# Patient Record
Sex: Female | Born: 1955
Health system: Southern US, Community
[De-identification: ages and names within clinical notes are randomized; demographics above are authoritative.]

## PROBLEM LIST (undated history)

## (undated) DIAGNOSIS — C4490 Unspecified malignant neoplasm of skin, unspecified: Secondary | ICD-10-CM

## (undated) DIAGNOSIS — I10 Essential (primary) hypertension: Secondary | ICD-10-CM

## (undated) DIAGNOSIS — E785 Hyperlipidemia, unspecified: Secondary | ICD-10-CM

## (undated) DIAGNOSIS — D649 Anemia, unspecified: Secondary | ICD-10-CM

## (undated) HISTORY — DX: Essential (primary) hypertension: I10

## (undated) HISTORY — DX: Anemia, unspecified: D64.9

## (undated) HISTORY — PX: ABDOMINAL HYSTERECTOMY: SHX81

## (undated) HISTORY — PX: KNEE SURGERY: SHX244

## (undated) HISTORY — PX: EYE SURGERY: SHX253

## (undated) HISTORY — DX: Hyperlipidemia, unspecified: E78.5

## (undated) HISTORY — DX: Unspecified malignant neoplasm of skin, unspecified: C44.90

---

## 1998-11-25 ENCOUNTER — Ambulatory Visit (HOSPITAL_COMMUNITY): Admission: RE | Admit: 1998-11-25 | Discharge: 1998-11-25 | Payer: Self-pay | Admitting: Obstetrics and Gynecology

## 1998-11-25 ENCOUNTER — Encounter: Payer: Self-pay | Admitting: Obstetrics and Gynecology

## 1999-11-28 ENCOUNTER — Encounter: Payer: Self-pay | Admitting: Obstetrics & Gynecology

## 1999-11-28 ENCOUNTER — Ambulatory Visit (HOSPITAL_COMMUNITY): Admission: RE | Admit: 1999-11-28 | Discharge: 1999-11-28 | Payer: Self-pay | Admitting: Obstetrics & Gynecology

## 2000-02-10 ENCOUNTER — Other Ambulatory Visit: Admission: RE | Admit: 2000-02-10 | Discharge: 2000-02-10 | Payer: Self-pay | Admitting: Obstetrics & Gynecology

## 2000-12-14 ENCOUNTER — Ambulatory Visit (HOSPITAL_COMMUNITY): Admission: RE | Admit: 2000-12-14 | Discharge: 2000-12-14 | Payer: Self-pay | Admitting: Obstetrics & Gynecology

## 2000-12-14 ENCOUNTER — Encounter: Payer: Self-pay | Admitting: Obstetrics & Gynecology

## 2001-02-03 ENCOUNTER — Encounter: Admission: RE | Admit: 2001-02-03 | Discharge: 2001-02-03 | Payer: Self-pay | Admitting: Urology

## 2001-02-03 ENCOUNTER — Encounter: Payer: Self-pay | Admitting: Urology

## 2001-05-24 ENCOUNTER — Other Ambulatory Visit: Admission: RE | Admit: 2001-05-24 | Discharge: 2001-05-24 | Payer: Self-pay | Admitting: Family Medicine

## 2001-09-30 ENCOUNTER — Encounter (INDEPENDENT_AMBULATORY_CARE_PROVIDER_SITE_OTHER): Payer: Self-pay | Admitting: Specialist

## 2001-09-30 ENCOUNTER — Ambulatory Visit (HOSPITAL_COMMUNITY): Admission: RE | Admit: 2001-09-30 | Discharge: 2001-09-30 | Payer: Self-pay | Admitting: Urology

## 2001-12-19 ENCOUNTER — Ambulatory Visit (HOSPITAL_COMMUNITY): Admission: RE | Admit: 2001-12-19 | Discharge: 2001-12-19 | Payer: Self-pay | Admitting: Obstetrics & Gynecology

## 2001-12-19 ENCOUNTER — Encounter: Payer: Self-pay | Admitting: Obstetrics & Gynecology

## 2002-03-09 ENCOUNTER — Encounter (INDEPENDENT_AMBULATORY_CARE_PROVIDER_SITE_OTHER): Payer: Self-pay

## 2002-03-09 ENCOUNTER — Observation Stay (HOSPITAL_COMMUNITY): Admission: RE | Admit: 2002-03-09 | Discharge: 2002-03-10 | Payer: Self-pay | Admitting: Obstetrics & Gynecology

## 2002-12-14 ENCOUNTER — Other Ambulatory Visit: Admission: RE | Admit: 2002-12-14 | Discharge: 2002-12-14 | Payer: Self-pay | Admitting: Obstetrics & Gynecology

## 2003-04-30 ENCOUNTER — Encounter: Payer: Self-pay | Admitting: Emergency Medicine

## 2003-04-30 ENCOUNTER — Emergency Department (HOSPITAL_COMMUNITY): Admission: EM | Admit: 2003-04-30 | Discharge: 2003-05-01 | Payer: Self-pay | Admitting: Emergency Medicine

## 2003-12-17 ENCOUNTER — Other Ambulatory Visit: Admission: RE | Admit: 2003-12-17 | Discharge: 2003-12-17 | Payer: Self-pay | Admitting: Obstetrics & Gynecology

## 2005-01-14 ENCOUNTER — Other Ambulatory Visit: Admission: RE | Admit: 2005-01-14 | Discharge: 2005-01-14 | Payer: Self-pay | Admitting: Obstetrics & Gynecology

## 2006-04-07 ENCOUNTER — Ambulatory Visit: Payer: Self-pay | Admitting: Family Medicine

## 2006-07-05 ENCOUNTER — Ambulatory Visit: Payer: Self-pay | Admitting: Family Medicine

## 2006-08-11 ENCOUNTER — Ambulatory Visit: Payer: Self-pay | Admitting: Family Medicine

## 2006-10-27 DIAGNOSIS — I10 Essential (primary) hypertension: Secondary | ICD-10-CM | POA: Insufficient documentation

## 2006-10-27 DIAGNOSIS — G4700 Insomnia, unspecified: Secondary | ICD-10-CM | POA: Insufficient documentation

## 2006-10-27 DIAGNOSIS — D649 Anemia, unspecified: Secondary | ICD-10-CM | POA: Insufficient documentation

## 2006-10-27 DIAGNOSIS — E785 Hyperlipidemia, unspecified: Secondary | ICD-10-CM | POA: Insufficient documentation

## 2006-11-08 ENCOUNTER — Ambulatory Visit: Payer: Self-pay | Admitting: Family Medicine

## 2006-11-08 LAB — CONVERTED CEMR LAB
ALT: 20 units/L (ref 0–40)
AST: 27 units/L (ref 0–37)
Cholesterol: 248 mg/dL (ref 0–200)
Direct LDL: 163.8 mg/dL
HDL: 66.9 mg/dL (ref >39.0)
Total CHOL/HDL Ratio: 3.7
Triglycerides: 164 mg/dL — ABNORMAL HIGH (ref 0–149)
VLDL: 33 mg/dL (ref 0–40)

## 2007-01-18 ENCOUNTER — Ambulatory Visit: Payer: Self-pay | Admitting: Family Medicine

## 2007-01-18 LAB — CONVERTED CEMR LAB
ALT: 25 units/L (ref 0–40)
AST: 29 units/L (ref 0–37)
Cholesterol: 188 mg/dL (ref 0–200)
HDL: 63.5 mg/dL (ref >39.0)
LDL Cholesterol: 90 mg/dL (ref 0–99)
Total CHOL/HDL Ratio: 3
Triglycerides: 175 mg/dL — ABNORMAL HIGH (ref 0–149)
VLDL: 35 mg/dL (ref 0–40)

## 2007-03-16 ENCOUNTER — Ambulatory Visit: Payer: Self-pay | Admitting: Family Medicine

## 2007-03-17 ENCOUNTER — Telehealth (INDEPENDENT_AMBULATORY_CARE_PROVIDER_SITE_OTHER): Payer: Self-pay | Admitting: *Deleted

## 2007-03-17 LAB — CONVERTED CEMR LAB
ALT: 18 units/L (ref 0–40)
AST: 25 units/L (ref 0–37)
BUN: 18 mg/dL (ref 6–23)
CO2: 29 meq/L (ref 19–32)
Calcium: 9.2 mg/dL (ref 8.4–10.5)
Chloride: 107 meq/L (ref 96–112)
Cholesterol: 203 mg/dL (ref 0–200)
Creatinine, Ser: 0.6 mg/dL (ref 0.4–1.2)
Direct LDL: 104.1 mg/dL
GFR calc Af Amer: 136 mL/min
GFR calc non Af Amer: 112 mL/min
Glucose, Bld: 84 mg/dL (ref 70–99)
HDL: 63.6 mg/dL (ref >39.0)
Potassium: 4 meq/L (ref 3.5–5.1)
Sodium: 142 meq/L (ref 135–145)
Total CHOL/HDL Ratio: 3.2
Triglycerides: 151 mg/dL — ABNORMAL HIGH (ref 0–149)
VLDL: 30 mg/dL (ref 0–40)

## 2007-07-13 ENCOUNTER — Ambulatory Visit: Payer: Self-pay | Admitting: Family Medicine

## 2007-07-26 ENCOUNTER — Ambulatory Visit: Payer: Self-pay | Admitting: Family Medicine

## 2007-08-08 ENCOUNTER — Telehealth (INDEPENDENT_AMBULATORY_CARE_PROVIDER_SITE_OTHER): Payer: Self-pay | Admitting: *Deleted

## 2007-11-11 ENCOUNTER — Telehealth (INDEPENDENT_AMBULATORY_CARE_PROVIDER_SITE_OTHER): Payer: Self-pay | Admitting: *Deleted

## 2007-11-16 ENCOUNTER — Ambulatory Visit: Payer: Self-pay | Admitting: Family Medicine

## 2007-11-16 ENCOUNTER — Encounter (INDEPENDENT_AMBULATORY_CARE_PROVIDER_SITE_OTHER): Payer: Self-pay | Admitting: *Deleted

## 2007-11-16 LAB — CONVERTED CEMR LAB
ALT: 17 units/L (ref 0–35)
AST: 22 units/L (ref 0–37)
BUN: 15 mg/dL (ref 6–23)
CO2: 27 meq/L (ref 19–32)
Calcium: 8.9 mg/dL (ref 8.4–10.5)
Chloride: 105 meq/L (ref 96–112)
Cholesterol: 171 mg/dL (ref 0–200)
Creatinine, Ser: 0.7 mg/dL (ref 0.4–1.2)
GFR calc Af Amer: 113 mL/min
GFR calc non Af Amer: 93 mL/min
Glucose, Bld: 88 mg/dL (ref 70–99)
HDL: 60.2 mg/dL (ref >39.0)
LDL Cholesterol: 82 mg/dL (ref 0–99)
Potassium: 3.9 meq/L (ref 3.5–5.1)
Sodium: 139 meq/L (ref 135–145)
Total CHOL/HDL Ratio: 2.8
Triglycerides: 143 mg/dL (ref 0–149)
VLDL: 29 mg/dL (ref 0–40)

## 2007-11-22 ENCOUNTER — Telehealth (INDEPENDENT_AMBULATORY_CARE_PROVIDER_SITE_OTHER): Payer: Self-pay | Admitting: *Deleted

## 2007-12-14 ENCOUNTER — Encounter (INDEPENDENT_AMBULATORY_CARE_PROVIDER_SITE_OTHER): Payer: Self-pay | Admitting: *Deleted

## 2008-03-21 ENCOUNTER — Ambulatory Visit: Payer: Self-pay | Admitting: Family Medicine

## 2008-03-27 LAB — CONVERTED CEMR LAB
ALT: 18 units/L (ref 0–35)
AST: 24 units/L (ref 0–37)
Albumin: 3.7 g/dL (ref 3.5–5.2)
Alkaline Phosphatase: 55 units/L (ref 39–117)
BUN: 18 mg/dL (ref 6–23)
Basophils Absolute: 0 10*3/uL (ref 0.0–0.1)
Basophils Relative: 0.9 % (ref 0.0–1.0)
Bilirubin, Direct: 0.1 mg/dL (ref 0.0–0.3)
CO2: 27 meq/L (ref 19–32)
Calcium: 8.8 mg/dL (ref 8.4–10.5)
Chloride: 103 meq/L (ref 96–112)
Creatinine, Ser: 0.6 mg/dL (ref 0.4–1.2)
Eosinophils Absolute: 0.1 10*3/uL (ref 0.0–0.7)
Eosinophils Relative: 1.7 % (ref 0.0–5.0)
Ferritin: 75.7 ng/mL (ref 10.0–291.0)
GFR calc Af Amer: 135 mL/min
GFR calc non Af Amer: 112 mL/min
Glucose, Bld: 86 mg/dL (ref 70–99)
HCT: 34.8 % — ABNORMAL LOW (ref 36.0–46.0)
Hemoglobin: 12.1 g/dL (ref 12.0–15.0)
Iron: 85 ug/dL (ref 42–145)
Lymphocytes Relative: 32.8 % (ref 12.0–46.0)
MCHC: 34.7 g/dL (ref 30.0–36.0)
MCV: 95 fL (ref 78.0–100.0)
Monocytes Absolute: 0.4 10*3/uL (ref 0.1–1.0)
Monocytes Relative: 9.3 % (ref 3.0–12.0)
Neutro Abs: 2.5 10*3/uL (ref 1.4–7.7)
Neutrophils Relative %: 55.3 % (ref 43.0–77.0)
Platelets: 255 10*3/uL (ref 150–400)
Potassium: 3.9 meq/L (ref 3.5–5.1)
RBC: 3.66 M/uL — ABNORMAL LOW (ref 3.87–5.11)
RDW: 12.6 % (ref 11.5–14.6)
Saturation Ratios: 19.9 % — ABNORMAL LOW (ref 20.0–50.0)
Sodium: 138 meq/L (ref 135–145)
Total Bilirubin: 0.7 mg/dL (ref 0.3–1.2)
Total Protein: 7 g/dL (ref 6.0–8.3)
Transferrin: 304.4 mg/dL (ref >=212.0)
WBC: 4.4 10*3/uL — ABNORMAL LOW (ref 4.5–10.5)

## 2008-03-28 ENCOUNTER — Encounter (INDEPENDENT_AMBULATORY_CARE_PROVIDER_SITE_OTHER): Payer: Self-pay | Admitting: *Deleted

## 2008-07-20 ENCOUNTER — Ambulatory Visit: Payer: Self-pay | Admitting: Family Medicine

## 2008-11-01 ENCOUNTER — Telehealth (INDEPENDENT_AMBULATORY_CARE_PROVIDER_SITE_OTHER): Payer: Self-pay | Admitting: *Deleted

## 2008-12-10 ENCOUNTER — Telehealth (INDEPENDENT_AMBULATORY_CARE_PROVIDER_SITE_OTHER): Payer: Self-pay | Admitting: *Deleted

## 2008-12-17 ENCOUNTER — Ambulatory Visit: Payer: Self-pay | Admitting: Family Medicine

## 2008-12-17 DIAGNOSIS — J019 Acute sinusitis, unspecified: Secondary | ICD-10-CM | POA: Insufficient documentation

## 2008-12-20 ENCOUNTER — Telehealth (INDEPENDENT_AMBULATORY_CARE_PROVIDER_SITE_OTHER): Payer: Self-pay | Admitting: *Deleted

## 2009-01-29 ENCOUNTER — Ambulatory Visit: Payer: Self-pay | Admitting: Family Medicine

## 2009-02-04 ENCOUNTER — Encounter (INDEPENDENT_AMBULATORY_CARE_PROVIDER_SITE_OTHER): Payer: Self-pay | Admitting: *Deleted

## 2009-03-22 ENCOUNTER — Ambulatory Visit: Payer: Self-pay | Admitting: Family Medicine

## 2009-03-22 DIAGNOSIS — IMO0002 Reserved for concepts with insufficient information to code with codable children: Secondary | ICD-10-CM | POA: Insufficient documentation

## 2010-01-27 ENCOUNTER — Ambulatory Visit: Payer: Self-pay | Admitting: Family Medicine

## 2010-01-27 DIAGNOSIS — Z85828 Personal history of other malignant neoplasm of skin: Secondary | ICD-10-CM | POA: Insufficient documentation

## 2010-01-28 ENCOUNTER — Telehealth (INDEPENDENT_AMBULATORY_CARE_PROVIDER_SITE_OTHER): Payer: Self-pay | Admitting: *Deleted

## 2010-02-26 ENCOUNTER — Encounter: Payer: Self-pay | Admitting: Family Medicine

## 2010-05-13 ENCOUNTER — Ambulatory Visit: Payer: Self-pay | Admitting: Family Medicine

## 2010-05-14 LAB — CONVERTED CEMR LAB
AST: 28 units/L (ref 0–37)
Albumin: 3.9 g/dL (ref 3.5–5.2)
HDL: 63.4 mg/dL (ref >39.00)
LDL Cholesterol: 66 mg/dL (ref 0–99)
Total CHOL/HDL Ratio: 3
Total Protein: 6.8 g/dL (ref 6.0–8.3)
Triglycerides: 166 mg/dL — ABNORMAL HIGH (ref 0.0–149.0)

## 2010-10-26 LAB — CONVERTED CEMR LAB
AST: 25 units/L (ref 0–37)
Alkaline Phosphatase: 52 units/L (ref 39–117)
Alkaline Phosphatase: 67 units/L (ref 39–117)
BUN: 22 mg/dL (ref 6–23)
Basophils Absolute: 0 10*3/uL (ref 0.0–0.1)
Basophils Relative: 0.4 % (ref 0.0–3.0)
Bilirubin Urine: NEGATIVE
Bilirubin, Direct: 0 mg/dL (ref 0.0–0.3)
CO2: 28 meq/L (ref 19–32)
Calcium: 8.9 mg/dL (ref 8.4–10.5)
Calcium: 9.1 mg/dL (ref 8.4–10.5)
Cholesterol: 207 mg/dL — ABNORMAL HIGH (ref 0–200)
Creatinine, Ser: 0.7 mg/dL (ref 0.4–1.2)
Eosinophils Absolute: 0.1 10*3/uL (ref 0.0–0.7)
GFR calc non Af Amer: 110.51 mL/min (ref >60)
GFR calc non Af Amer: 92.85 mL/min (ref >60)
Glucose, Bld: 79 mg/dL (ref 70–99)
Glucose, Urine, Semiquant: NEGATIVE
HDL: 75.1 mg/dL (ref >39.00)
Ketones, urine, test strip: NEGATIVE
LDL Cholesterol: 80 mg/dL (ref 0–99)
Lymphocytes Relative: 26.5 % (ref 12.0–46.0)
Lymphocytes Relative: 32.4 % (ref 12.0–46.0)
MCHC: 34.6 g/dL (ref 30.0–36.0)
Monocytes Relative: 8.1 % (ref 3.0–12.0)
Monocytes Relative: 9.4 % (ref 3.0–12.0)
Neutrophils Relative %: 63.4 % (ref 43.0–77.0)
Pap Smear: NORMAL
Platelets: 276 10*3/uL (ref 150.0–400.0)
RBC: 3.78 M/uL — ABNORMAL LOW (ref 3.87–5.11)
RDW: 13.2 % (ref 11.5–14.6)
Sodium: 142 meq/L (ref 135–145)
Specific Gravity, Urine: <1.005
TSH: 1.48 microintl units/mL (ref 0.35–5.50)
Total Bilirubin: 0.4 mg/dL (ref 0.3–1.2)
Total CHOL/HDL Ratio: 2
Total CHOL/HDL Ratio: 3
Total Protein: 7.2 g/dL (ref 6.0–8.3)
Triglycerides: 120 mg/dL (ref 0.0–149.0)
Triglycerides: 156 mg/dL — ABNORMAL HIGH (ref 0.0–149.0)
VLDL: 24 mg/dL (ref 0.0–40.0)
VLDL: 31.2 mg/dL (ref 0.0–40.0)
Vitamin B-12: 524 pg/mL (ref 211–911)
WBC: 5.3 10*3/uL (ref 4.5–10.5)
pH: 6.5

## 2010-10-28 NOTE — Consult Note (Signed)
Summary: [facility]  [facility]   Imported By: Lanelle Bal 03/07/2010 12:11:01  _____________________________________________________________________  External Attachment:    Type:   Image     Comment:   External Document

## 2010-10-28 NOTE — Progress Notes (Signed)
Summary: Lab Results   Phone Note Outgoing Call   Call placed by: Army Fossa CMA,  Jan 28, 2010 9:41 AM Summary of Call: Regarding lab results, LMTCB:  ldl goal< 100---  increase lipitor 40 mg #30  1 by mouth at bedtime  , 2 refills recheck 3 months----hep, lipid 272.4 Signed by Loreen Freud DO on 01/27/2010 at 9:10 PM  Follow-up for Phone Call        Patient is aware of lab results and wants the new rx to be sent to [facility] on [address].  Follow-up by: Harold Barban,  Jan 29, 2010 8:45 AM    New/Updated Medications: LIPITOR 40 MG TABS (ATORVASTATIN CALCIUM) 1 by mouth at bedtime. Prescriptions: LIPITOR 40 MG TABS (ATORVASTATIN CALCIUM) 1 by mouth at bedtime.  #30 x 2   Entered by:   Army Fossa CMA   Authorized by:   Loreen Freud DO   Signed by:   Army Fossa CMA on 01/29/2010   Method used:   Electronically to        [facility] [phone]* (retail)       [address]       [place], [place]  [address]       Ph: [phone]       Fax: [phone]   RxID:   [id number]

## 2010-10-28 NOTE — Assessment & Plan Note (Signed)
Summary: cpx/lab /cbs   Vital Signs:  Patient profile:   55 year old female Height:      65 inches Weight:      148.13 pounds BMI:     24.74 Pulse rate:   66 / minute Pulse rhythm:   regular BP sitting:   120 / 64  (left arm) Cuff size:   regular  Vitals Entered By: Army Fossa CMA (Jan 27, 2010 9:51 AM) CC: Pt here for CPX, labs, no pap   History of Present Illness: Pt here cpe and labs.  no pap--pt sees Dr Ishmael Holter.   No complaints.    Preventive Screening-Counseling & Management  Alcohol-Tobacco     Alcohol drinks/day: 1     Alcohol type: wine     >5/day in last 3 mos: no     Alcohol Counseling: not indicated; use of alcohol is not excessive or problematic     Smoking Status: never  Caffeine-Diet-Exercise     Caffeine use/day: 1     Caffeine Counseling: not indicated; caffeine use is not excessive or problematic     Does Patient Exercise: yes     Type of exercise: walking 4 miles or bike and weights     Exercise (avg: min/session): 30-60     Times/week: 7     Exercise Counseling: not indicated; exercise is adequate  Hep-HIV-STD-Contraception     Dental Visit-last 6 months yes     Dental Care Counseling: not indicated; dental care within six months     SBE monthly: yes     SBE Education/Counseling: not applicable     Sun Exposure-Excessive: no  Safety-Violence-Falls     Seat Belt Use: yes     Firearms in the Home: firearms in the home     Firearm Counseling: not indicated; uses recommended firearm safety measures     Smoke Detectors: yes     Violence in the Home: no risk noted     Sexual Abuse: no  Current Medications (verified): 1)  Vitamin C & E Combination 500-400 Mg-Unit Caps (Vitamins C E) .... Take 2)  Metoprolol Tartrate 50 Mg  Tabs (Metoprolol Tartrate) .... Take One Tablet Twice Daily and Monitor Blood Pressure, If Any Problems Seek Medical Attn. 3)  Lipitor 20 Mg  Tabs (Atorvastatin Calcium) .Marland Kitchen.. 1 By Mouth Qd 4)  Estradiol   Powd (Estradiol) 5)   Co Q-10 Vitamin E Fish Oil 60-90-25-200  Caps (Dha-Epa-Coenzyme Q10-Vitamin E) 6)  Excedrin Pm 500-38 Mg  Tabs (Diphenhydramine-Apap (Sleep)) 7)  Vitamin D 400 Unit  Caps (Cholecalciferol) 8)  Niacin 500 Mg Tabs (Niacin) .... By Mouth Once Daily 9)  Fish Oil 10)  Aspir-Low 81 Mg Tbec (Aspirin) .... Daily.  Allergies (verified): No Known Drug Allergies  Past History:  Past Surgical History: Last updated: 03/16/2007 Hysterectomy  knee surgery   Family History: Last updated: 01/27/2010 M--Alz dementia-- passed away 2008/03/02 Family History of CAD Female - PGF Family History of Stroke --PGF  Social History: Last updated: 01/29/2009 Occupation:  [employer]   Married Never Smoked Alcohol use-yes Drug use-no Regular exercise-yes  Risk Factors: Alcohol Use: 1 (01/27/2010) >5 drinks/d w/in last 3 months: no (01/27/2010) Caffeine Use: 1 (01/27/2010) Exercise: yes (01/27/2010)  Risk Factors: Smoking Status: never (01/27/2010)  Past Medical History: Anemia-NOS Hyperlipidemia Hypertension Skin cancer, hx of  Family History: Reviewed history from 01/29/2009 and no changes required. M--Alz dementia-- passed away 03/02/2008 Family History of CAD Female - PGF Family History of Stroke --PGF  Social History: Reviewed history from 01/29/2009 and no changes required. Occupation:  [employer]   Married Never Smoked Alcohol use-yes Drug use-no Regular exercise-yes  Review of Systems      See HPI General:  Denies chills, fatigue, fever, loss of appetite, malaise, sleep disorder, sweats, weakness, and weight loss. Eyes:  Denies blurring, discharge, double vision, eye irritation, eye pain, halos, itching, light sensitivity, red eye, vision loss-1 eye, and vision loss-both eyes; optho q1y. ENT:  Denies decreased hearing, difficulty swallowing, ear discharge, earache, hoarseness, nasal congestion, nosebleeds, postnasal drainage, ringing in ears, sinus pressure, and sore  throat. CV:  Denies bluish discoloration of lips or nails, chest pain or discomfort, difficulty breathing at night, difficulty breathing while lying down, fainting, fatigue, leg cramps with exertion, lightheadness, near fainting, palpitations, shortness of breath with exertion, swelling of feet, swelling of hands, and weight gain. Resp:  Denies chest discomfort, chest pain with inspiration, cough, coughing up blood, excessive snoring, hypersomnolence, morning headaches, pleuritic, shortness of breath, sputum productive, and wheezing. GI:  Denies abdominal pain, bloody stools, change in bowel habits, constipation, dark tarry stools, diarrhea, excessive appetite, gas, hemorrhoids, indigestion, and loss of appetite. GU:  Denies abnormal vaginal bleeding, decreased libido, discharge, dysuria, genital sores, hematuria, incontinence, nocturia, urinary frequency, and urinary hesitancy. MS:  Denies joint pain, joint redness, joint swelling, loss of strength, low back pain, mid back pain, muscle aches, muscle , cramps, muscle weakness, stiffness, and thoracic pain. Derm:  Denies changes in color of skin, changes in nail beds, dryness, excessive perspiration, flushing, hair loss, insect bite(s), itching, lesion(s), poor wound healing, and rash; Derm --Draelos -- q1y. Neuro:  Denies brief paralysis, difficulty with concentration, disturbances in coordination, falling down, headaches, inability to speak, memory loss, numbness, poor balance, seizures, sensation of room spinning, tingling, tremors, visual disturbances, and weakness. Psych:  Denies alternate hallucination ( auditory/visual), anxiety, depression, easily angered, easily tearful, irritability, mental problems, panic attacks, sense of great danger, suicidal thoughts/plans, thoughts of violence, unusual visions or sounds, and thoughts /plans of harming others. Endo:  Denies cold intolerance, excessive hunger, excessive thirst, excessive urination, heat  intolerance, polyuria, and weight change. Heme:  Denies abnormal bruising, bleeding, enlarge lymph nodes, fevers, pallor, and skin discoloration. Allergy:  Denies hives or rash, itching eyes, persistent infections, seasonal allergies, and sneezing.  Physical Exam  General:  Well-developed,well-nourished,in no acute distress; alert,appropriate and cooperative throughout examination Head:  Normocephalic and atraumatic without obvious abnormalities. No apparent alopecia or balding. Eyes:  pupils equal, pupils round, pupils reactive to light, and no injection.   Ears:  External ear exam shows no significant lesions or deformities.  Otoscopic examination reveals clear canals, tympanic membranes are intact bilaterally without bulging, retraction, inflammation or discharge. Hearing is grossly normal bilaterally. Nose:  External nasal examination shows no deformity or inflammation. Nasal mucosa are pink and moist without lesions or exudates. Mouth:  Oral mucosa and oropharynx without lesions or exudates.  Teeth in good repair. Neck:  No deformities, masses, or tenderness noted. Chest Wall:  No deformities, masses, or tenderness noted. Breasts:  gyn Lungs:  Normal respiratory effort, chest expands symmetrically. Lungs are clear to auscultation, no crackles or wheezes. Heart:  normal rate and no murmur.   Abdomen:  Bowel sounds positive,abdomen soft and non-tender without masses, organomegaly or hernias noted. Rectal:  gyn Genitalia:  gyn Msk:  normal ROM, no joint tenderness, no joint swelling, no joint warmth, no redness over joints, no joint deformities, no joint instability, and no crepitation.  Pulses:  R and L carotid,radial,femoral,dorsalis pedis and posterior tibial pulses are full and equal bilaterally Extremities:  No clubbing, cyanosis, edema, or deformity noted with normal full range of motion of all joints.   Neurologic:  No cranial nerve deficits noted. Station and gait are normal.  Plantar reflexes are down-going bilaterally. DTRs are symmetrical throughout. Sensory, motor and coordinative functions appear intact. Skin:  Intact without suspicious lesions or rashes Cervical Nodes:  No lymphadenopathy noted Axillary Nodes:  No palpable lymphadenopathy Psych:  Cognition and judgment appear intact. Alert and cooperative with normal attention span and concentration. No apparent delusions, illusions, hallucinations   Impression & Recommendations:  Problem # 1:  PREVENTIVE HEALTH CARE (ICD-V70.0)  Orders: Venipuncture (81191) TLB-Lipid Panel (80061-LIPID) TLB-B12 + Folate Pnl (47829_56213-Y86/VHQ) TLB-BMP (Basic Metabolic Panel-BMET) (80048-METABOL) TLB-CBC Platelet - w/Differential (85025-CBCD) TLB-Hepatic/Liver Function Pnl (80076-HEPATIC) TLB-TSH (Thyroid Stimulating Hormone) (84443-TSH) EKG w/ Interpretation (93000)  Problem # 2:  SKIN CANCER, HX OF (ICD-V10.83)  Orders: EKG w/ Interpretation (93000)  Problem # 3:  HYPERTENSION (ICD-401.9)  Her updated medication list for this problem includes:    Metoprolol Tartrate 50 Mg Tabs (Metoprolol tartrate) .Marland Kitchen... Take one tablet twice daily and monitor blood pressure, if any problems seek medical attn.  Orders: Venipuncture (46962) TLB-Lipid Panel (80061-LIPID) TLB-B12 + Folate Pnl (95284_13244-W10/UVO) TLB-BMP (Basic Metabolic Panel-BMET) (80048-METABOL) TLB-CBC Platelet - w/Differential (85025-CBCD) TLB-Hepatic/Liver Function Pnl (80076-HEPATIC) TLB-TSH (Thyroid Stimulating Hormone) (84443-TSH) EKG w/ Interpretation (93000)  BP today: 120/64 Prior BP: 112/58 (03/22/2009)  Labs Reviewed: K+: 4.5 (01/29/2009) Creat: : 0.7 (01/29/2009)   Chol: 179 (01/29/2009)   HDL: 75.10 (01/29/2009)   LDL: 80 (01/29/2009)   TG: 120.0 (01/29/2009)  Problem # 4:  HYPERLIPIDEMIA (ICD-272.4)  Her updated medication list for this problem includes:    Lipitor 20 Mg Tabs (Atorvastatin calcium) .Marland Kitchen... 1 by mouth qd    Niacin  500 Mg Tabs (Niacin) ..... By mouth once daily  Orders: Venipuncture (53664) TLB-Lipid Panel (80061-LIPID) TLB-B12 + Folate Pnl (40347_42595-G38/VFI) TLB-BMP (Basic Metabolic Panel-BMET) (80048-METABOL) TLB-CBC Platelet - w/Differential (85025-CBCD) TLB-Hepatic/Liver Function Pnl (80076-HEPATIC) TLB-TSH (Thyroid Stimulating Hormone) (84443-TSH) EKG w/ Interpretation (93000)  Labs Reviewed: SGOT: 28 (01/29/2009)   SGPT: 16 (01/29/2009)   HDL:75.10 (01/29/2009), 60.2 (11/16/2007)  LDL:80 (01/29/2009), 82 (11/16/2007)  Chol:179 (01/29/2009), 171 (11/16/2007)  Trig:120.0 (01/29/2009), 143 (11/16/2007)  Problem # 5:  ANEMIA-NOS (ICD-285.9)  Orders: Venipuncture (43329) TLB-Lipid Panel (80061-LIPID) TLB-B12 + Folate Pnl (51884_16606-T01/SWF) TLB-BMP (Basic Metabolic Panel-BMET) (80048-METABOL) TLB-CBC Platelet - w/Differential (85025-CBCD) TLB-Hepatic/Liver Function Pnl (80076-HEPATIC) TLB-TSH (Thyroid Stimulating Hormone) (84443-TSH) EKG w/ Interpretation (93000)  Hgb: 12.5 (01/29/2009)   Hct: 36.0 (01/29/2009)   Platelets: 269.0 (01/29/2009) RBC: 3.78 (01/29/2009)   RDW: 12.8 (01/29/2009)   WBC: 5.3 (01/29/2009) MCV: 95.2 (01/29/2009)   MCHC: 34.6 (01/29/2009) Ferritin: 75.7 (03/21/2008) Iron: 73 (01/29/2009)   % Sat: 15.7 (01/29/2009) B12: 524 (01/29/2009)   Folate: >20.0 ng/mL (01/29/2009)   TSH: 1.50 (01/29/2009)  Complete Medication List: 1)  Vitamin C & E Combination 500-400 Mg-unit Caps (Vitamins c e) .... Take 2)  Metoprolol Tartrate 50 Mg Tabs (Metoprolol tartrate) .... Take one tablet twice daily and monitor blood pressure, if any problems seek medical attn. 3)  Lipitor 20 Mg Tabs (Atorvastatin calcium) .Marland Kitchen.. 1 by mouth qd 4)  Estradiol Powd (Estradiol) 5)  Co Q-10 Vitamin E Fish Oil 60-90-25-200 Caps (Dha-epa-coenzyme q10-vitamin e) 6)  Excedrin Pm 500-38 Mg Tabs (Diphenhydramine-apap (sleep)) 7)  Vitamin D 400 Unit Caps (Cholecalciferol) 8)  Niacin 500 Mg Tabs  (Niacin) .Marland KitchenMarland KitchenMarland Kitchen  By mouth once daily 9)  Fish Oil  10)  Aspir-low 81 Mg Tbec (Aspirin) .... Daily. Prescriptions: LIPITOR 20 MG  TABS (ATORVASTATIN CALCIUM) 1 by mouth qd  #30 Tablet x 5   Entered and Authorized by:   Loreen Freud DO   Signed by:   Loreen Freud DO on 01/27/2010   Method used:   Electronically to        [facility] [phone]* (retail)       [address]       [place], [place]  [address]       Ph: [phone]       Fax: [phone]   RxID:   [id number] METOPROLOL TARTRATE 50 MG  TABS (METOPROLOL TARTRATE) Take one tablet twice daily and monitor Blood pressure, if any problems seek medical attn.  #60 Tablet x 5   Entered and Authorized by:   Loreen Freud DO   Signed by:   Loreen Freud DO on 01/27/2010   Method used:   Electronically to        [facility] [phone]* (retail)       [address]       [place], [place]  [address]       Ph: [phone]       Fax: [phone]   RxID:   [id number]                                                                                                                                             EKG  Procedure date:  01/27/2010  Findings:      Sinus bradycardia with rate of:  58  Last Flu Vaccine:  Fluvax 3+ (07/20/2008 3:23:23 PM) Flu Vaccine Next Due:  Not Indicated Flex Sig Result Date:  01/01/2010 Flex Sig Result:  int hem Hemoccult Result Date:  09/04/2009 Hemoccult Result:  normal Hemoccult Next Due:  1 yr Last PAP:  normal (09/12/2008 10:20:13 AM) PAP Result Date:  09/04/2009 PAP Result:  normal PAP Next Due:  1 yr Last Mammogram:  normal (09/12/2008 10:21:43 AM) Mammogram Result Date:  09/04/2009 Mammogram Result:  normal Mammogram Next Due:  1 yr

## 2010-12-24 ENCOUNTER — Other Ambulatory Visit: Payer: Self-pay | Admitting: Family Medicine

## 2010-12-31 ENCOUNTER — Telehealth: Payer: Self-pay | Admitting: Family Medicine

## 2010-12-31 NOTE — Telephone Encounter (Signed)
hep, lipid 272.4

## 2010-12-31 NOTE — Telephone Encounter (Signed)
Pharmacy notified pt that she was due for labs. Please provide orders. Appt has been set for 01/07/11.

## 2011-01-01 NOTE — Telephone Encounter (Signed)
Orders have been added to appt

## 2011-01-07 ENCOUNTER — Other Ambulatory Visit (INDEPENDENT_AMBULATORY_CARE_PROVIDER_SITE_OTHER): Payer: BC Managed Care – PPO

## 2011-01-07 DIAGNOSIS — E785 Hyperlipidemia, unspecified: Secondary | ICD-10-CM

## 2011-01-07 LAB — HEPATIC FUNCTION PANEL
Albumin: 3.7 g/dL (ref 3.5–5.2)
Bilirubin, Direct: 0.1 mg/dL (ref 0.0–0.3)
Total Protein: 6.7 g/dL (ref 6.0–8.3)

## 2011-01-07 LAB — LIPID PANEL
HDL: 76.4 mg/dL (ref >39.00)
Total CHOL/HDL Ratio: 3
Triglycerides: 132 mg/dL (ref 0.0–149.0)

## 2011-01-17 ENCOUNTER — Other Ambulatory Visit: Payer: Self-pay | Admitting: Family Medicine

## 2011-02-13 NOTE — Op Note (Signed)
[facility]  Patient:    Janet Knox, Janet Knox Visit Number: [id number] MRN: [id number]          Service Type: DSU Location: [id number] Attending Physician:  Minette Headland Dictated by:   Freddy Finner, M.D. Proc. Date: 03/09/02 Admit Date:  03/09/2002                             Operative Report  PREOPERATIVE DIAGNOSES:       1. Fibroids.                               2. Menorrhagia.  POSTOPERATIVE DIAGNOSES:      1. Fibroids.                               2. Menorrhagia.  OPERATIVE PROCEDURES:         1. Laparoscopically-assisted vaginal                                  hysterectomy.                               2. Bilateral salpingo-oophorectomy.  SURGEON:                      Freddy Finner, M.D.  ASSISTANT:                    Trevor Iha, M.D.  ANESTHESIA:                   General endotracheal.  ESTIMATED INTRAOPERATIVE BLOOD LOSS:                   250 cc.  INTRAOPERATIVE COMPLICATIONS:                None.  INDICATIONS:                  The patient is a 55 year old with extreme menorrhagia and dysmenorrhea.  She is known to have uterine fibroids which have increased in size.  She has requested definitive surgery and is admitted at this time for that purpose.  DESCRIPTION OF PROCEDURE:     She was admitted on the morning of surgery.  She was given a bolus of Ceftin IV preoperatively.  She was placed in PAS hose. She was taken to the operating room and there placed under adequate general endotracheal anesthesia and placed in the dorsolithotomy position using the Geneva stirrup system.  Betadine scrub and prep were then used to prep in the usual fashion.  The bladder was evacuated with the [facility] catheter.  The Hulka tenaculum was attached to the cervix.  Sterile drapes were applied.  Two small incisions were made, one at the umbilicus and one just above the symphysis.  An 11 mm disposable trocar was introduced  through the upper incision while elevating the anterior abdominal wall manually.  Direct inspection revealed adequate placement and no evidence of injury.  The pneumoperitoneum was allowed to accumulate with carbon dioxide gas.  A second trocar was placed through a lower incision under direct visualization.  A blunt probe and later  the [place] irrigation system and grasping forceps were used through this port.  Careful systematic examination of the abdominal and pelvic contents was carried out.  The findings were normal, except for fibroids.  Photographs were made and retained in the office record.  Using the grasping forceps through the lower trocar and the Hulka tenaculum to manipulate the uterus, the infundibular pelvic and broad ligaments were placed on stretch.  Using the gyro 5 mm bipolar device with cutting instrument, the infundibular pelvic, round, and broad ligaments were progressively were progressively dissected and coagulated to free the ovaries.  This was carried down to a level near the uterine artery.  Attention was then turned vaginally. The posterior weighted vaginal retractor was placed.  A colpotomy incision was made while tenting the cul-de-sac.  The cervix was circumscribed with a scalpel.  Using the gyro plasma coagulator system, progressive pedicles developed, including uterosacrals, bladder pillars, and cardinal ligaments. Each were sealed and divided sharply.  The anterior peritoneum was entered. The vesicle pedicle was protected with the gyro system, sealed, and divided. The uterus was then removed intact with the tubes and ovaries attached.  The angles of the vagina were then anchored to the uterosacrals with mattress sutures of 0 Monocryl.  The uterosacrals were plicated and posterior peritoneum closed with a 0 Vicryl interrupted suture.  The cuff was closed vertically.  A Foley catheter was placed.  Reinspection laparoscopically revealed complete hemostasis.   Irrigation solution was used and removed.  The procedure was terminated.  Gas was allowed to return from the abdomen.  The incisions were closed with interrupted subcuticular sutures of 3-0 Dexon. Plain Marcaine 0.5% was injected at the incision sites for postoperative analgesia.  Steri-Strips were applied to the lower incision.  The patient was awaken and taken to recovery in good condition. Dictated by:   Freddy Finner, M.D. Attending Physician:  Minette Headland DD:  03/09/02 TD:  03/09/02 Job: [id number]

## 2011-02-13 NOTE — Op Note (Signed)
[facility]  Patient:    Janet Knox, Janet Knox Visit Number: [id number] MRN: [id number]          Service Type: DSU Location: DAY Attending Physician:  Trisha Mangle Dictated by:   Veverly Fells Vernie Ammons, M.D. Proc. Date: 09/30/01 Admit Date:  09/30/2001                             Operative Report  PREOPERATIVE DIAGNOSIS:  Abnormal cytology.  POSTOPERATIVE DIAGNOSIS:  Abnormal cytology.  PROCEDURE: 1. Cystoscopy. 2. Bladder biopsies. 3. Bladder and bilateral renal pelvis barbotage.  SURGEON:  Mark C. Vernie Ammons, M.D.  ANESTHESIA:  General.  DRAINS:  None.  ESTIMATED BLOOD LOSS:  Less than 5 cc.  SPECIMENS:  (1) Cold cup biopsy of bladder neck. (2) Cold cup biopsy of posterior bladder wall. (3) Barbotage urine specimen from bladder. (4) Barbotage specimen from right renal pelvis. (5) Barbotage specimen from left renal pelvis.  COMPLICATIONS:  None.  INDICATIONS:  The patient is a 55 year old white female who was initially seen earlier this year for evaluation of painless microscopic hematuria. She had an IVP on Jan 31, 2001 that was normal. This was followed up by renal ultrasound on the same day, which was also normal. A CT scan was performed to further evaluate the kidneys and they were also noted to be normal with fetal lobulation noted. Cystoscopy was performed and it was normal as well. A repeat renal ultrasound on August 08, 2001 was normal. The urine cytologies, however, revealed atypical cells on August 18, 2001. Because of that, I repeated her cytology two more times. 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 This was noted on the second and third cytologies and revealed no evidence of abnormalities. Because of these abnormalities, I have discussed with the patient, the need for further  investigation as well as the risks and complications and alternatives and she has elected to proceed.  DESCRIPTION OF OPERATION:  After informed consent, the patient was brought to the main OR and placed on the table, administered general anesthesia and then moved to dorsal lithotomy position. Genitalia was sterilely prepped and draped. A 21-French cystoscope was then introduced in the bladder and the bladder was then fully inspected with both 12 and 70 degree lenses. The bladder was noted to be entirely free of any suspicious tumors, stones, or inflammatory lesions, red patches, or other abnormal areas of the bladder mucosa. The ureteral orifices are of normal configuration and position with _______ .  Sterile saline had been used throughout the procedure and I then used sterile saline and performed barbotage of the bladder in the standard fashion and sent this fluid for cytology. I then inserted the cold cup biopsy forceps and obtained a biopsy from the just inside the bladder neck region at the midline and then posteriorly in the midline behind the trigone at the floor of the bladder. The Bugbee probe was then inserted through the cystoscope and the two biopsy sites were fulgurated.  I then directed attention to the right ureteral orifice and a 6-French open-ended ureteral catheter was passed into the orifice and up the right ureter under direct fluoroscopic control. Once the stent appeared to be in the area of the renal pelvis, 10 cc of air was injected through the stent to perform a pneumopyelogram indicating the tip of  the stent was in the renal pelvic region. After confirming its location, I then used 5 cc of sterile saline to barbotage the renal pelvis vigorously. This was performed several times and the barbotage fluid was then sent for cytology from the right renal pelvis. An identical procedure was then performed on the left side after I thoroughly flushed the stent and  syringe. At the end of the procedure, there appeared to be no bleeding. The bladder appeared intact and was therefore drained. The patient received a B&O suppository per rectum prior to beginning of the procedure and 30 mg of Toradol IV at the end of the operation. She was taken to the recovery room in stable, satisfactory condition. She tolerated the procedure well with no intraoperative complications.  She will be given a prescription for Vicodin ES, #28, and Pyridium Plus, #18, and will be contacted by Dr. Vernie Ammons when I get the results of her cytology. ictated by:   Veverly Fells Vernie Ammons, M.D. Attending Physician:  Trisha Mangle DD:  09/30/01 TD:  09/30/01 Job: [id number]

## 2011-02-13 NOTE — H&P (Signed)
[facility]  Patient:    Janet Knox, Janet Knox Visit Number: [id number] MRN: [id number]          Service Type: DSU Location: [id number] Attending Physician:  Minette Headland Dictated by:   Freddy Finner, M.D. Admit Date:  03/09/2002                           History and Physical  PREOPERATIVE HISTORY AND PHYSICAL  ADMITTING DIAGNOSES: 1. Uterine leiomyomata. 2. Menorrhagia. 3. Dysmenorrhea.  HISTORY OF PRESENT ILLNESS:   The patient is a 55 year old gravida 3, para 1, who has progressively increasing uterine leiomyomata and is having severe dysmenorrhea and has requested definitive surgical intervention.  She is admitted now for a laparoscopically-assisted vaginal hysterectomy and bilateral salpingo-oophorectomy.  REVIEW OF SYSTEMS:            Cardiopulmonary, GI, GU review of systems are negative.  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  She has no other known significant medical illnesses.  PAST SURGICAL HISTORY: 1. She has had previous Cesarean delivery. 2. Previous laparoscopic tubal ligation. 3. She had knee surgery at the age of 56 approximately 18 years ago.  ALLERGIES:                    She has no known allergies to medications.  CURRENT MEDICATIONS:          She is currently on no medications, however, she is taking vitamin supplements and is taking niacin for cholesterol management.  SOCIAL HISTORY:               She uses alcohol occasionally.  She is not using cigarettes.  FAMILY HISTORY:               Noncontributory.  PHYSICAL EXAMINATION:  HEENT:                        Grossly within normal limits.  NECK:                         Thyroid gland is not palpably enlarged.  CHEST:                        Clear to auscultation.  ABDOMEN:                      Soft and nontender without appreciable organomegaly or  palpable masses.  HEART:                        Normal sinus rhythm without murmurs, rubs or gallops.  ABDOMEN:                      Soft and nontender, no appreciable organomegaly or palpable masses.  BREAST EXAM:                  Normal, no palpable masses, no skin change, no nipple discharge.  PELVIC EXAM:                  External genitalia, vagina and cervix are normal.  Bimanual reveals the uterus to be anterior position, approximately nine weeks gestational  size and irregular nodular.  The rectum is palpated normal, rectovaginal exam confirms.  EXTREMITIES:                  Without clubbing, cyanosis or edema.  ASSESSMENT:                   Uterine leiomyomata with clinical symptoms of severe dysmenorrhea and menorrhagia.  PLAN:                         Laparoscopically-assisted vaginal hysterectomy and bilateral salpingo-oophorectomy. Dictated by:   Freddy Finner, M.D. Attending Physician:  Minette Headland DD:  03/09/02 TD:  03/09/02 Job: [id number]

## 2011-02-13 NOTE — Discharge Summary (Signed)
[facility]  Patient:    Janet Knox, Janet Knox Visit Number: [id number] MRN: [id number]          Service Type: DSU Location: 9300 [id number] Attending Physician:  Minette Headland Dictated by:   Freddy Finner, M.D. Admit Date:  03/09/2002 Discharge Date: 03/10/2002                             Discharge Summary  DISCHARGE DIAGNOSES:          1. Uterine leiomyoma.                               2. Clinical history of severe menorrhagia,                                  and dysmenorrhea.  PROCEDURE:                    Laparoscopically-assisted vaginal hysterectomy and bilateral salpingo-oophorectomy.  COMPLICATIONS:                None.  DISPOSITION:                  The patient is in satisfactory improved condition at the time of her discharge.  She is to have progressively increasing physical activity, but no heavy lifting and no vaginal entry.  She is to call for fever or bleeding.  She is to take Premarin 0.625 mg a day. She is given Darvocet to be taken as needed for postoperative pain which requires Motrin which she is to take over-the-counter strength.  She is to have a regular diet.  HISTORY OF PRESENT ILLNESS: PAST MEDICAL HISTORY: FAMILY HISTORY: REVIEW OF SYSTEMS: PHYSICAL EXAMINATION:         Details are recorded in the admission note. Briefly, the physical findings were remarkable only for enlargement of the uterus which was irregularly nodular.  LABORATORY DATA:              CBC on admission with hemoglobin 12.3, hematocrit 36.7, and white count 6.2, platelet count was normal. Postoperative hemoglobin was 10.1, hematocrit 30.5, and platelet count was normal, white count was 9.0.  Admission prothrombin time, PTT, and INR were all normal.  Admission urinalysis was not done.  HOSPITAL COURSE:              The patient was admitted on the morning of surgery and treated perioperatively with IV Cefotan and PAS hose.  By the afternoon of  the first postoperative day she had remained completely afebrile. She was ambulating without difficulty, tolerating a regular diet, having adequate bowel and bladder function.  Her condition was good and she was discharged home with disposition as noted above. Dictated by:   Freddy Finner, M.D. Attending Physician:  Minette Headland DD:  03/10/02 TD:  03/13/02 Job: [id number]

## 2011-07-20 ENCOUNTER — Other Ambulatory Visit: Payer: Self-pay | Admitting: Family Medicine

## 2011-08-19 ENCOUNTER — Other Ambulatory Visit: Payer: Self-pay | Admitting: Family Medicine

## 2011-08-31 ENCOUNTER — Other Ambulatory Visit: Payer: Self-pay | Admitting: Family Medicine

## 2011-08-31 DIAGNOSIS — E785 Hyperlipidemia, unspecified: Secondary | ICD-10-CM

## 2011-09-01 ENCOUNTER — Other Ambulatory Visit (INDEPENDENT_AMBULATORY_CARE_PROVIDER_SITE_OTHER): Payer: BC Managed Care – PPO

## 2011-09-01 DIAGNOSIS — E785 Hyperlipidemia, unspecified: Secondary | ICD-10-CM

## 2011-09-01 LAB — HEPATIC FUNCTION PANEL
AST: 26 U/L (ref 0–37)
Albumin: 3.7 g/dL (ref 3.5–5.2)
Alkaline Phosphatase: 58 U/L (ref 39–117)
Bilirubin, Direct: 0 mg/dL (ref 0.0–0.3)
Total Bilirubin: 0.3 mg/dL (ref 0.3–1.2)

## 2011-09-01 LAB — LIPID PANEL
LDL Cholesterol: 74 mg/dL (ref 0–99)
Total CHOL/HDL Ratio: 3

## 2011-09-01 NOTE — Progress Notes (Signed)
12  

## 2011-09-14 MED ORDER — FENOFIBRATE 145 MG PO TABS: 145.0000 mg | ORAL_TABLET | Freq: Every day | ORAL | Status: DC

## 2011-09-17 ENCOUNTER — Other Ambulatory Visit: Payer: Self-pay | Admitting: Family Medicine

## 2011-09-18 NOTE — Telephone Encounter (Signed)
Last labs 12/4, repeat in 3 months.  RX sent until that time.

## 2011-11-12 ENCOUNTER — Telehealth: Payer: Self-pay | Admitting: *Deleted

## 2011-11-12 NOTE — Telephone Encounter (Signed)
msg left to call the office     KP 

## 2011-11-12 NOTE — Telephone Encounter (Signed)
Pt states that since adding the tricor she has been having muscle pain all over for the past 2 month. Pt indicated that she is due to have level check again in 1 month and would like to know if med can be changed or is there something else she can do to help pain.

## 2011-11-12 NOTE — Telephone Encounter (Signed)
Try coq10   200 mg daily---- also can try lower dose of tricor

## 2011-11-13 ENCOUNTER — Telehealth: Payer: Self-pay | Admitting: *Deleted

## 2011-11-13 MED ORDER — FENOFIBRATE 48 MG PO TABS: 48.0000 mg | ORAL_TABLET | Freq: Every day | ORAL | Status: DC

## 2011-11-13 NOTE — Telephone Encounter (Signed)
The only other dose is 48 mg -----labs should be scheduled 3 months from last one

## 2011-11-13 NOTE — Telephone Encounter (Signed)
Discussed with patient----- Faxed to the pharmacy     KP

## 2011-11-13 NOTE — Telephone Encounter (Signed)
Spoke to pt to advise that she has already been taking the 200mg  of coQ10 for a month, this has helped somewhat but not enough, she did say that she is willing to just lower the dosage and see how that works for her, please advise which dosage that should be sent in for pt, per currently using tricor 145mg , pt also wanted to know when her next blood draw needs to be, she had forgotten when it is to be before her call into the office yesterday, was unable to locate a note concerning the next lab dates in chart, please advise

## 2011-11-19 NOTE — Telephone Encounter (Signed)
This has been addressed.    KP

## 2011-12-10 ENCOUNTER — Other Ambulatory Visit: Payer: Self-pay | Admitting: Family Medicine

## 2011-12-18 ENCOUNTER — Other Ambulatory Visit: Payer: Self-pay | Admitting: Family Medicine

## 2012-01-16 ENCOUNTER — Other Ambulatory Visit: Payer: Self-pay | Admitting: Family Medicine

## 2012-01-18 NOTE — Telephone Encounter (Signed)
Apt pending 02/02/2012

## 2012-02-06 ENCOUNTER — Other Ambulatory Visit: Payer: Self-pay | Admitting: Family Medicine

## 2012-02-12 ENCOUNTER — Encounter: Payer: Self-pay | Admitting: Family Medicine

## 2012-02-12 ENCOUNTER — Ambulatory Visit (INDEPENDENT_AMBULATORY_CARE_PROVIDER_SITE_OTHER): Payer: BC Managed Care – PPO | Admitting: Family Medicine

## 2012-02-12 VITALS — BP 114/64 | HR 64 | Temp 98.2°F | Ht 64.5 in | Wt 142.6 lb

## 2012-02-12 DIAGNOSIS — I10 Essential (primary) hypertension: Secondary | ICD-10-CM

## 2012-02-12 DIAGNOSIS — E785 Hyperlipidemia, unspecified: Secondary | ICD-10-CM

## 2012-02-12 DIAGNOSIS — Z0000 Encounter for general adult medical examination without abnormal findings: Secondary | ICD-10-CM

## 2012-02-12 LAB — LIPID PANEL
HDL: 59.8 mg/dL (ref >39.00)
Triglycerides: 80 mg/dL (ref 0.0–149.0)
VLDL: 16 mg/dL (ref 0.0–40.0)

## 2012-02-12 LAB — HEPATIC FUNCTION PANEL
Albumin: 3.8 g/dL (ref 3.5–5.2)
Alkaline Phosphatase: 54 U/L (ref 39–117)
Bilirubin, Direct: 0 mg/dL (ref 0.0–0.3)

## 2012-02-12 LAB — CBC WITH DIFFERENTIAL/PLATELET
Basophils Absolute: 0 10*3/uL (ref 0.0–0.1)
Eosinophils Absolute: 0.1 10*3/uL (ref 0.0–0.7)
Lymphocytes Relative: 32.6 % (ref 12.0–46.0)
MCHC: 33.5 g/dL (ref 30.0–36.0)
Neutro Abs: 2.2 10*3/uL (ref 1.4–7.7)
Neutrophils Relative %: 54.9 % (ref 43.0–77.0)
RDW: 13.5 % (ref 11.5–14.6)

## 2012-02-12 LAB — BASIC METABOLIC PANEL
CO2: 25 mEq/L (ref 19–32)
Calcium: 9 mg/dL (ref 8.4–10.5)
Creatinine, Ser: 0.7 mg/dL (ref 0.4–1.2)
Glucose, Bld: 86 mg/dL (ref 70–99)

## 2012-02-12 MED ORDER — METOPROLOL TARTRATE 50 MG PO TABS: 50.0000 mg | ORAL_TABLET | Freq: Two times a day (BID) | ORAL | Status: DC

## 2012-02-12 MED ORDER — FENOFIBRATE 48 MG PO TABS: 48.0000 mg | ORAL_TABLET | Freq: Every day | ORAL | Status: DC

## 2012-02-12 MED ORDER — ATORVASTATIN CALCIUM 40 MG PO TABS: ORAL_TABLET | ORAL | Status: DC

## 2012-02-12 NOTE — Patient Instructions (Signed)
Preventive Care for Adults, Female A healthy lifestyle and preventive care can promote health and wellness. Preventive health guidelines for women include the following key practices.  A routine yearly physical is a good way to check with your caregiver about your health and preventive screening. It is a chance to share any concerns and updates on your health, and to receive a thorough exam.   Visit your dentist for a routine exam and preventive care every 6 months. Brush your teeth twice a day and floss once a day. Good oral hygiene prevents tooth decay and gum disease.   The frequency of eye exams is based on your age, health, family medical history, use of contact lenses, and other factors. Follow your caregiver's recommendations for frequency of eye exams.   Eat a healthy diet. Foods like vegetables, fruits, whole grains, low-fat dairy products, and lean protein foods contain the nutrients you need without too many calories. Decrease your intake of foods high in solid fats, added sugars, and salt. Eat the right amount of calories for you.Get information about a proper diet from your caregiver, if necessary.   Regular physical exercise is one of the most important things you can do for your health. Most adults should get at least 150 minutes of moderate-intensity exercise (any activity that increases your heart rate and causes you to sweat) each week. In addition, most adults need muscle-strengthening exercises on 2 or more days a week.   Maintain a healthy weight. The body mass index (BMI) is a screening tool to identify possible weight problems. It provides an estimate of body fat based on height and weight. Your caregiver can help determine your BMI, and can help you achieve or maintain a healthy weight.For adults 20 years and older:   A BMI below 18.5 is considered underweight.   A BMI of 18.5 to 24.9 is normal.   A BMI of 25 to 29.9 is considered overweight.   A BMI of 30 and above is  considered obese.   Maintain normal blood lipids and cholesterol levels by exercising and minimizing your intake of saturated fat. Eat a balanced diet with plenty of fruit and vegetables. Blood tests for lipids and cholesterol should begin at age 20 and be repeated every 5 years. If your lipid or cholesterol levels are high, you are over 50, or you are at high risk for heart disease, you may need your cholesterol levels checked more frequently.Ongoing high lipid and cholesterol levels should be treated with medicines if diet and exercise are not effective.   If you smoke, find out from your caregiver how to quit. If you do not use tobacco, do not start.   If you are pregnant, do not drink alcohol. If you are breastfeeding, be very cautious about drinking alcohol. If you are not pregnant and choose to drink alcohol, do not exceed 1 drink per day. One drink is considered to be 12 ounces (355 mL) of beer, 5 ounces (148 mL) of wine, or 1.5 ounces (44 mL) of liquor.   Avoid use of street drugs. Do not share needles with anyone. Ask for help if you need support or instructions about stopping the use of drugs.   High blood pressure causes heart disease and increases the risk of stroke. Your blood pressure should be checked at least every 1 to 2 years. Ongoing high blood pressure should be treated with medicines if weight loss and exercise are not effective.   If you are 55 to 57   years old, ask your caregiver if you should take aspirin to prevent strokes.   Diabetes screening involves taking a blood sample to check your fasting blood sugar level. This should be done once every 3 years, after age 45, if you are within normal weight and without risk factors for diabetes. Testing should be considered at a younger age or be carried out more frequently if you are overweight and have at least 1 risk factor for diabetes.   Breast cancer screening is essential preventive care for women. You should practice "breast  self-awareness." This means understanding the normal appearance and feel of your breasts and may include breast self-examination. Any changes detected, no matter how small, should be reported to a caregiver. Women in their 20s and 30s should have a clinical breast exam (CBE) by a caregiver as part of a regular health exam every 1 to 3 years. After age 40, women should have a CBE every year. Starting at age 40, women should consider having a mammography (breast X-ray test) every year. Women who have a family history of breast cancer should talk to their caregiver about genetic screening. Women at a high risk of breast cancer should talk to their caregivers about having magnetic resonance imaging (MRI) and a mammography every year.   The Pap test is a screening test for cervical cancer. A Pap test can show cell changes on the cervix that might become cervical cancer if left untreated. A Pap test is a procedure in which cells are obtained and examined from the lower end of the uterus (cervix).   Women should have a Pap test starting at age 21.   Between ages 21 and 29, Pap tests should be repeated every 2 years.   Beginning at age 30, you should have a Pap test every 3 years as long as the past 3 Pap tests have been normal.   Some women have medical problems that increase the chance of getting cervical cancer. Talk to your caregiver about these problems. It is especially important to talk to your caregiver if a new problem develops soon after your last Pap test. In these cases, your caregiver may recommend more frequent screening and Pap tests.   The above recommendations are the same for women who have or have not gotten the vaccine for human papillomavirus (HPV).   If you had a hysterectomy for a problem that was not cancer or a condition that could lead to cancer, then you no longer need Pap tests. Even if you no longer need a Pap test, a regular exam is a good idea to make sure no other problems are  starting.   If you are between ages 65 and 70, and you have had normal Pap tests going back 10 years, you no longer need Pap tests. Even if you no longer need a Pap test, a regular exam is a good idea to make sure no other problems are starting.   If you have had past treatment for cervical cancer or a condition that could lead to cancer, you need Pap tests and screening for cancer for at least 20 years after your treatment.   If Pap tests have been discontinued, risk factors (such as a new sexual partner) need to be reassessed to determine if screening should be resumed.   The HPV test is an additional test that may be used for cervical cancer screening. The HPV test looks for the virus that can cause the cell changes on the cervix.   The cells collected during the Pap test can be tested for HPV. The HPV test could be used to screen women aged 30 years and older, and should be used in women of any age who have unclear Pap test results. After the age of 30, women should have HPV testing at the same frequency as a Pap test.   Colorectal cancer can be detected and often prevented. Most routine colorectal cancer screening begins at the age of 50 and continues through age 75. However, your caregiver may recommend screening at an earlier age if you have risk factors for colon cancer. On a yearly basis, your caregiver may provide home test kits to check for hidden blood in the stool. Use of a small camera at the end of a tube, to directly examine the colon (sigmoidoscopy or colonoscopy), can detect the earliest forms of colorectal cancer. Talk to your caregiver about this at age 50, when routine screening begins. Direct examination of the colon should be repeated every 5 to 10 years through age 75, unless early forms of pre-cancerous polyps or small growths are found.   Hepatitis C blood testing is recommended for all people born from 1945 through 1965 and any individual with known risks for hepatitis C.    Practice safe sex. Use condoms and avoid high-risk sexual practices to reduce the spread of sexually transmitted infections (STIs). STIs include gonorrhea, chlamydia, syphilis, trichomonas, herpes, HPV, and human immunodeficiency virus (HIV). Herpes, HIV, and HPV are viral illnesses that have no cure. They can result in disability, cancer, and death. Sexually active women aged 25 and younger should be checked for chlamydia. Older women with new or multiple partners should also be tested for chlamydia. Testing for other STIs is recommended if you are sexually active and at increased risk.   Osteoporosis is a disease in which the bones lose minerals and strength with aging. This can result in serious bone fractures. The risk of osteoporosis can be identified using a bone density scan. Women ages 65 and over and women at risk for fractures or osteoporosis should discuss screening with their caregivers. Ask your caregiver whether you should take a calcium supplement or vitamin D to reduce the rate of osteoporosis.   Menopause can be associated with physical symptoms and risks. Hormone replacement therapy is available to decrease symptoms and risks. You should talk to your caregiver about whether hormone replacement therapy is right for you.   Use sunscreen with sun protection factor (SPF) of 30 or more. Apply sunscreen liberally and repeatedly throughout the day. You should seek shade when your shadow is shorter than you. Protect yourself by wearing long sleeves, pants, a wide-brimmed hat, and sunglasses year round, whenever you are outdoors.   Once a month, do a whole body skin exam, using a mirror to look at the skin on your back. Notify your caregiver of new moles, moles that have irregular borders, moles that are larger than a pencil eraser, or moles that have changed in shape or color.   Stay current with required immunizations.   Influenza. You need a dose every fall (or winter). The composition of  the flu vaccine changes each year, so being vaccinated once is not enough.   Pneumococcal polysaccharide. You need 1 to 2 doses if you smoke cigarettes or if you have certain chronic medical conditions. You need 1 dose at age 65 (or older) if you have never been vaccinated.   Tetanus, diphtheria, pertussis (Tdap, Td). Get 1 dose of   Tdap vaccine if you are younger than age 65, are over 65 and have contact with an infant, are a healthcare worker, are pregnant, or simply want to be protected from whooping cough. After that, you need a Td booster dose every 10 years. Consult your caregiver if you have not had at least 3 tetanus and diphtheria-containing shots sometime in your life or have a deep or dirty wound.   HPV. You need this vaccine if you are a woman age 26 or younger. The vaccine is given in 3 doses over 6 months.   Measles, mumps, rubella (MMR). You need at least 1 dose of MMR if you were born in 1957 or later. You may also need a second dose.   Meningococcal. If you are age 19 to 21 and a first-year college student living in a residence hall, or have one of several medical conditions, you need to get vaccinated against meningococcal disease. You may also need additional booster doses.   Zoster (shingles). If you are age 60 or older, you should get this vaccine.   Varicella (chickenpox). If you have never had chickenpox or you were vaccinated but received only 1 dose, talk to your caregiver to find out if you need this vaccine.   Hepatitis A. You need this vaccine if you have a specific risk factor for hepatitis A virus infection or you simply wish to be protected from this disease. The vaccine is usually given as 2 doses, 6 to 18 months apart.   Hepatitis B. You need this vaccine if you have a specific risk factor for hepatitis B virus infection or you simply wish to be protected from this disease. The vaccine is given in 3 doses, usually over 6 months.  Preventive Services /  Frequency Ages 19 to 39  Blood pressure check.** / Every 1 to 2 years.   Lipid and cholesterol check.** / Every 5 years beginning at age 20.   Clinical breast exam.** / Every 3 years for women in their 20s and 30s.   Pap test.** / Every 2 years from ages 21 through 29. Every 3 years starting at age 30 through age 65 or 70 with a history of 3 consecutive normal Pap tests.   HPV screening.** / Every 3 years from ages 30 through ages 65 to 70 with a history of 3 consecutive normal Pap tests.   Hepatitis C blood test.** / For any individual with known risks for hepatitis C.   Skin self-exam. / Monthly.   Influenza immunization.** / Every year.   Pneumococcal polysaccharide immunization.** / 1 to 2 doses if you smoke cigarettes or if you have certain chronic medical conditions.   Tetanus, diphtheria, pertussis (Tdap, Td) immunization. / A one-time dose of Tdap vaccine. After that, you need a Td booster dose every 10 years.   HPV immunization. / 3 doses over 6 months, if you are 26 and younger.   Measles, mumps, rubella (MMR) immunization. / You need at least 1 dose of MMR if you were born in 1957 or later. You may also need a second dose.   Meningococcal immunization. / 1 dose if you are age 19 to 21 and a first-year college student living in a residence hall, or have one of several medical conditions, you need to get vaccinated against meningococcal disease. You may also need additional booster doses.   Varicella immunization.** / Consult your caregiver.   Hepatitis A immunization.** / Consult your caregiver. 2 doses, 6 to 18 months   apart.   Hepatitis B immunization.** / Consult your caregiver. 3 doses usually over 6 months.  Ages 40 to 64  Blood pressure check.** / Every 1 to 2 years.   Lipid and cholesterol check.** / Every 5 years beginning at age 20.   Clinical breast exam.** / Every year after age 40.   Mammogram.** / Every year beginning at age 40 and continuing for as  long as you are in good health. Consult with your caregiver.   Pap test.** / Every 3 years starting at age 30 through age 65 or 70 with a history of 3 consecutive normal Pap tests.   HPV screening.** / Every 3 years from ages 30 through ages 65 to 70 with a history of 3 consecutive normal Pap tests.   Fecal occult blood test (FOBT) of stool. / Every year beginning at age 50 and continuing until age 75. You may not need to do this test if you get a colonoscopy every 10 years.   Flexible sigmoidoscopy or colonoscopy.** / Every 5 years for a flexible sigmoidoscopy or every 10 years for a colonoscopy beginning at age 50 and continuing until age 75.   Hepatitis C blood test.** / For all people born from 1945 through 1965 and any individual with known risks for hepatitis C.   Skin self-exam. / Monthly.   Influenza immunization.** / Every year.   Pneumococcal polysaccharide immunization.** / 1 to 2 doses if you smoke cigarettes or if you have certain chronic medical conditions.   Tetanus, diphtheria, pertussis (Tdap, Td) immunization.** / A one-time dose of Tdap vaccine. After that, you need a Td booster dose every 10 years.   Measles, mumps, rubella (MMR) immunization. / You need at least 1 dose of MMR if you were born in 1957 or later. You may also need a second dose.   Varicella immunization.** / Consult your caregiver.   Meningococcal immunization.** / Consult your caregiver.   Hepatitis A immunization.** / Consult your caregiver. 2 doses, 6 to 18 months apart.   Hepatitis B immunization.** / Consult your caregiver. 3 doses, usually over 6 months.  Ages 65 and over  Blood pressure check.** / Every 1 to 2 years.   Lipid and cholesterol check.** / Every 5 years beginning at age 20.   Clinical breast exam.** / Every year after age 40.   Mammogram.** / Every year beginning at age 40 and continuing for as long as you are in good health. Consult with your caregiver.   Pap test.** /  Every 3 years starting at age 30 through age 65 or 70 with a 3 consecutive normal Pap tests. Testing can be stopped between 65 and 70 with 3 consecutive normal Pap tests and no abnormal Pap or HPV tests in the past 10 years.   HPV screening.** / Every 3 years from ages 30 through ages 65 or 70 with a history of 3 consecutive normal Pap tests. Testing can be stopped between 65 and 70 with 3 consecutive normal Pap tests and no abnormal Pap or HPV tests in the past 10 years.   Fecal occult blood test (FOBT) of stool. / Every year beginning at age 50 and continuing until age 75. You may not need to do this test if you get a colonoscopy every 10 years.   Flexible sigmoidoscopy or colonoscopy.** / Every 5 years for a flexible sigmoidoscopy or every 10 years for a colonoscopy beginning at age 50 and continuing until age 75.   Hepatitis   C blood test.** / For all people born from 1945 through 1965 and any individual with known risks for hepatitis C.   Osteoporosis screening.** / A one-time screening for women ages 65 and over and women at risk for fractures or osteoporosis.   Skin self-exam. / Monthly.   Influenza immunization.** / Every year.   Pneumococcal polysaccharide immunization.** / 1 dose at age 65 (or older) if you have never been vaccinated.   Tetanus, diphtheria, pertussis (Tdap, Td) immunization. / A one-time dose of Tdap vaccine if you are over 65 and have contact with an infant, are a healthcare worker, or simply want to be protected from whooping cough. After that, you need a Td booster dose every 10 years.   Varicella immunization.** / Consult your caregiver.   Meningococcal immunization.** / Consult your caregiver.   Hepatitis A immunization.** / Consult your caregiver. 2 doses, 6 to 18 months apart.   Hepatitis B immunization.** / Check with your caregiver. 3 doses, usually over 6 months.  ** Family history and personal history of risk and conditions may change your caregiver's  recommendations. Document Released: 11/10/2001 Document Revised: 09/03/2011 Document Reviewed: 02/09/2011 [employer] Patient Information 2012 [employer]. 

## 2012-02-12 NOTE — Progress Notes (Signed)
  Subjective:     Janet Knox is a 56 y.o. female and is here for a comprehensive physical exam. The patient reports no problems.  History   Social History  . Marital Status: Married    Spouse Name: N/A    Number of Children: N/A  . Years of Education: N/A   Occupational History  . [employer]    Social History Main Topics  . Smoking status: Never Smoker   . Smokeless tobacco: Never Used  . Alcohol Use: 0.6 oz/week    1 Glasses of wine per week  . Drug Use: No  . Sexually Active: Yes -- Female partner(s)   Other Topics Concern  . Not on file   Social History Narrative   Exercise-- walks 4 miles 4-5 x a week   Health Maintenance  Topic Date Due  . Influenza Vaccine  06/28/2012  . Tetanus/tdap  11/03/2012  . Mammogram  09/28/2013  . Pap Smear  11/25/2014  . Colonoscopy  11/29/2015    The following portions of the patient's history were reviewed and updated as appropriate: allergies, current medications, past family history, past medical history, past social history, past surgical history and problem list.  Review of Systems Review of Systems  Constitutional: Negative for activity change, appetite change and fatigue.  HENT: Negative for hearing loss, congestion, tinnitus and ear discharge.  dentist q5m Eyes: Negative for visual disturbance (see optho q1y -- vision corrected to 20/20 with contacts).  Respiratory: Negative for cough, chest tightness and shortness of breath.   Cardiovascular: Negative for chest pain, palpitations and leg swelling.  Gastrointestinal: Negative for abdominal pain, diarrhea, constipation and abdominal distention.  Genitourinary: Negative for urgency, frequency, decreased urine volume and difficulty urinating.  Musculoskeletal: Negative for back pain, arthralgias and gait problem.  Skin: Negative for color change, pallor and rash. derm-- q1y Neurological: Negative for dizziness, light-headedness, numbness and headaches.  Hematological: Negative  for adenopathy. Does not bruise/bleed easily.  Psychiatric/Behavioral: Negative for suicidal ideas, confusion, sleep disturbance, self-injury, dysphoric mood, decreased concentration and agitation.       Objective:    BP 114/64  Pulse 64  Temp(Src) 98.2 F (36.8 C) (Oral)  Ht 5' 4.5" (1.638 m)  Wt 142 lb 9.6 oz (64.683 kg)  BMI 24.10 kg/m2  SpO2 96% General appearance: alert, cooperative, appears stated age and no distress Head: Normocephalic, without obvious abnormality, atraumatic Eyes: conjunctivae/corneas clear. PERRL, EOM's intact. Fundi benign. Ears: normal TM's and external ear canals both ears Nose: Nares normal. Septum midline. Mucosa normal. No drainage or sinus tenderness. Throat: lips, mucosa, and tongue normal; teeth and gums normal Neck: no adenopathy, no carotid bruit, no JVD, supple, symmetrical, trachea midline and thyroid not enlarged, symmetric, no tenderness/mass/nodules Back: symmetric, no curvature. ROM normal. No CVA tenderness. Lungs: clear to auscultation bilaterally Breasts: gyn Heart: regular rate and rhythm, S1, S2 normal, no murmur, click, rub or gallop Abdomen: soft, non-tender; bowel sounds normal; no masses,  no organomegaly Pelvic: gyn Extremities: extremities normal, atraumatic, no cyanosis or edema Pulses: 2+ and symmetric Skin: Skin color, texture, turgor normal. No rashes or lesions Lymph nodes: Cervical, supraclavicular, and axillary nodes normal. Neurologic: Alert and oriented X 3, normal strength and tone. Normal symmetric reflexes. Normal coordination and gait psych-- no anxiety, depression    Assessment:    Healthy female exam.       Plan:    ghm  utd Check labs See After Visit Summary for Counseling Recommendations

## 2012-02-14 ENCOUNTER — Other Ambulatory Visit: Payer: Self-pay | Admitting: Family Medicine

## 2012-02-14 NOTE — Assessment & Plan Note (Signed)
Check labs 

## 2012-02-14 NOTE — Assessment & Plan Note (Addendum)
con't meds stable 

## 2012-02-16 ENCOUNTER — Encounter: Payer: Self-pay | Admitting: Family Medicine

## 2012-03-08 ENCOUNTER — Other Ambulatory Visit: Payer: Self-pay | Admitting: Family Medicine

## 2012-11-12 ENCOUNTER — Other Ambulatory Visit: Payer: Self-pay

## 2012-11-28 ENCOUNTER — Encounter: Payer: Self-pay | Admitting: Family Medicine

## 2012-11-28 ENCOUNTER — Ambulatory Visit (INDEPENDENT_AMBULATORY_CARE_PROVIDER_SITE_OTHER): Payer: BC Managed Care – PPO | Admitting: Family Medicine

## 2012-11-28 VITALS — BP 136/74 | HR 54 | Temp 98.0°F | Wt 151.8 lb

## 2012-11-28 DIAGNOSIS — J0110 Acute frontal sinusitis, unspecified: Secondary | ICD-10-CM

## 2012-11-28 MED ORDER — CEFUROXIME AXETIL 500 MG PO TABS: 500.0000 mg | ORAL_TABLET | Freq: Two times a day (BID) | ORAL | Status: AC

## 2012-11-28 NOTE — Patient Instructions (Addendum)

## 2012-11-28 NOTE — Progress Notes (Signed)
  Subjective:     Janet Knox is a 57 y.o. female who presents for evaluation of sinus pain. Symptoms include: congestion, facial pain, headaches, nasal congestion and sinus pressure. Onset of symptoms was 3 weeks ago. Symptoms have been gradually worsening since that time. Past history is significant for no history of pneumonia or bronchitis. Patient is a non-smoker.  The following portions of the patient's history were reviewed and updated as appropriate: allergies, current medications, past family history, past medical history, past social history, past surgical history and problem list.  Review of Systems Pertinent items are noted in HPI.   Objective:    BP 136/74  Pulse 54  Temp(Src) 98 F (36.7 C) (Oral)  Wt 151 lb 12.8 oz (68.856 kg)  BMI 25.66 kg/m2  SpO2 98% General appearance: alert, cooperative, appears stated age and no distress Ears: TM-+ fluid Nose: green discharge, moderate congestion, turbinates red, swollen, sinus tenderness left Throat: abnormal findings: mild oropharyngeal erythema Neck: mild anterior cervical adenopathy, supple, symmetrical, trachea midline and thyroid not enlarged, symmetric, no tenderness/mass/nodules Lungs: clear to auscultation bilaterally    Assessment:    Acute bacterial sinusitis.    Plan:    Nasal steroids per medication orders. Antihistamines per medication orders. Ceftin per medication orders.

## 2012-12-27 ENCOUNTER — Telehealth: Payer: Self-pay | Admitting: Family Medicine

## 2012-12-27 DIAGNOSIS — E785 Hyperlipidemia, unspecified: Secondary | ICD-10-CM

## 2012-12-27 DIAGNOSIS — I10 Essential (primary) hypertension: Secondary | ICD-10-CM

## 2012-12-27 NOTE — Telephone Encounter (Signed)
Refill: Metoprolol tab tar 50 mg. Take 1 tablet twice a day. 90 day supply  tricor tab 48 mg. Take 1 tablet daily. 90 day supply

## 2012-12-27 NOTE — Telephone Encounter (Signed)
Refill: Atorvastatin tab 40 mg. Take 1 tablet at bedtime. 90 day supply

## 2012-12-28 MED ORDER — METOPROLOL TARTRATE 50 MG PO TABS: 50.0000 mg | ORAL_TABLET | Freq: Two times a day (BID) | ORAL | Status: DC

## 2012-12-28 MED ORDER — FENOFIBRATE 48 MG PO TABS: 48.0000 mg | ORAL_TABLET | Freq: Every day | ORAL | Status: DC

## 2012-12-28 MED ORDER — ATORVASTATIN CALCIUM 40 MG PO TABS: ORAL_TABLET | ORAL | Status: DC

## 2012-12-28 NOTE — Telephone Encounter (Signed)
2nd request received from [facility] for atorvastatin, tricor, and metoprolol.

## 2013-03-22 ENCOUNTER — Ambulatory Visit (INDEPENDENT_AMBULATORY_CARE_PROVIDER_SITE_OTHER): Payer: BC Managed Care – PPO | Admitting: Family Medicine

## 2013-03-22 ENCOUNTER — Encounter: Payer: Self-pay | Admitting: Family Medicine

## 2013-03-22 VITALS — BP 126/70 | HR 59 | Temp 98.3°F | Ht 64.75 in | Wt 145.4 lb

## 2013-03-22 DIAGNOSIS — Z23 Encounter for immunization: Secondary | ICD-10-CM

## 2013-03-22 DIAGNOSIS — Z0000 Encounter for general adult medical examination without abnormal findings: Secondary | ICD-10-CM

## 2013-03-22 DIAGNOSIS — I10 Essential (primary) hypertension: Secondary | ICD-10-CM

## 2013-03-22 DIAGNOSIS — E785 Hyperlipidemia, unspecified: Secondary | ICD-10-CM

## 2013-03-22 LAB — CBC WITH DIFFERENTIAL/PLATELET
Basophils Relative: 0.5 % (ref 0.0–3.0)
Eosinophils Absolute: 0.1 10*3/uL (ref 0.0–0.7)
HCT: 35.8 % — ABNORMAL LOW (ref 36.0–46.0)
Hemoglobin: 12.1 g/dL (ref 12.0–15.0)
Lymphocytes Relative: 32.6 % (ref 12.0–46.0)
Lymphs Abs: 1.3 10*3/uL (ref 0.7–4.0)
MCHC: 33.9 g/dL (ref 30.0–36.0)
Monocytes Relative: 9 % (ref 3.0–12.0)
Neutro Abs: 2.2 10*3/uL (ref 1.4–7.7)
RBC: 3.78 Mil/uL — ABNORMAL LOW (ref 3.87–5.11)

## 2013-03-22 LAB — POCT URINALYSIS DIPSTICK
Blood, UA: NEGATIVE
Glucose, UA: NEGATIVE
Ketones, UA: NEGATIVE
Spec Grav, UA: >=1.03
Urobilinogen, UA: 0.2

## 2013-03-22 LAB — LIPID PANEL
Cholesterol: 155 mg/dL (ref 0–200)
LDL Cholesterol: 78 mg/dL (ref 0–99)
Triglycerides: 77 mg/dL (ref 0.0–149.0)
VLDL: 15.4 mg/dL (ref 0.0–40.0)

## 2013-03-22 LAB — TSH: TSH: 2.4 u[IU]/mL (ref 0.35–5.50)

## 2013-03-22 LAB — BASIC METABOLIC PANEL
CO2: 24 mEq/L (ref 19–32)
Calcium: 9.1 mg/dL (ref 8.4–10.5)
Potassium: 3.5 mEq/L (ref 3.5–5.1)
Sodium: 134 mEq/L — ABNORMAL LOW (ref 135–145)

## 2013-03-22 LAB — MICROALBUMIN / CREATININE URINE RATIO
Creatinine,U: 190 mg/dL
Microalb, Ur: 0.7 mg/dL (ref 0.0–1.9)

## 2013-03-22 LAB — HEPATIC FUNCTION PANEL
ALT: 19 U/L (ref 0–35)
Albumin: 3.9 g/dL (ref 3.5–5.2)
Total Bilirubin: 0.2 mg/dL — ABNORMAL LOW (ref 0.3–1.2)

## 2013-03-22 MED ORDER — FENOFIBRATE 48 MG PO TABS: 48.0000 mg | ORAL_TABLET | Freq: Every day | ORAL | Status: DC

## 2013-03-22 MED ORDER — ATORVASTATIN CALCIUM 40 MG PO TABS: ORAL_TABLET | ORAL | Status: DC

## 2013-03-22 MED ORDER — METOPROLOL TARTRATE 50 MG PO TABS: 50.0000 mg | ORAL_TABLET | Freq: Two times a day (BID) | ORAL | Status: DC

## 2013-03-22 NOTE — Assessment & Plan Note (Signed)
Stable con't meds 

## 2013-03-22 NOTE — Progress Notes (Signed)
Subjective:     Janet Knox is a 57 y.o. female and is here for a comprehensive physical exam. The patient reports no problems.  History   Social History  . Marital Status: Married    Spouse Name: N/A    Number of Children: N/A  . Years of Education: N/A   Occupational History  . [employer]    Social History Main Topics  . Smoking status: Never Smoker   . Smokeless tobacco: Never Used  . Alcohol Use: 0.6 oz/week    1 Glasses of wine per week  . Drug Use: No  . Sexually Active: Yes -- Female partner(s)   Other Topics Concern  . Not on file   Social History Narrative   Exercise-- walks 4 miles 4-5 x a week---treadmill and stationary bike   Health Maintenance  Topic Date Due  . Influenza Vaccine  05/29/2013  . Mammogram  12/28/2014  . Colonoscopy  11/29/2015  . Pap Smear  12/28/2015  . Tetanus/tdap  03/23/2023    The following portions of the patient's history were reviewed and updated as appropriate:  She  has a past medical history of Anemia; Hyperlipidemia; Hypertension; and Skin cancer. She  does not have any pertinent problems on file. She  has past surgical history that includes Abdominal hysterectomy and Knee surgery. Her family history includes Alzheimer's disease in her mother; Coronary artery disease in her paternal grandfather; Hyperlipidemia in her father and mother; Hypertension in her father and mother; and Stroke in her paternal grandfather. She  reports that she has never smoked. She has never used smokeless tobacco. She reports that she drinks about 0.6 ounces of alcohol per week. She reports that she does not use illicit drugs. She has a current medication list which includes the following prescription(s): aspirin, atorvastatin, cholecalciferol, coq-10, estradiol, fenofibrate, metoprolol, multivitamin, niacin, fish oil, and vitamin e. Current Outpatient Prescriptions on File Prior to Visit  Medication Sig Dispense Refill  . aspirin 81 MG tablet  Take 81 mg by mouth daily.      . cholecalciferol (VITAMIN D) 1000 UNITS tablet Take 2,000 Units by mouth daily.      . Coenzyme Q10 (COQ-10) 200 MG CAPS Take 1 capsule by mouth daily.      Marland Kitchen estradiol (ESTRACE) 2 MG tablet Take 2 mg by mouth daily.      . Multiple Vitamin (MULTIVITAMIN) tablet Take 1 tablet by mouth daily.      . niacin 500 MG tablet Take 1,000 mg by mouth daily with breakfast.      . Omega-3 Fatty Acids (FISH OIL) 1200 MG CAPS Take 2 capsules by mouth daily.      . vitamin E 400 UNIT capsule Take 400 Units by mouth daily.       No current facility-administered medications on file prior to visit.   She has No Known Allergies..  Review of Systems   Objective:    BP 126/70  Pulse 59  Temp(Src) 98.3 F (36.8 C) (Oral)  Ht 5' 4.75" (1.645 m)  Wt 145 lb 6.4 oz (65.953 kg)  BMI 24.37 kg/m2  SpO2 98% General appearance: alert, cooperative, appears stated age and no distress Head: Normocephalic, without obvious abnormality, atraumatic Eyes: conjunctivae/corneas clear. PERRL, EOM's intact. Fundi benign. Ears: normal TM's and external ear canals both ears Nose: Nares normal. Septum midline. Mucosa normal. No drainage or sinus tenderness. Throat: lips, mucosa, and tongue normal; teeth and gums normal Neck: no adenopathy, no carotid bruit, no  JVD, supple, symmetrical, trachea midline and thyroid not enlarged, symmetric, no tenderness/mass/nodules Back: symmetric, no curvature. ROM normal. No CVA tenderness. Lungs: clear to auscultation bilaterally Breasts: normal appearance, no masses or tenderness Heart: regular rate and rhythm, S1, S2 normal, no murmur, click, rub or gallop Abdomen: soft, non-tender; bowel sounds normal; no masses,  no organomegaly Pelvic: cervix normal in appearance, external genitalia normal, no adnexal masses or tenderness, no cervical motion tenderness, rectovaginal septum normal, uterus normal size, shape, and consistency and vagina normal without  discharge -- pap done Extremities: extremities normal, atraumatic, no cyanosis or edema Pulses: 2+ and symmetric Skin: Skin color, texture, turgor normal. No rashes or lesions Lymph nodes: Cervical, supraclavicular, and axillary nodes normal. Neurologic: Alert and oriented X 3, normal strength and tone. Normal symmetric reflexes. Normal coordination and gait Psych-- no depression, no anxiety      Assessment:    Healthy female exam.      Plan:    ghm utd Check labs See avs See After Visit Summary for Counseling Recommendations

## 2013-03-22 NOTE — Assessment & Plan Note (Signed)
Check labs 

## 2013-03-22 NOTE — Patient Instructions (Addendum)
Preventive Care for Adults, Female A healthy lifestyle and preventive care can promote health and wellness. Preventive health guidelines for women include the following key practices.  A routine yearly physical is a good way to check with your caregiver about your health and preventive screening. It is a chance to share any concerns and updates on your health, and to receive a thorough exam.  Visit your dentist for a routine exam and preventive care every 6 months. Brush your teeth twice a day and floss once a day. Good oral hygiene prevents tooth decay and gum disease.  The frequency of eye exams is based on your age, health, family medical history, use of contact lenses, and other factors. Follow your caregiver's recommendations for frequency of eye exams.  Eat a healthy diet. Foods like vegetables, fruits, whole grains, low-fat dairy products, and lean protein foods contain the nutrients you need without too many calories. Decrease your intake of foods high in solid fats, added sugars, and salt. Eat the right amount of calories for you.Get information about a proper diet from your caregiver, if necessary.  Regular physical exercise is one of the most important things you can do for your health. Most adults should get at least 150 minutes of moderate-intensity exercise (any activity that increases your heart rate and causes you to sweat) each week. In addition, most adults need muscle-strengthening exercises on 2 or more days a week.  Maintain a healthy weight. The body mass index (BMI) is a screening tool to identify possible weight problems. It provides an estimate of body fat based on height and weight. Your caregiver can help determine your BMI, and can help you achieve or maintain a healthy weight.For adults 20 years and older:  A BMI below 18.5 is considered underweight.  A BMI of 18.5 to 24.9 is normal.  A BMI of 25 to 29.9 is considered overweight.  A BMI of 30 and above is  considered obese.  Maintain normal blood lipids and cholesterol levels by exercising and minimizing your intake of saturated fat. Eat a balanced diet with plenty of fruit and vegetables. Blood tests for lipids and cholesterol should begin at age 20 and be repeated every 5 years. If your lipid or cholesterol levels are high, you are over 50, or you are at high risk for heart disease, you may need your cholesterol levels checked more frequently.Ongoing high lipid and cholesterol levels should be treated with medicines if diet and exercise are not effective.  If you smoke, find out from your caregiver how to quit. If you do not use tobacco, do not start.  If you are pregnant, do not drink alcohol. If you are breastfeeding, be very cautious about drinking alcohol. If you are not pregnant and choose to drink alcohol, do not exceed 1 drink per day. One drink is considered to be 12 ounces (355 mL) of beer, 5 ounces (148 mL) of wine, or 1.5 ounces (44 mL) of liquor.  Avoid use of street drugs. Do not share needles with anyone. Ask for help if you need support or instructions about stopping the use of drugs.  High blood pressure causes heart disease and increases the risk of stroke. Your blood pressure should be checked at least every 1 to 2 years. Ongoing high blood pressure should be treated with medicines if weight loss and exercise are not effective.  If you are 55 to 58 years old, ask your caregiver if you should take aspirin to prevent strokes.  Diabetes   screening involves taking a blood sample to check your fasting blood sugar level. This should be done once every 3 years, after age 45, if you are within normal weight and without risk factors for diabetes. Testing should be considered at a younger age or be carried out more frequently if you are overweight and have at least 1 risk factor for diabetes.  Breast cancer screening is essential preventive care for women. You should practice "breast  self-awareness." This means understanding the normal appearance and feel of your breasts and may include breast self-examination. Any changes detected, no matter how small, should be reported to a caregiver. Women in their 20s and 30s should have a clinical breast exam (CBE) by a caregiver as part of a regular health exam every 1 to 3 years. After age 40, women should have a CBE every year. Starting at age 40, women should consider having a mammography (breast X-ray test) every year. Women who have a family history of breast cancer should talk to their caregiver about genetic screening. Women at a high risk of breast cancer should talk to their caregivers about having magnetic resonance imaging (MRI) and a mammography every year.  The Pap test is a screening test for cervical cancer. A Pap test can show cell changes on the cervix that might become cervical cancer if left untreated. A Pap test is a procedure in which cells are obtained and examined from the lower end of the uterus (cervix).  Women should have a Pap test starting at age 21.  Between ages 21 and 29, Pap tests should be repeated every 2 years.  Beginning at age 30, you should have a Pap test every 3 years as long as the past 3 Pap tests have been normal.  Some women have medical problems that increase the chance of getting cervical cancer. Talk to your caregiver about these problems. It is especially important to talk to your caregiver if a new problem develops soon after your last Pap test. In these cases, your caregiver may recommend more frequent screening and Pap tests.  The above recommendations are the same for women who have or have not gotten the vaccine for human papillomavirus (HPV).  If you had a hysterectomy for a problem that was not cancer or a condition that could lead to cancer, then you no longer need Pap tests. Even if you no longer need a Pap test, a regular exam is a good idea to make sure no other problems are  starting.  If you are between ages 65 and 70, and you have had normal Pap tests going back 10 years, you no longer need Pap tests. Even if you no longer need a Pap test, a regular exam is a good idea to make sure no other problems are starting.  If you have had past treatment for cervical cancer or a condition that could lead to cancer, you need Pap tests and screening for cancer for at least 20 years after your treatment.  If Pap tests have been discontinued, risk factors (such as a new sexual partner) need to be reassessed to determine if screening should be resumed.  The HPV test is an additional test that may be used for cervical cancer screening. The HPV test looks for the virus that can cause the cell changes on the cervix. The cells collected during the Pap test can be tested for HPV. The HPV test could be used to screen women aged 30 years and older, and should   be used in women of any age who have unclear Pap test results. After the age of 30, women should have HPV testing at the same frequency as a Pap test.  Colorectal cancer can be detected and often prevented. Most routine colorectal cancer screening begins at the age of 50 and continues through age 75. However, your caregiver may recommend screening at an earlier age if you have risk factors for colon cancer. On a yearly basis, your caregiver may provide home test kits to check for hidden blood in the stool. Use of a small camera at the end of a tube, to directly examine the colon (sigmoidoscopy or colonoscopy), can detect the earliest forms of colorectal cancer. Talk to your caregiver about this at age 50, when routine screening begins. Direct examination of the colon should be repeated every 5 to 10 years through age 75, unless early forms of pre-cancerous polyps or small growths are found.  Hepatitis C blood testing is recommended for all people born from 1945 through 1965 and any individual with known risks for hepatitis C.  Practice  safe sex. Use condoms and avoid high-risk sexual practices to reduce the spread of sexually transmitted infections (STIs). STIs include gonorrhea, chlamydia, syphilis, trichomonas, herpes, HPV, and human immunodeficiency virus (HIV). Herpes, HIV, and HPV are viral illnesses that have no cure. They can result in disability, cancer, and death. Sexually active women aged 25 and younger should be checked for chlamydia. Older women with new or multiple partners should also be tested for chlamydia. Testing for other STIs is recommended if you are sexually active and at increased risk.  Osteoporosis is a disease in which the bones lose minerals and strength with aging. This can result in serious bone fractures. The risk of osteoporosis can be identified using a bone density scan. Women ages 65 and over and women at risk for fractures or osteoporosis should discuss screening with their caregivers. Ask your caregiver whether you should take a calcium supplement or vitamin D to reduce the rate of osteoporosis.  Menopause can be associated with physical symptoms and risks. Hormone replacement therapy is available to decrease symptoms and risks. You should talk to your caregiver about whether hormone replacement therapy is right for you.  Use sunscreen with sun protection factor (SPF) of 30 or more. Apply sunscreen liberally and repeatedly throughout the day. You should seek shade when your shadow is shorter than you. Protect yourself by wearing long sleeves, pants, a wide-brimmed hat, and sunglasses year round, whenever you are outdoors.  Once a month, do a whole body skin exam, using a mirror to look at the skin on your back. Notify your caregiver of new moles, moles that have irregular borders, moles that are larger than a pencil eraser, or moles that have changed in shape or color.  Stay current with required immunizations.  Influenza. You need a dose every fall (or winter). The composition of the flu vaccine  changes each year, so being vaccinated once is not enough.  Pneumococcal polysaccharide. You need 1 to 2 doses if you smoke cigarettes or if you have certain chronic medical conditions. You need 1 dose at age 65 (or older) if you have never been vaccinated.  Tetanus, diphtheria, pertussis (Tdap, Td). Get 1 dose of Tdap vaccine if you are younger than age 65, are over 65 and have contact with an infant, are a healthcare worker, are pregnant, or simply want to be protected from whooping cough. After that, you need a Td   booster dose every 10 years. Consult your caregiver if you have not had at least 3 tetanus and diphtheria-containing shots sometime in your life or have a deep or dirty wound.  HPV. You need this vaccine if you are a woman age 26 or younger. The vaccine is given in 3 doses over 6 months.  Measles, mumps, rubella (MMR). You need at least 1 dose of MMR if you were born in 1957 or later. You may also need a second dose.  Meningococcal. If you are age 19 to 21 and a first-year college student living in a residence hall, or have one of several medical conditions, you need to get vaccinated against meningococcal disease. You may also need additional booster doses.  Zoster (shingles). If you are age 60 or older, you should get this vaccine.  Varicella (chickenpox). If you have never had chickenpox or you were vaccinated but received only 1 dose, talk to your caregiver to find out if you need this vaccine.  Hepatitis A. You need this vaccine if you have a specific risk factor for hepatitis A virus infection or you simply wish to be protected from this disease. The vaccine is usually given as 2 doses, 6 to 18 months apart.  Hepatitis B. You need this vaccine if you have a specific risk factor for hepatitis B virus infection or you simply wish to be protected from this disease. The vaccine is given in 3 doses, usually over 6 months. Preventive Services / Frequency Ages 19 to 39  Blood  pressure check.** / Every 1 to 2 years.  Lipid and cholesterol check.** / Every 5 years beginning at age 20.  Clinical breast exam.** / Every 3 years for women in their 20s and 30s.  Pap test.** / Every 2 years from ages 21 through 29. Every 3 years starting at age 30 through age 65 or 70 with a history of 3 consecutive normal Pap tests.  HPV screening.** / Every 3 years from ages 30 through ages 65 to 70 with a history of 3 consecutive normal Pap tests.  Hepatitis C blood test.** / For any individual with known risks for hepatitis C.  Skin self-exam. / Monthly.  Influenza immunization.** / Every year.  Pneumococcal polysaccharide immunization.** / 1 to 2 doses if you smoke cigarettes or if you have certain chronic medical conditions.  Tetanus, diphtheria, pertussis (Tdap, Td) immunization. / A one-time dose of Tdap vaccine. After that, you need a Td booster dose every 10 years.  HPV immunization. / 3 doses over 6 months, if you are 26 and younger.  Measles, mumps, rubella (MMR) immunization. / You need at least 1 dose of MMR if you were born in 1957 or later. You may also need a second dose.  Meningococcal immunization. / 1 dose if you are age 19 to 21 and a first-year college student living in a residence hall, or have one of several medical conditions, you need to get vaccinated against meningococcal disease. You may also need additional booster doses.  Varicella immunization.** / Consult your caregiver.  Hepatitis A immunization.** / Consult your caregiver. 2 doses, 6 to 18 months apart.  Hepatitis B immunization.** / Consult your caregiver. 3 doses usually over 6 months. Ages 40 to 64  Blood pressure check.** / Every 1 to 2 years.  Lipid and cholesterol check.** / Every 5 years beginning at age 20.  Clinical breast exam.** / Every year after age 40.  Mammogram.** / Every year beginning at age 40   and continuing for as long as you are in good health. Consult with your  caregiver.  Pap test.** / Every 3 years starting at age 30 through age 65 or 70 with a history of 3 consecutive normal Pap tests.  HPV screening.** / Every 3 years from ages 30 through ages 65 to 70 with a history of 3 consecutive normal Pap tests.  Fecal occult blood test (FOBT) of stool. / Every year beginning at age 50 and continuing until age 75. You may not need to do this test if you get a colonoscopy every 10 years.  Flexible sigmoidoscopy or colonoscopy.** / Every 5 years for a flexible sigmoidoscopy or every 10 years for a colonoscopy beginning at age 50 and continuing until age 75.  Hepatitis C blood test.** / For all people born from 1945 through 1965 and any individual with known risks for hepatitis C.  Skin self-exam. / Monthly.  Influenza immunization.** / Every year.  Pneumococcal polysaccharide immunization.** / 1 to 2 doses if you smoke cigarettes or if you have certain chronic medical conditions.  Tetanus, diphtheria, pertussis (Tdap, Td) immunization.** / A one-time dose of Tdap vaccine. After that, you need a Td booster dose every 10 years.  Measles, mumps, rubella (MMR) immunization. / You need at least 1 dose of MMR if you were born in 1957 or later. You may also need a second dose.  Varicella immunization.** / Consult your caregiver.  Meningococcal immunization.** / Consult your caregiver.  Hepatitis A immunization.** / Consult your caregiver. 2 doses, 6 to 18 months apart.  Hepatitis B immunization.** / Consult your caregiver. 3 doses, usually over 6 months. Ages 65 and over  Blood pressure check.** / Every 1 to 2 years.  Lipid and cholesterol check.** / Every 5 years beginning at age 20.  Clinical breast exam.** / Every year after age 40.  Mammogram.** / Every year beginning at age 40 and continuing for as long as you are in good health. Consult with your caregiver.  Pap test.** / Every 3 years starting at age 30 through age 65 or 70 with a 3  consecutive normal Pap tests. Testing can be stopped between 65 and 70 with 3 consecutive normal Pap tests and no abnormal Pap or HPV tests in the past 10 years.  HPV screening.** / Every 3 years from ages 30 through ages 65 or 70 with a history of 3 consecutive normal Pap tests. Testing can be stopped between 65 and 70 with 3 consecutive normal Pap tests and no abnormal Pap or HPV tests in the past 10 years.  Fecal occult blood test (FOBT) of stool. / Every year beginning at age 50 and continuing until age 75. You may not need to do this test if you get a colonoscopy every 10 years.  Flexible sigmoidoscopy or colonoscopy.** / Every 5 years for a flexible sigmoidoscopy or every 10 years for a colonoscopy beginning at age 50 and continuing until age 75.  Hepatitis C blood test.** / For all people born from 1945 through 1965 and any individual with known risks for hepatitis C.  Osteoporosis screening.** / A one-time screening for women ages 65 and over and women at risk for fractures or osteoporosis.  Skin self-exam. / Monthly.  Influenza immunization.** / Every year.  Pneumococcal polysaccharide immunization.** / 1 dose at age 65 (or older) if you have never been vaccinated.  Tetanus, diphtheria, pertussis (Tdap, Td) immunization. / A one-time dose of Tdap vaccine if you are over   65 and have contact with an infant, are a healthcare worker, or simply want to be protected from whooping cough. After that, you need a Td booster dose every 10 years.  Varicella immunization.** / Consult your caregiver.  Meningococcal immunization.** / Consult your caregiver.  Hepatitis A immunization.** / Consult your caregiver. 2 doses, 6 to 18 months apart.  Hepatitis B immunization.** / Check with your caregiver. 3 doses, usually over 6 months. ** Family history and personal history of risk and conditions may change your caregiver's recommendations. Document Released: 11/10/2001 Document Revised: 12/07/2011  Document Reviewed: 02/09/2011 [employer] Patient Information 2014 [employer].  

## 2013-08-03 ENCOUNTER — Other Ambulatory Visit: Payer: Self-pay

## 2013-10-16 ENCOUNTER — Other Ambulatory Visit: Payer: Self-pay | Admitting: Family Medicine

## 2013-10-26 ENCOUNTER — Other Ambulatory Visit: Payer: Self-pay | Admitting: Family Medicine

## 2013-10-26 MED ORDER — ATORVASTATIN CALCIUM 40 MG PO TABS: ORAL_TABLET | ORAL | Status: DC

## 2013-10-26 MED ORDER — METOPROLOL TARTRATE 50 MG PO TABS: ORAL_TABLET | ORAL | Status: DC

## 2013-10-26 MED ORDER — FENOFIBRATE 48 MG PO TABS: ORAL_TABLET | ORAL | Status: DC

## 2013-11-01 ENCOUNTER — Ambulatory Visit (INDEPENDENT_AMBULATORY_CARE_PROVIDER_SITE_OTHER): Payer: BC Managed Care – PPO | Admitting: Family Medicine

## 2013-11-01 ENCOUNTER — Encounter: Payer: Self-pay | Admitting: Family Medicine

## 2013-11-01 VITALS — BP 116/74 | HR 73 | Temp 98.2°F | Wt 146.0 lb

## 2013-11-01 DIAGNOSIS — E785 Hyperlipidemia, unspecified: Secondary | ICD-10-CM

## 2013-11-01 DIAGNOSIS — I10 Essential (primary) hypertension: Secondary | ICD-10-CM

## 2013-11-01 LAB — BASIC METABOLIC PANEL WITH GFR
BUN: 25 mg/dL — ABNORMAL HIGH (ref 6–23)
CO2: 24 meq/L (ref 19–32)
Calcium: 9 mg/dL (ref 8.4–10.5)
Chloride: 105 meq/L (ref 96–112)
Creatinine, Ser: 0.7 mg/dL (ref 0.4–1.2)
GFR: 97.67 mL/min (ref >60.00)
Glucose, Bld: 83 mg/dL (ref 70–99)
Potassium: 3.9 meq/L (ref 3.5–5.1)
Sodium: 138 meq/L (ref 135–145)

## 2013-11-01 LAB — HEPATIC FUNCTION PANEL
ALBUMIN: 3.9 g/dL (ref 3.5–5.2)
ALT: 19 U/L (ref 0–35)
AST: 24 U/L (ref 0–37)
Alkaline Phosphatase: 45 U/L (ref 39–117)
Bilirubin, Direct: 0 mg/dL (ref 0.0–0.3)
Total Bilirubin: 0.6 mg/dL (ref 0.3–1.2)
Total Protein: 7.2 g/dL (ref 6.0–8.3)

## 2013-11-01 LAB — LIPID PANEL
Cholesterol: 179 mg/dL (ref 0–200)
HDL: 65.8 mg/dL (ref >39.00)
LDL Cholesterol: 92 mg/dL (ref 0–99)
TRIGLYCERIDES: 105 mg/dL (ref 0.0–149.0)
Total CHOL/HDL Ratio: 3
VLDL: 21 mg/dL (ref 0.0–40.0)

## 2013-11-01 MED ORDER — FENOFIBRATE 48 MG PO TABS: ORAL_TABLET | ORAL | Status: DC

## 2013-11-01 MED ORDER — ATORVASTATIN CALCIUM 40 MG PO TABS: ORAL_TABLET | ORAL | Status: DC

## 2013-11-01 MED ORDER — METOPROLOL TARTRATE 50 MG PO TABS: ORAL_TABLET | ORAL | Status: DC

## 2013-11-01 NOTE — Progress Notes (Signed)
Pre visit review using our clinic review tool, if applicable. No additional management support is needed unless otherwise documented below in the visit note. 

## 2013-11-01 NOTE — Progress Notes (Signed)
  Subjective:    Janet Knox is a 58 y.o. female here for follow up of dyslipidemia. The patient does not use medications that may worsen dyslipidemias (corticosteroids, progestins, anabolic steroids, diuretics, beta-blockers, amiodarone, cyclosporine, olanzapine). The patient exercises frequently. The patient is not known to have coexisting coronary artery disease.   Cardiac Risk Factors Age > 45-female, > 55-female:  YES  +1  Smoking:   NO  Sig. family hx of CHD*:  NO  Hypertension:   YES  +1  Diabetes:   NO  HDL < 35:   NO  HDL > 63:   YES  -1  Total: 1   *- Sig. family h/o CHD per NCEP = MI or sudden death at <55yo in  father or other 68st-degree female relative, or <65yo in mother or  other 1st-degree female relative  The following portions of the patient's history were reviewed and updated as appropriate: allergies, current medications, past family history, past medical history, past social history, past surgical history and problem list.  Review of Systems Pertinent items are noted in HPI.    Objective:    BP 116/74  Pulse 73  Temp(Src) 98.2 F (36.8 C) (Oral)  Wt 146 lb (66.225 kg)  SpO2 98% General appearance: alert, cooperative, appears stated age and no distress Neck: no adenopathy, no carotid bruit, no JVD, supple, symmetrical, trachea midline and thyroid not enlarged, symmetric, no tenderness/mass/nodules Lungs: clear to auscultation bilaterally Heart: regular rate and rhythm, S1, S2 normal, no murmur, click, rub or gallop Extremities: extremities normal, atraumatic, no cyanosis or edema  Lab Review Lab Results  Component Value Date   CHOL 155 03/22/2013   CHOL 151 02/12/2012   CHOL 172 09/01/2011   HDL 62.10 03/22/2013   HDL 59.80 02/12/2012   HDL 62.20 09/01/2011   LDLDIRECT 117.5 01/27/2010   LDLDIRECT 104.1 03/16/2007   LDLDIRECT 163.8 11/08/2006      Assessment:    Dyslipidemia as detailed above with 1 CHD risk factors using NCEP scheme  above.   Explained to the patient the respective contributions of genetics, diet, and exercise to lipid levels and the use of medication in severe cases which do not respond to lifestyle alteration. The patient's interest and motivation in making lifestyle changes seems excellent.    Plan:    The following changes are planned for the next 6 months, at which time the patient will return for repeat fasting lipids:  1. Dietary changes: Reduce saturated fat, "trans" monounsaturated fatty acids, and cholesterol 2. Exercise changes:  inc exercise 3. Other treatment: None 4. Lipid-lowering medications: see meds and orders  (Recommended by NCEP after 3-6 mos of dietary therapy & lifestyle modification,  except if CHD is present or LDL well above 190.) 5. Hormone replacement therapy (patient is a postmenopausal  woman): no 6. Screening for secondary causes of dyslipidemias: None indicated 7. Lipid screening for relatives: na 8. Follow up: 6 months.  Note: The majority of the visit was spent in counseling on the pathophysiology and treatment of dyslipidemias. The total face-to-face time was in excess of 25 minutes.

## 2013-11-01 NOTE — Patient Instructions (Signed)

## 2013-11-01 NOTE — Assessment & Plan Note (Signed)
con't meds stable 

## 2013-11-03 ENCOUNTER — Telehealth: Payer: Self-pay | Admitting: Family Medicine

## 2013-11-03 NOTE — Telephone Encounter (Signed)
Relevant patient education assigned to patient using Emmi. ° °

## 2014-10-26 ENCOUNTER — Other Ambulatory Visit: Payer: Self-pay

## 2014-10-26 DIAGNOSIS — E785 Hyperlipidemia, unspecified: Secondary | ICD-10-CM

## 2014-10-26 MED ORDER — FENOFIBRATE 48 MG PO TABS: ORAL_TABLET | ORAL | Status: DC

## 2014-10-31 ENCOUNTER — Other Ambulatory Visit: Payer: Self-pay

## 2014-10-31 DIAGNOSIS — I10 Essential (primary) hypertension: Secondary | ICD-10-CM

## 2014-10-31 DIAGNOSIS — E785 Hyperlipidemia, unspecified: Secondary | ICD-10-CM

## 2014-10-31 MED ORDER — FENOFIBRATE 48 MG PO TABS: ORAL_TABLET | ORAL | Status: DC

## 2014-10-31 MED ORDER — ATORVASTATIN CALCIUM 40 MG PO TABS: ORAL_TABLET | ORAL | Status: DC

## 2014-10-31 MED ORDER — METOPROLOL TARTRATE 50 MG PO TABS: ORAL_TABLET | ORAL | Status: DC

## 2014-10-31 NOTE — Telephone Encounter (Signed)
Rx refill request received from [facility]. I called the patient and advised OV due with Fasting labs for more refills. She stated she did not know the 6 mo policy. I made her aware she is due for a CPE at this time, she verbalized understanding and has agreed to come in. Apt scheduled for March with Dr.Lowne for CPE. She asked for a 30 day supply of her med's be sent to [facility] for the time being. Rx faxed #[phone] only.    KP

## 2014-12-01 ENCOUNTER — Other Ambulatory Visit: Payer: Self-pay | Admitting: Family Medicine

## 2014-12-19 ENCOUNTER — Telehealth: Payer: Self-pay | Admitting: *Deleted

## 2014-12-19 ENCOUNTER — Encounter: Payer: Self-pay | Admitting: *Deleted

## 2014-12-19 NOTE — Telephone Encounter (Signed)
Pre-Visit Call completed with patient and chart updated.   Pre-Visit Info documented in Specialty Comments under [employer].    

## 2014-12-20 ENCOUNTER — Encounter: Payer: Self-pay | Admitting: Family Medicine

## 2014-12-20 ENCOUNTER — Ambulatory Visit (INDEPENDENT_AMBULATORY_CARE_PROVIDER_SITE_OTHER): Payer: BLUE CROSS/BLUE SHIELD | Admitting: Family Medicine

## 2014-12-20 VITALS — BP 116/72 | HR 61 | Temp 98.2°F | Ht 64.75 in | Wt 141.0 lb

## 2014-12-20 DIAGNOSIS — I10 Essential (primary) hypertension: Secondary | ICD-10-CM | POA: Diagnosis not present

## 2014-12-20 DIAGNOSIS — Z0000 Encounter for general adult medical examination without abnormal findings: Secondary | ICD-10-CM

## 2014-12-20 DIAGNOSIS — E785 Hyperlipidemia, unspecified: Secondary | ICD-10-CM | POA: Diagnosis not present

## 2014-12-20 LAB — CBC WITH DIFFERENTIAL/PLATELET
Basophils Absolute: 0 10*3/uL (ref 0.0–0.1)
Basophils Relative: 0.7 % (ref 0.0–3.0)
EOS PCT: 2 % (ref 0.0–5.0)
Eosinophils Absolute: 0.1 10*3/uL (ref 0.0–0.7)
HEMATOCRIT: 34.3 % — AB (ref 36.0–46.0)
HEMOGLOBIN: 11.5 g/dL — AB (ref 12.0–15.0)
LYMPHS PCT: 32.3 % (ref 12.0–46.0)
Lymphs Abs: 1.3 10*3/uL (ref 0.7–4.0)
MCHC: 33.5 g/dL (ref 30.0–36.0)
MCV: 93 fl (ref 78.0–100.0)
MONOS PCT: 8.8 % (ref 3.0–12.0)
Monocytes Absolute: 0.4 10*3/uL (ref 0.1–1.0)
NEUTROS ABS: 2.3 10*3/uL (ref 1.4–7.7)
Neutrophils Relative %: 56.2 % (ref 43.0–77.0)
Platelets: 267 10*3/uL (ref 150.0–400.0)
RBC: 3.69 Mil/uL — ABNORMAL LOW (ref 3.87–5.11)
RDW: 13.7 % (ref 11.5–15.5)
WBC: 4.1 10*3/uL (ref 4.0–10.5)

## 2014-12-20 LAB — HEPATIC FUNCTION PANEL
ALK PHOS: 47 U/L (ref 39–117)
ALT: 13 U/L (ref 0–35)
AST: 21 U/L (ref 0–37)
Albumin: 4.1 g/dL (ref 3.5–5.2)
BILIRUBIN DIRECT: 0.1 mg/dL (ref 0.0–0.3)
Total Bilirubin: 0.5 mg/dL (ref 0.2–1.2)
Total Protein: 7.2 g/dL (ref 6.0–8.3)

## 2014-12-20 LAB — LIPID PANEL
CHOL/HDL RATIO: 3
CHOLESTEROL: 168 mg/dL (ref 0–200)
HDL: 59.7 mg/dL (ref >39.00)
LDL CALC: 89 mg/dL (ref 0–99)
NonHDL: 108.3
TRIGLYCERIDES: 97 mg/dL (ref 0.0–149.0)
VLDL: 19.4 mg/dL (ref 0.0–40.0)

## 2014-12-20 LAB — BASIC METABOLIC PANEL
BUN: 26 mg/dL — ABNORMAL HIGH (ref 6–23)
CALCIUM: 9.1 mg/dL (ref 8.4–10.5)
CO2: 27 meq/L (ref 19–32)
Chloride: 105 mEq/L (ref 96–112)
Creatinine, Ser: 0.69 mg/dL (ref 0.40–1.20)
GFR: 92.42 mL/min (ref >60.00)
GLUCOSE: 86 mg/dL (ref 70–99)
POTASSIUM: 3.4 meq/L — AB (ref 3.5–5.1)
SODIUM: 138 meq/L (ref 135–145)

## 2014-12-20 LAB — TSH: TSH: 2.77 u[IU]/mL (ref 0.35–4.50)

## 2014-12-20 MED ORDER — FENOFIBRATE 48 MG PO TABS: ORAL_TABLET | ORAL | Status: DC

## 2014-12-20 MED ORDER — METOPROLOL TARTRATE 50 MG PO TABS: 50.0000 mg | ORAL_TABLET | Freq: Two times a day (BID) | ORAL | Status: DC

## 2014-12-20 MED ORDER — ATORVASTATIN CALCIUM 40 MG PO TABS: 40.0000 mg | ORAL_TABLET | Freq: Every day | ORAL | Status: DC

## 2014-12-20 NOTE — Progress Notes (Signed)
Pre visit review using our clinic review tool, if applicable. No additional management support is needed unless otherwise documented below in the visit note. 

## 2014-12-20 NOTE — Progress Notes (Signed)
Subjective:     Janet Knox is a 59 y.o. female and is here for a comprehensive physical exam. The patient reports no problems.  History   Social History  . Marital Status: Married    Spouse Name: N/A  . Number of Children: N/A  . Years of Education: N/A   Occupational History  . [employer]    Social History Main Topics  . Smoking status: Never Smoker   . Smokeless tobacco: Never Used  . Alcohol Use: 0.6 oz/week    1 Glasses of wine per week  . Drug Use: No  . Sexual Activity:    Partners: Male   Other Topics Concern  . Not on file   Social History Narrative   Exercise-- walks 4 miles 4-5 x a week---treadmill and stationary bike   Health Maintenance  Topic Date Due  . INFLUENZA VACCINE  04/29/2015 (Originally 04/28/2014)  . HIV Screening  12/20/2015 (Originally 07/19/1971)  . MAMMOGRAM  12/28/2014  . COLONOSCOPY  11/29/2015  . PAP SMEAR  12/28/2015  . TETANUS/TDAP  03/23/2023    The following portions of the patient's history were reviewed and updated as appropriate:  She  has a past medical history of Anemia; Hyperlipidemia; Hypertension; and Skin cancer. She  does not have any pertinent problems on file. She  has past surgical history that includes Abdominal hysterectomy and Knee surgery. Her family history includes Alzheimer's disease in her mother; Coronary artery disease in her paternal grandfather; Hyperlipidemia in her father and mother; Hypertension in her father and mother; Stroke in her paternal grandfather. She  reports that she has never smoked. She has never used smokeless tobacco. She reports that she drinks about 0.6 oz of alcohol per week. She reports that she does not use illicit drugs. She has a current medication list which includes the following prescription(s): aspirin, atorvastatin, vitamin d3, estradiol, fenofibrate, metoprolol, multivitamin, niacin, fish oil, vitamin e, and coq-10. Current Outpatient Prescriptions on File Prior to Visit   Medication Sig Dispense Refill  . aspirin 81 MG tablet Take 81 mg by mouth daily.    Marland Kitchen atorvastatin (LIPITOR) 40 MG tablet Take 1 tablet (40 mg total) by mouth at bedtime. Office visit due now 30 tablet 0  . estradiol (ESTRACE) 2 MG tablet Take 2 mg by mouth daily.    . fenofibrate (TRICOR) 48 MG tablet TAKE 1 TABLET DAILY. Repeat labs are due now 30 tablet 0  . metoprolol (LOPRESSOR) 50 MG tablet Take 1 tablet (50 mg total) by mouth 2 (two) times daily. Office visit due now 60 tablet 0  . Multiple Vitamin (MULTIVITAMIN) tablet Take 1 tablet by mouth daily.    . niacin 500 MG tablet Take 1,000 mg by mouth daily with breakfast.    . Omega-3 Fatty Acids (FISH OIL) 1200 MG CAPS Take 1 capsule by mouth daily.     . vitamin E 400 UNIT capsule Take 400 Units by mouth daily.    . Coenzyme Q10 (COQ-10) 200 MG CAPS Take 1 capsule by mouth daily.     No current facility-administered medications on file prior to visit.   She has No Known Allergies..  Review of Systems Review of Systems  Constitutional: Negative for activity change, appetite change and fatigue.  HENT: Negative for hearing loss, congestion, tinnitus and ear discharge.  dentist q31m Eyes: Negative for visual disturbance (see optho q1y -- vision corrected to 20/20 with glasses).  Respiratory: Negative for cough, chest tightness and shortness of breath.  Cardiovascular: Negative for chest pain, palpitations and leg swelling.  Gastrointestinal: Negative for abdominal pain, diarrhea, constipation and abdominal distention.  Genitourinary: Negative for urgency, frequency, decreased urine volume and difficulty urinating.  Musculoskeletal: Negative for back pain, arthralgias and gait problem.  Skin: Negative for color change, pallor and rash.  Neurological: Negative for dizziness, light-headedness, numbness and headaches.  Hematological: Negative for adenopathy. Does not bruise/bleed easily.  Psychiatric/Behavioral: Negative for suicidal  ideas, confusion, sleep disturbance, self-injury, dysphoric mood, decreased concentration and agitation.       Objective:    BP 116/72 mmHg  Pulse 61  Temp(Src) 98.2 F (36.8 C) (Oral)  Ht 5' 4.75" (1.645 m)  Wt 141 lb (63.957 kg)  BMI 23.64 kg/m2  SpO2 94% General appearance: alert, cooperative, appears stated age and no distress Head: Normocephalic, without obvious abnormality, atraumatic Eyes: conjunctivae/corneas clear. PERRL, EOM's intact. Fundi benign. Ears: normal TM's and external ear canals both ears Nose: Nares normal. Septum midline. Mucosa normal. No drainage or sinus tenderness. Throat: lips, mucosa, and tongue normal; teeth and gums normal Neck: no adenopathy, no carotid bruit, no JVD, supple, symmetrical, trachea midline and thyroid not enlarged, symmetric, no tenderness/mass/nodules Back: symmetric, no curvature. ROM normal. No CVA tenderness. Lungs: clear to auscultation bilaterally Breasts: gyn Heart: regular rate and rhythm, S1, S2 normal, no murmur, click, rub or gallop Abdomen: soft, non-tender; bowel sounds normal; no masses,  no organomegaly Pelvic: deferred Extremities: extremities normal, atraumatic, no cyanosis or edema Pulses: 2+ and symmetric Skin: Skin color, texture, turgor normal. No rashes or lesions Lymph nodes: Cervical, supraclavicular, and axillary nodes normal. Neurologic: Alert and oriented X 3, normal strength and tone. Normal symmetric reflexes. Normal coordination and gait Psych-- no depression, no anxiety      Assessment:    Healthy female exam.       Plan:    ghm utd checkl labs See After Visit Summary for Counseling Recommendations   1. Preventative health care  - Basic metabolic panel - CBC with Differential/Platelet - Hepatic function panel - Lipid panel - POCT urinalysis dipstick - TSH - HIV antibody  2. Hyperlipemia Check labs con't fenofibrate con't lipitor - atorvastatin (LIPITOR) 40 MG tablet; Take 1 tablet  (40 mg total) by mouth at bedtime.  Dispense: 90 tablet; Refill: 3 - fenofibrate (TRICOR) 48 MG tablet; TAKE 1 TABLET DAILY.  Dispense: 90 tablet; Refill: 3  3. Essential hypertension Stable con't metoprolol - metoprolol (LOPRESSOR) 50 MG tablet; Take 1 tablet (50 mg total) by mouth 2 (two) times daily.  Dispense: 180 tablet; Refill: 3

## 2014-12-20 NOTE — Patient Instructions (Signed)
Preventive Care for Adults A healthy lifestyle and preventive care can promote health and wellness. Preventive health guidelines for women include the following key practices.  A routine yearly physical is a good way to check with your health care provider about your health and preventive screening. It is a chance to share any concerns and updates on your health and to receive a thorough exam.  Visit your dentist for a routine exam and preventive care every 6 months. Brush your teeth twice a day and floss once a day. Good oral hygiene prevents tooth decay and gum disease.  The frequency of eye exams is based on your age, health, family medical history, use of contact lenses, and other factors. Follow your health care provider's recommendations for frequency of eye exams.  Eat a healthy diet. Foods like vegetables, fruits, whole grains, low-fat dairy products, and lean protein foods contain the nutrients you need without too many calories. Decrease your intake of foods high in solid fats, added sugars, and salt. Eat the right amount of calories for you.Get information about a proper diet from your health care provider, if necessary.  Regular physical exercise is one of the most important things you can do for your health. Most adults should get at least 150 minutes of moderate-intensity exercise (any activity that increases your heart rate and causes you to sweat) each week. In addition, most adults need muscle-strengthening exercises on 2 or more days a week.  Maintain a healthy weight. The body mass index (BMI) is a screening tool to identify possible weight problems. It provides an estimate of body fat based on height and weight. Your health care provider can find your BMI and can help you achieve or maintain a healthy weight.For adults 20 years and older:  A BMI below 18.5 is considered underweight.  A BMI of 18.5 to 24.9 is normal.  A BMI of 25 to 29.9 is considered overweight.  A BMI of  30 and above is considered obese.  Maintain normal blood lipids and cholesterol levels by exercising and minimizing your intake of saturated fat. Eat a balanced diet with plenty of fruit and vegetables. Blood tests for lipids and cholesterol should begin at age 76 and be repeated every 5 years. If your lipid or cholesterol levels are high, you are over 50, or you are at high risk for heart disease, you may need your cholesterol levels checked more frequently.Ongoing high lipid and cholesterol levels should be treated with medicines if diet and exercise are not working.  If you smoke, find out from your health care provider how to quit. If you do not use tobacco, do not start.  Lung cancer screening is recommended for adults aged 22-80 years who are at high risk for developing lung cancer because of a history of smoking. A yearly low-dose CT scan of the lungs is recommended for people who have at least a 30-pack-year history of smoking and are a current smoker or have quit within the past 15 years. A pack year of smoking is smoking an average of 1 pack of cigarettes a day for 1 year (for example: 1 pack a day for 30 years or 2 packs a day for 15 years). Yearly screening should continue until the smoker has stopped smoking for at least 15 years. Yearly screening should be stopped for people who develop a health problem that would prevent them from having lung cancer treatment.  If you are pregnant, do not drink alcohol. If you are breastfeeding,  be very cautious about drinking alcohol. If you are not pregnant and choose to drink alcohol, do not have more than 1 drink per day. One drink is considered to be 12 ounces (355 mL) of beer, 5 ounces (148 mL) of wine, or 1.5 ounces (44 mL) of liquor.  Avoid use of street drugs. Do not share needles with anyone. Ask for help if you need support or instructions about stopping the use of drugs.  High blood pressure causes heart disease and increases the risk of  stroke. Your blood pressure should be checked at least every 1 to 2 years. Ongoing high blood pressure should be treated with medicines if weight loss and exercise do not work.  If you are 3-86 years old, ask your health care provider if you should take aspirin to prevent strokes.  Diabetes screening involves taking a blood sample to check your fasting blood sugar level. This should be done once every 3 years, after age 67, if you are within normal weight and without risk factors for diabetes. Testing should be considered at a younger age or be carried out more frequently if you are overweight and have at least 1 risk factor for diabetes.  Breast cancer screening is essential preventive care for women. You should practice "breast self-awareness." This means understanding the normal appearance and feel of your breasts and may include breast self-examination. Any changes detected, no matter how small, should be reported to a health care provider. Women in their 8s and 30s should have a clinical breast exam (CBE) by a health care provider as part of a regular health exam every 1 to 3 years. After age 70, women should have a CBE every year. Starting at age 25, women should consider having a mammogram (breast X-ray test) every year. Women who have a family history of breast cancer should talk to their health care provider about genetic screening. Women at a high risk of breast cancer should talk to their health care providers about having an MRI and a mammogram every year.  Breast cancer gene (BRCA)-related cancer risk assessment is recommended for women who have family members with BRCA-related cancers. BRCA-related cancers include breast, ovarian, tubal, and peritoneal cancers. Having family members with these cancers may be associated with an increased risk for harmful changes (mutations) in the breast cancer genes BRCA1 and BRCA2. Results of the assessment will determine the need for genetic counseling and  BRCA1 and BRCA2 testing.  Routine pelvic exams to screen for cancer are no longer recommended for nonpregnant women who are considered low risk for cancer of the pelvic organs (ovaries, uterus, and vagina) and who do not have symptoms. Ask your health care provider if a screening pelvic exam is right for you.  If you have had past treatment for cervical cancer or a condition that could lead to cancer, you need Pap tests and screening for cancer for at least 20 years after your treatment. If Pap tests have been discontinued, your risk factors (such as having a new sexual partner) need to be reassessed to determine if screening should be resumed. Some women have medical problems that increase the chance of getting cervical cancer. In these cases, your health care provider may recommend more frequent screening and Pap tests.  The HPV test is an additional test that may be used for cervical cancer screening. The HPV test looks for the virus that can cause the cell changes on the cervix. The cells collected during the Pap test can be  tested for HPV. The HPV test could be used to screen women aged 30 years and older, and should be used in women of any age who have unclear Pap test results. After the age of 30, women should have HPV testing at the same frequency as a Pap test.  Colorectal cancer can be detected and often prevented. Most routine colorectal cancer screening begins at the age of 50 years and continues through age 75 years. However, your health care provider may recommend screening at an earlier age if you have risk factors for colon cancer. On a yearly basis, your health care provider may provide home test kits to check for hidden blood in the stool. Use of a small camera at the end of a tube, to directly examine the colon (sigmoidoscopy or colonoscopy), can detect the earliest forms of colorectal cancer. Talk to your health care provider about this at age 50, when routine screening begins. Direct  exam of the colon should be repeated every 5-10 years through age 75 years, unless early forms of pre-cancerous polyps or small growths are found.  People who are at an increased risk for hepatitis B should be screened for this virus. You are considered at high risk for hepatitis B if:  You were born in a country where hepatitis B occurs often. Talk with your health care provider about which countries are considered high risk.  Your parents were born in a high-risk country and you have not received a shot to protect against hepatitis B (hepatitis B vaccine).  You have HIV or AIDS.  You use needles to inject street drugs.  You live with, or have sex with, someone who has hepatitis B.  You get hemodialysis treatment.  You take certain medicines for conditions like cancer, organ transplantation, and autoimmune conditions.  Hepatitis C blood testing is recommended for all people born from 1945 through 1965 and any individual with known risks for hepatitis C.  Practice safe sex. Use condoms and avoid high-risk sexual practices to reduce the spread of sexually transmitted infections (STIs). STIs include gonorrhea, chlamydia, syphilis, trichomonas, herpes, HPV, and human immunodeficiency virus (HIV). Herpes, HIV, and HPV are viral illnesses that have no cure. They can result in disability, cancer, and death.  You should be screened for sexually transmitted illnesses (STIs) including gonorrhea and chlamydia if:  You are sexually active and are younger than 24 years.  You are older than 24 years and your health care provider tells you that you are at risk for this type of infection.  Your sexual activity has changed since you were last screened and you are at an increased risk for chlamydia or gonorrhea. Ask your health care provider if you are at risk.  If you are at risk of being infected with HIV, it is recommended that you take a prescription medicine daily to prevent HIV infection. This is  called preexposure prophylaxis (PrEP). You are considered at risk if:  You are a heterosexual woman, are sexually active, and are at increased risk for HIV infection.  You take drugs by injection.  You are sexually active with a partner who has HIV.  Talk with your health care provider about whether you are at high risk of being infected with HIV. If you choose to begin PrEP, you should first be tested for HIV. You should then be tested every 3 months for as long as you are taking PrEP.  Osteoporosis is a disease in which the bones lose minerals and strength   with aging. This can result in serious bone fractures or breaks. The risk of osteoporosis can be identified using a bone density scan. Women ages 65 years and over and women at risk for fractures or osteoporosis should discuss screening with their health care providers. Ask your health care provider whether you should take a calcium supplement or vitamin D to reduce the rate of osteoporosis.  Menopause can be associated with physical symptoms and risks. Hormone replacement therapy is available to decrease symptoms and risks. You should talk to your health care provider about whether hormone replacement therapy is right for you.  Use sunscreen. Apply sunscreen liberally and repeatedly throughout the day. You should seek shade when your shadow is shorter than you. Protect yourself by wearing long sleeves, pants, a wide-brimmed hat, and sunglasses year round, whenever you are outdoors.  Once a month, do a whole body skin exam, using a mirror to look at the skin on your back. Tell your health care provider of new moles, moles that have irregular borders, moles that are larger than a pencil eraser, or moles that have changed in shape or color.  Stay current with required vaccines (immunizations).  Influenza vaccine. All adults should be immunized every year.  Tetanus, diphtheria, and acellular pertussis (Td, Tdap) vaccine. Pregnant women should  receive 1 dose of Tdap vaccine during each pregnancy. The dose should be obtained regardless of the length of time since the last dose. Immunization is preferred during the 27th-36th week of gestation. An adult who has not previously received Tdap or who does not know her vaccine status should receive 1 dose of Tdap. This initial dose should be followed by tetanus and diphtheria toxoids (Td) booster doses every 10 years. Adults with an unknown or incomplete history of completing a 3-dose immunization series with Td-containing vaccines should begin or complete a primary immunization series including a Tdap dose. Adults should receive a Td booster every 10 years.  Varicella vaccine. An adult without evidence of immunity to varicella should receive 2 doses or a second dose if she has previously received 1 dose. Pregnant females who do not have evidence of immunity should receive the first dose after pregnancy. This first dose should be obtained before leaving the health care facility. The second dose should be obtained 4-8 weeks after the first dose.  Human papillomavirus (HPV) vaccine. Females aged 13-26 years who have not received the vaccine previously should obtain the 3-dose series. The vaccine is not recommended for use in pregnant females. However, pregnancy testing is not needed before receiving a dose. If a female is found to be pregnant after receiving a dose, no treatment is needed. In that case, the remaining doses should be delayed until after the pregnancy. Immunization is recommended for any person with an immunocompromised condition through the age of 26 years if she did not get any or all doses earlier. During the 3-dose series, the second dose should be obtained 4-8 weeks after the first dose. The third dose should be obtained 24 weeks after the first dose and 16 weeks after the second dose.  Zoster vaccine. One dose is recommended for adults aged 60 years or older unless certain conditions are  present.  Measles, mumps, and rubella (MMR) vaccine. Adults born before 1957 generally are considered immune to measles and mumps. Adults born in 1957 or later should have 1 or more doses of MMR vaccine unless there is a contraindication to the vaccine or there is laboratory evidence of immunity to   each of the three diseases. A routine second dose of MMR vaccine should be obtained at least 28 days after the first dose for students attending postsecondary schools, health care workers, or international travelers. People who received inactivated measles vaccine or an unknown type of measles vaccine during 1963-1967 should receive 2 doses of MMR vaccine. People who received inactivated mumps vaccine or an unknown type of mumps vaccine before 1979 and are at high risk for mumps infection should consider immunization with 2 doses of MMR vaccine. For females of childbearing age, rubella immunity should be determined. If there is no evidence of immunity, females who are not pregnant should be vaccinated. If there is no evidence of immunity, females who are pregnant should delay immunization until after pregnancy. Unvaccinated health care workers born before 1957 who lack laboratory evidence of measles, mumps, or rubella immunity or laboratory confirmation of disease should consider measles and mumps immunization with 2 doses of MMR vaccine or rubella immunization with 1 dose of MMR vaccine.  Pneumococcal 13-valent conjugate (PCV13) vaccine. When indicated, a person who is uncertain of her immunization history and has no record of immunization should receive the PCV13 vaccine. An adult aged 19 years or older who has certain medical conditions and has not been previously immunized should receive 1 dose of PCV13 vaccine. This PCV13 should be followed with a dose of pneumococcal polysaccharide (PPSV23) vaccine. The PPSV23 vaccine dose should be obtained at least 8 weeks after the dose of PCV13 vaccine. An adult aged 19  years or older who has certain medical conditions and previously received 1 or more doses of PPSV23 vaccine should receive 1 dose of PCV13. The PCV13 vaccine dose should be obtained 1 or more years after the last PPSV23 vaccine dose.  Pneumococcal polysaccharide (PPSV23) vaccine. When PCV13 is also indicated, PCV13 should be obtained first. All adults aged 65 years and older should be immunized. An adult younger than age 65 years who has certain medical conditions should be immunized. Any person who resides in a nursing home or long-term care facility should be immunized. An adult smoker should be immunized. People with an immunocompromised condition and certain other conditions should receive both PCV13 and PPSV23 vaccines. People with human immunodeficiency virus (HIV) infection should be immunized as soon as possible after diagnosis. Immunization during chemotherapy or radiation therapy should be avoided. Routine use of PPSV23 vaccine is not recommended for American Indians, [place] Natives, or people younger than 65 years unless there are medical conditions that require PPSV23 vaccine. When indicated, people who have unknown immunization and have no record of immunization should receive PPSV23 vaccine. One-time revaccination 5 years after the first dose of PPSV23 is recommended for people aged 19-64 years who have chronic kidney failure, nephrotic syndrome, asplenia, or immunocompromised conditions. People who received 1-2 doses of PPSV23 before age 65 years should receive another dose of PPSV23 vaccine at age 65 years or later if at least 5 years have passed since the previous dose. Doses of PPSV23 are not needed for people immunized with PPSV23 at or after age 65 years.  Meningococcal vaccine. Adults with asplenia or persistent complement component deficiencies should receive 2 doses of quadrivalent meningococcal conjugate (MenACWY-D) vaccine. The doses should be obtained at least 2 months apart.  Microbiologists working with certain meningococcal bacteria, military recruits, people at risk during an outbreak, and people who travel to or live in countries with a high rate of meningitis should be immunized. A first-year college student up through age   21 years who is living in a residence hall should receive a dose if she did not receive a dose on or after her 16th birthday. Adults who have certain high-risk conditions should receive one or more doses of vaccine.  Hepatitis A vaccine. Adults who wish to be protected from this disease, have certain high-risk conditions, work with hepatitis A-infected animals, work in hepatitis A research labs, or travel to or work in countries with a high rate of hepatitis A should be immunized. Adults who were previously unvaccinated and who anticipate close contact with an international adoptee during the first 60 days after arrival in the [place] from a country with a high rate of hepatitis A should be immunized.  Hepatitis B vaccine. Adults who wish to be protected from this disease, have certain high-risk conditions, may be exposed to blood or other infectious body fluids, are household contacts or sex partners of hepatitis B positive people, are clients or workers in certain care facilities, or travel to or work in countries with a high rate of hepatitis B should be immunized.  Haemophilus influenzae type b (Hib) vaccine. A previously unvaccinated person with asplenia or sickle cell disease or having a scheduled splenectomy should receive 1 dose of Hib vaccine. Regardless of previous immunization, a recipient of a hematopoietic stem cell transplant should receive a 3-dose series 6-12 months after her successful transplant. Hib vaccine is not recommended for adults with HIV infection. Preventive Services / Frequency Ages 64 to 68 years  Blood pressure check.** / Every 1 to 2 years.  Lipid and cholesterol check.** / Every 5 years beginning at age  22.  Clinical breast exam.** / Every 3 years for women in their 88s and 53s.  BRCA-related cancer risk assessment.** / For women who have family members with a BRCA-related cancer (breast, ovarian, tubal, or peritoneal cancers).  Pap test.** / Every 2 years from ages 90 through 51. Every 3 years starting at age 21 through age 56 or 3 with a history of 3 consecutive normal Pap tests.  HPV screening.** / Every 3 years from ages 24 through ages 1 to 46 with a history of 3 consecutive normal Pap tests.  Hepatitis C blood test.** / For any individual with known risks for hepatitis C.  Skin self-exam. / Monthly.  Influenza vaccine. / Every year.  Tetanus, diphtheria, and acellular pertussis (Tdap, Td) vaccine.** / Consult your health care provider. Pregnant women should receive 1 dose of Tdap vaccine during each pregnancy. 1 dose of Td every 10 years.  Varicella vaccine.** / Consult your health care provider. Pregnant females who do not have evidence of immunity should receive the first dose after pregnancy.  HPV vaccine. / 3 doses over 6 months, if 72 and younger. The vaccine is not recommended for use in pregnant females. However, pregnancy testing is not needed before receiving a dose.  Measles, mumps, rubella (MMR) vaccine.** / You need at least 1 dose of MMR if you were born in 1957 or later. You may also need a 2nd dose. For females of childbearing age, rubella immunity should be determined. If there is no evidence of immunity, females who are not pregnant should be vaccinated. If there is no evidence of immunity, females who are pregnant should delay immunization until after pregnancy.  Pneumococcal 13-valent conjugate (PCV13) vaccine.** / Consult your health care provider.  Pneumococcal polysaccharide (PPSV23) vaccine.** / 1 to 2 doses if you smoke cigarettes or if you have certain conditions.  Meningococcal vaccine.** /  1 dose if you are age 19 to 21 years and a first-year college  student living in a residence hall, or have one of several medical conditions, you need to get vaccinated against meningococcal disease. You may also need additional booster doses.  Hepatitis A vaccine.** / Consult your health care provider.  Hepatitis B vaccine.** / Consult your health care provider.  Haemophilus influenzae type b (Hib) vaccine.** / Consult your health care provider. Ages 40 to 64 years  Blood pressure check.** / Every 1 to 2 years.  Lipid and cholesterol check.** / Every 5 years beginning at age 20 years.  Lung cancer screening. / Every year if you are aged 55-80 years and have a 30-pack-year history of smoking and currently smoke or have quit within the past 15 years. Yearly screening is stopped once you have quit smoking for at least 15 years or develop a health problem that would prevent you from having lung cancer treatment.  Clinical breast exam.** / Every year after age 40 years.  BRCA-related cancer risk assessment.** / For women who have family members with a BRCA-related cancer (breast, ovarian, tubal, or peritoneal cancers).  Mammogram.** / Every year beginning at age 40 years and continuing for as long as you are in good health. Consult with your health care provider.  Pap test.** / Every 3 years starting at age 30 years through age 65 or 70 years with a history of 3 consecutive normal Pap tests.  HPV screening.** / Every 3 years from ages 30 years through ages 65 to 70 years with a history of 3 consecutive normal Pap tests.  Fecal occult blood test (FOBT) of stool. / Every year beginning at age 50 years and continuing until age 75 years. You may not need to do this test if you get a colonoscopy every 10 years.  Flexible sigmoidoscopy or colonoscopy.** / Every 5 years for a flexible sigmoidoscopy or every 10 years for a colonoscopy beginning at age 50 years and continuing until age 75 years.  Hepatitis C blood test.** / For all people born from 1945 through  1965 and any individual with known risks for hepatitis C.  Skin self-exam. / Monthly.  Influenza vaccine. / Every year.  Tetanus, diphtheria, and acellular pertussis (Tdap/Td) vaccine.** / Consult your health care provider. Pregnant women should receive 1 dose of Tdap vaccine during each pregnancy. 1 dose of Td every 10 years.  Varicella vaccine.** / Consult your health care provider. Pregnant females who do not have evidence of immunity should receive the first dose after pregnancy.  Zoster vaccine.** / 1 dose for adults aged 60 years or older.  Measles, mumps, rubella (MMR) vaccine.** / You need at least 1 dose of MMR if you were born in 1957 or later. You may also need a 2nd dose. For females of childbearing age, rubella immunity should be determined. If there is no evidence of immunity, females who are not pregnant should be vaccinated. If there is no evidence of immunity, females who are pregnant should delay immunization until after pregnancy.  Pneumococcal 13-valent conjugate (PCV13) vaccine.** / Consult your health care provider.  Pneumococcal polysaccharide (PPSV23) vaccine.** / 1 to 2 doses if you smoke cigarettes or if you have certain conditions.  Meningococcal vaccine.** / Consult your health care provider.  Hepatitis A vaccine.** / Consult your health care provider.  Hepatitis B vaccine.** / Consult your health care provider.  Haemophilus influenzae type b (Hib) vaccine.** / Consult your health care provider. Ages 65   years and over  Blood pressure check.** / Every 1 to 2 years.  Lipid and cholesterol check.** / Every 5 years beginning at age 22 years.  Lung cancer screening. / Every year if you are aged 73-80 years and have a 30-pack-year history of smoking and currently smoke or have quit within the past 15 years. Yearly screening is stopped once you have quit smoking for at least 15 years or develop a health problem that would prevent you from having lung cancer  treatment.  Clinical breast exam.** / Every year after age 4 years.  BRCA-related cancer risk assessment.** / For women who have family members with a BRCA-related cancer (breast, ovarian, tubal, or peritoneal cancers).  Mammogram.** / Every year beginning at age 40 years and continuing for as long as you are in good health. Consult with your health care provider.  Pap test.** / Every 3 years starting at age 9 years through age 34 or 91 years with 3 consecutive normal Pap tests. Testing can be stopped between 65 and 70 years with 3 consecutive normal Pap tests and no abnormal Pap or HPV tests in the past 10 years.  HPV screening.** / Every 3 years from ages 57 years through ages 64 or 45 years with a history of 3 consecutive normal Pap tests. Testing can be stopped between 65 and 70 years with 3 consecutive normal Pap tests and no abnormal Pap or HPV tests in the past 10 years.  Fecal occult blood test (FOBT) of stool. / Every year beginning at age 15 years and continuing until age 17 years. You may not need to do this test if you get a colonoscopy every 10 years.  Flexible sigmoidoscopy or colonoscopy.** / Every 5 years for a flexible sigmoidoscopy or every 10 years for a colonoscopy beginning at age 86 years and continuing until age 71 years.  Hepatitis C blood test.** / For all people born from [birth year] through 1966 and any individual with known risks for hepatitis C.  Osteoporosis screening.** / A one-time screening for women ages 83 years and over and women at risk for fractures or osteoporosis.  Skin self-exam. / Monthly.  Influenza vaccine. / Every year.  Tetanus, diphtheria, and acellular pertussis (Tdap/Td) vaccine.** / 1 dose of Td every 10 years.  Varicella vaccine.** / Consult your health care provider.  Zoster vaccine.** / 1 dose for adults aged 61 years or older.  Pneumococcal 13-valent conjugate (PCV13) vaccine.** / Consult your health care provider.  Pneumococcal  polysaccharide (PPSV23) vaccine.** / 1 dose for all adults aged 28 years and older.  Meningococcal vaccine.** / Consult your health care provider.  Hepatitis A vaccine.** / Consult your health care provider.  Hepatitis B vaccine.** / Consult your health care provider.  Haemophilus influenzae type b (Hib) vaccine.** / Consult your health care provider. ** Family history and personal history of risk and conditions may change your health care provider's recommendations. Document Released: 11/10/2001 Document Revised: 01/29/2014 Document Reviewed: 02/09/2011 [facility] Patient Information 2015 [place], [place]. This information is not intended to replace advice given to you by your health care provider. Make sure you discuss any questions you have with your health care provider.

## 2014-12-21 LAB — HIV ANTIBODY (ROUTINE TESTING W REFLEX): HIV 1&2 Ab, 4th Generation: NONREACTIVE

## 2015-03-15 ENCOUNTER — Other Ambulatory Visit: Payer: Self-pay | Admitting: Obstetrics & Gynecology

## 2015-03-15 DIAGNOSIS — N63 Unspecified lump in unspecified breast: Secondary | ICD-10-CM

## 2015-03-20 ENCOUNTER — Ambulatory Visit
Admission: RE | Admit: 2015-03-20 | Discharge: 2015-03-20 | Disposition: A | Discharge status: Home / Self Care | Payer: BLUE CROSS/BLUE SHIELD | Source: Ambulatory Visit | Attending: Obstetrics & Gynecology | Admitting: Obstetrics & Gynecology

## 2015-03-20 DIAGNOSIS — N63 Unspecified lump in unspecified breast: Secondary | ICD-10-CM

## 2015-03-20 LAB — HM PAP SMEAR: HM PAP: NORMAL

## 2015-03-20 LAB — HM DEXA SCAN: HM Dexa Scan: NORMAL

## 2015-03-25 ENCOUNTER — Other Ambulatory Visit: Payer: BLUE CROSS/BLUE SHIELD

## 2015-04-29 ENCOUNTER — Encounter: Payer: Self-pay | Admitting: *Deleted

## 2015-04-29 NOTE — Progress Notes (Signed)
03/20/15 breast ultrasound results in [facility]

## 2015-07-01 ENCOUNTER — Ambulatory Visit: Payer: BLUE CROSS/BLUE SHIELD | Admitting: Family Medicine

## 2015-07-01 ENCOUNTER — Telehealth: Payer: Self-pay | Admitting: Family Medicine

## 2015-07-15 NOTE — Telephone Encounter (Signed)
If first time ---no charge

## 2015-07-15 NOTE — Telephone Encounter (Signed)
Did she give a reason for no show

## 2015-07-15 NOTE — Telephone Encounter (Signed)
No notes but new appt was scheduled 07/01/15 8:29am. I'm assuming pt called in late for appt.

## 2015-07-15 NOTE — Telephone Encounter (Signed)
Pt was no show 07/01/15 8:15am, follow up appt, pt rescheduled for 07/26/15, charge or no charge?

## 2015-07-26 ENCOUNTER — Encounter: Payer: Self-pay | Admitting: Family Medicine

## 2015-07-26 ENCOUNTER — Ambulatory Visit (INDEPENDENT_AMBULATORY_CARE_PROVIDER_SITE_OTHER): Payer: BLUE CROSS/BLUE SHIELD | Admitting: Family Medicine

## 2015-07-26 VITALS — BP 118/72 | HR 64 | Temp 98.3°F | Ht 65.0 in | Wt 150.6 lb

## 2015-07-26 DIAGNOSIS — E785 Hyperlipidemia, unspecified: Secondary | ICD-10-CM

## 2015-07-26 DIAGNOSIS — Z23 Encounter for immunization: Secondary | ICD-10-CM | POA: Diagnosis not present

## 2015-07-26 DIAGNOSIS — I10 Essential (primary) hypertension: Secondary | ICD-10-CM

## 2015-07-26 DIAGNOSIS — Z1159 Encounter for screening for other viral diseases: Secondary | ICD-10-CM

## 2015-07-26 LAB — COMPREHENSIVE METABOLIC PANEL
ALT: 15 U/L (ref 0–35)
AST: 22 U/L (ref 0–37)
Albumin: 4 g/dL (ref 3.5–5.2)
Alkaline Phosphatase: 50 U/L (ref 39–117)
BUN: 19 mg/dL (ref 6–23)
CALCIUM: 9.2 mg/dL (ref 8.4–10.5)
CHLORIDE: 106 meq/L (ref 96–112)
CO2: 27 mEq/L (ref 19–32)
Creatinine, Ser: 0.68 mg/dL (ref 0.40–1.20)
GFR: 93.8 mL/min (ref >60.00)
Glucose, Bld: 89 mg/dL (ref 70–99)
Potassium: 4.1 mEq/L (ref 3.5–5.1)
Sodium: 142 mEq/L (ref 135–145)
Total Bilirubin: 0.5 mg/dL (ref 0.2–1.2)
Total Protein: 7.1 g/dL (ref 6.0–8.3)

## 2015-07-26 LAB — LIPID PANEL
CHOLESTEROL: 176 mg/dL (ref 0–200)
HDL: 64.6 mg/dL (ref >39.00)
LDL Cholesterol: 94 mg/dL (ref 0–99)
NonHDL: 111.39
Total CHOL/HDL Ratio: 3
Triglycerides: 89 mg/dL (ref 0.0–149.0)
VLDL: 17.8 mg/dL (ref 0.0–40.0)

## 2015-07-26 NOTE — Assessment & Plan Note (Signed)
con't metoprolol 

## 2015-07-26 NOTE — Assessment & Plan Note (Signed)
con't lipitor Check labs 

## 2015-07-26 NOTE — Patient Instructions (Signed)

## 2015-07-26 NOTE — Progress Notes (Signed)
Pre visit review using our clinic review tool, if applicable. No additional management support is needed unless otherwise documented below in the visit note. 

## 2015-07-26 NOTE — Progress Notes (Signed)
Patient ID: Janet Knox, female    DOB: May 23, 1956  Age: 59 y.o. MRN: [id number]    Subjective:  Subjective HPI Janet Knox presents for f/u htn and cholesterol.  No complaints.    Review of Systems  Constitutional: Negative for diaphoresis, appetite change, fatigue and unexpected weight change.  Eyes: Negative for pain, redness and visual disturbance.  Respiratory: Negative for cough, chest tightness, shortness of breath and wheezing.   Cardiovascular: Negative for chest pain, palpitations and leg swelling.  Endocrine: Negative for cold intolerance, heat intolerance, polydipsia, polyphagia and polyuria.  Genitourinary: Negative for dysuria, frequency and difficulty urinating.  Neurological: Negative for dizziness, light-headedness, numbness and headaches.    History Past Medical History  Diagnosis Date  . Anemia   . Hyperlipidemia   . Hypertension   . Skin cancer     She has past surgical history that includes Abdominal hysterectomy; Knee surgery; and Eye surgery (Left).   Her family history includes Alzheimer's disease in her mother; Coronary artery disease in her paternal grandfather; Hyperlipidemia in her father and mother; Hypertension in her father and mother; Stroke in her paternal grandfather.She reports that she has never smoked. She has never used smokeless tobacco. She reports that she drinks about 0.6 oz of alcohol per week. She reports that she does not use illicit drugs.  Current Outpatient Prescriptions on File Prior to Visit  Medication Sig Dispense Refill  . aspirin 81 MG tablet Take 81 mg by mouth daily.    Marland Kitchen atorvastatin (LIPITOR) 40 MG tablet Take 1 tablet (40 mg total) by mouth at bedtime. 90 tablet 3  . Cholecalciferol (VITAMIN D3) 5000 UNITS TABS Take 2 tablets by mouth daily.    . Coenzyme Q10 (COQ-10) 200 MG CAPS Take 1 capsule by mouth daily.    Marland Kitchen estradiol (ESTRACE) 2 MG tablet Take 2 mg by mouth daily.    . fenofibrate (TRICOR) 48 MG tablet TAKE  1 TABLET DAILY. 90 tablet 3  . metoprolol (LOPRESSOR) 50 MG tablet Take 1 tablet (50 mg total) by mouth 2 (two) times daily. 180 tablet 3  . Multiple Vitamin (MULTIVITAMIN) tablet Take 1 tablet by mouth daily.    . niacin 500 MG tablet Take 1,000 mg by mouth daily with breakfast.    . Omega-3 Fatty Acids (FISH OIL) 1200 MG CAPS Take 1 capsule by mouth daily.     . vitamin E 400 UNIT capsule Take 400 Units by mouth daily.     No current facility-administered medications on file prior to visit.     Objective:  Objective Physical Exam  Constitutional: She is oriented to person, place, and time. She appears well-developed and well-nourished.  HENT:  Head: Normocephalic and atraumatic.  Eyes: Conjunctivae and EOM are normal.  Neck: Normal range of motion. Neck supple. No JVD present. Carotid bruit is not present. No thyromegaly present.  Cardiovascular: Normal rate, regular rhythm and normal heart sounds.   No murmur heard. Pulmonary/Chest: Effort normal and breath sounds normal. No respiratory distress. She has no wheezes. She has no rales. She exhibits no tenderness.  Musculoskeletal: She exhibits no edema.  Neurological: She is alert and oriented to person, place, and time.  Psychiatric: She has a normal mood and affect. Her behavior is normal.  Nursing note and vitals reviewed.  BP 118/72 mmHg  Pulse 64  Temp(Src) 98.3 F (36.8 C) (Oral)  Ht '5\' 5"'  (1.651 m)  Wt 150 lb 9.6 oz (68.312 kg)  BMI 25.06 kg/m2  SpO2 98% Wt Readings from Last 3 Encounters:  07/26/15 150 lb 9.6 oz (68.312 kg)  12/20/14 141 lb (63.957 kg)  11/01/13 146 lb (66.225 kg)     Lab Results  Component Value Date   WBC 4.1 12/20/2014   HGB 11.5* 12/20/2014   HCT 34.3* 12/20/2014   PLT 267.0 12/20/2014   GLUCOSE 86 12/20/2014   CHOL 168 12/20/2014   TRIG 97.0 12/20/2014   HDL 59.70 12/20/2014   LDLDIRECT 117.5 01/27/2010   LDLCALC 89 12/20/2014   ALT 13 12/20/2014   AST 21 12/20/2014   NA 138  12/20/2014   K 3.4* 12/20/2014   CL 105 12/20/2014   CREATININE 0.69 12/20/2014   BUN 26* 12/20/2014   CO2 27 12/20/2014   TSH 2.77 12/20/2014   MICROALBUR 0.7 03/22/2013    US Breast Ltd Uni Left Inc Axilla  03/27/2015  CLINICAL DATA:  59 year old female with a palpable abnormality felt in her left breast by her physician. The order states the palpable abnormality is at the 3 o'clock location, however the patient states that the palpable abnormality was really at the 9 o'clock location. EXAM: DIGITAL DIAGNOSTIC LEFT MAMMOGRAM WITH 3D TOMOSYNTHESIS AND CAD LEFT BREAST ULTRASOUND COMPARISON:  Previous exams. ACR Breast Density Category c: The breast tissue is heterogeneously dense, which may obscure small masses. FINDINGS: No suspicious masses or calcifications are seen in the left breast. There are no mammographic findings of malignancy in the left breast. Mammographic images were processed with CAD. Physical examination of the outer and inner left breast does not reveal any palpable masses. Targeted ultrasound of the left breast was performed. No discrete masses or abnormalities are seen, only normal appearing fibroglandular tissue is visualized. The inner left breast as well as the outer left breast was scanned. IMPRESSION: 1. No mammographic or sonographic correlate for the palpable abnormality in the left breast. 2.  No mammographic evidence of malignancy in the left breast. RECOMMENDATION: 1. Recommend further management of the left breast palpable abnormality be based on clinical assessment. 2.  Screening mammogram in one year.(Code:SM-B-01Y) I have discussed the findings and recommendations with the patient. Results were also provided in writing at the conclusion of the visit. If applicable, a reminder letter will be sent to the patient regarding the next appointment. BI-RADS CATEGORY  1: Negative. Electronically Signed   By: Everlean Alstrom M.D.   On: 03/20/2015 11:19   Mm Diag Breast Tomo Uni  Left  03/20/2015  CLINICAL DATA:  58 year old female with a palpable abnormality felt in her left breast by her physician. The order states the palpable abnormality is at the 3 o'clock location, however the patient states that the palpable abnormality was really at the 9 o'clock location. EXAM: DIGITAL DIAGNOSTIC LEFT MAMMOGRAM WITH 3D TOMOSYNTHESIS AND CAD LEFT BREAST ULTRASOUND COMPARISON:  Previous exams. ACR Breast Density Category c: The breast tissue is heterogeneously dense, which may obscure small masses. FINDINGS: No suspicious masses or calcifications are seen in the left breast. There are no mammographic findings of malignancy in the left breast. Mammographic images were processed with CAD. Physical examination of the outer and inner left breast does not reveal any palpable masses. Targeted ultrasound of the left breast was performed. No discrete masses or abnormalities are seen, only normal appearing fibroglandular tissue is visualized. The inner left breast as well as the outer left breast was scanned. IMPRESSION: 1. No mammographic or sonographic correlate for the palpable abnormality in the left breast. 2.  No mammographic  evidence of malignancy in the left breast. RECOMMENDATION: 1. Recommend further management of the left breast palpable abnormality be based on clinical assessment. 2.  Screening mammogram in one year.(Code:SM-B-01Y) I have discussed the findings and recommendations with the patient. Results were also provided in writing at the conclusion of the visit. If applicable, a reminder letter will be sent to the patient regarding the next appointment. BI-RADS CATEGORY  1: Negative. Electronically Signed   By: Everlean Alstrom M.D.   On: 03/20/2015 11:19     Assessment & Plan:  Plan I am having Ms. Merrihew maintain her niacin, vitamin E, multivitamin, aspirin, estradiol, CoQ-10, Fish Oil, Vitamin D3, atorvastatin, fenofibrate, metoprolol, and Ferrous Sulfate.  Meds ordered this  encounter  Medications  . Ferrous Sulfate (HIGH POTENCY IRON) 134 MG TABS    Sig: Take 1 tablet by mouth daily.    Problem List Items Addressed This Visit    Hyperlipidemia - Primary    con't lipitor Check labs      Relevant Orders   Comp Met (CMET)   Lipid panel   Essential hypertension    con't metoprolol      Relevant Orders   Comp Met (CMET)   Lipid panel    Other Visit Diagnoses    Need for hepatitis C screening test        Relevant Orders    Hepatitis C antibody    Need for immunization against influenza        Relevant Orders    Flu Vaccine QUAD 36+ mos IM (Fluarix) (Completed)       Follow-up: Return in about 6 months (around 01/24/2016), or if symptoms worsen or fail to improve, for annual exam, fasting.  Garnet Koyanagi, DO

## 2015-07-27 LAB — HEPATITIS C ANTIBODY: HCV AB: NEGATIVE

## 2015-07-31 ENCOUNTER — Ambulatory Visit: Payer: BLUE CROSS/BLUE SHIELD

## 2015-08-01 ENCOUNTER — Encounter: Payer: Self-pay | Admitting: Family Medicine

## 2015-08-01 NOTE — Telephone Encounter (Signed)
[place] for zostavax

## 2015-08-07 NOTE — Telephone Encounter (Signed)
She is good, the order gets put in when she gets here,       KP

## 2015-08-07 NOTE — Telephone Encounter (Signed)
Pt would like to make sure that vaccination is in.  Will go ahead and schedule pt an appt.

## 2015-08-13 ENCOUNTER — Ambulatory Visit (INDEPENDENT_AMBULATORY_CARE_PROVIDER_SITE_OTHER): Payer: BLUE CROSS/BLUE SHIELD | Admitting: Behavioral Health

## 2015-08-13 DIAGNOSIS — Z23 Encounter for immunization: Secondary | ICD-10-CM | POA: Diagnosis not present

## 2015-08-13 NOTE — Progress Notes (Signed)
Pre visit review using our clinic review tool, if applicable. No additional management support is needed unless otherwise documented below in the visit note. 

## 2015-09-25 LAB — HM COLONOSCOPY: HM COLON: NORMAL

## 2015-10-09 ENCOUNTER — Encounter: Payer: Self-pay | Admitting: Family Medicine

## 2015-12-24 ENCOUNTER — Ambulatory Visit: Payer: BLUE CROSS/BLUE SHIELD | Admitting: Family Medicine

## 2015-12-30 ENCOUNTER — Encounter: Payer: Self-pay | Admitting: Behavioral Health

## 2015-12-30 ENCOUNTER — Telehealth: Payer: Self-pay | Admitting: Behavioral Health

## 2015-12-30 NOTE — Telephone Encounter (Signed)
Pre-Visit Call completed with patient and chart updated.   Pre-Visit Info documented in Specialty Comments under [employer].    

## 2015-12-31 ENCOUNTER — Ambulatory Visit (INDEPENDENT_AMBULATORY_CARE_PROVIDER_SITE_OTHER): Payer: BLUE CROSS/BLUE SHIELD | Admitting: Family Medicine

## 2015-12-31 ENCOUNTER — Encounter: Payer: Self-pay | Admitting: Family Medicine

## 2015-12-31 VITALS — BP 116/72 | HR 61 | Temp 98.5°F | Ht 64.75 in | Wt 152.4 lb

## 2015-12-31 DIAGNOSIS — I10 Essential (primary) hypertension: Secondary | ICD-10-CM

## 2015-12-31 DIAGNOSIS — Z23 Encounter for immunization: Secondary | ICD-10-CM | POA: Diagnosis not present

## 2015-12-31 DIAGNOSIS — E785 Hyperlipidemia, unspecified: Secondary | ICD-10-CM | POA: Diagnosis not present

## 2015-12-31 DIAGNOSIS — Z0000 Encounter for general adult medical examination without abnormal findings: Secondary | ICD-10-CM

## 2015-12-31 DIAGNOSIS — Z780 Asymptomatic menopausal state: Secondary | ICD-10-CM

## 2015-12-31 LAB — COMPREHENSIVE METABOLIC PANEL
ALBUMIN: 4.2 g/dL (ref 3.5–5.2)
ALT: 18 U/L (ref 0–35)
AST: 23 U/L (ref 0–37)
Alkaline Phosphatase: 46 U/L (ref 39–117)
BILIRUBIN TOTAL: 0.5 mg/dL (ref 0.2–1.2)
BUN: 24 mg/dL — AB (ref 6–23)
CALCIUM: 9.2 mg/dL (ref 8.4–10.5)
CHLORIDE: 105 meq/L (ref 96–112)
CO2: 28 mEq/L (ref 19–32)
CREATININE: 0.67 mg/dL (ref 0.40–1.20)
GFR: 95.28 mL/min (ref >60.00)
Glucose, Bld: 92 mg/dL (ref 70–99)
Potassium: 4.4 mEq/L (ref 3.5–5.1)
SODIUM: 138 meq/L (ref 135–145)
Total Protein: 7.2 g/dL (ref 6.0–8.3)

## 2015-12-31 LAB — LIPID PANEL
CHOL/HDL RATIO: 3
CHOLESTEROL: 181 mg/dL (ref 0–200)
HDL: 62.1 mg/dL (ref >39.00)
LDL CALC: 93 mg/dL (ref 0–99)
NONHDL: 118.99
Triglycerides: 129 mg/dL (ref 0.0–149.0)
VLDL: 25.8 mg/dL (ref 0.0–40.0)

## 2015-12-31 LAB — CBC WITH DIFFERENTIAL/PLATELET
BASOS ABS: 0 10*3/uL (ref 0.0–0.1)
Basophils Relative: 0.5 % (ref 0.0–3.0)
EOS ABS: 0.1 10*3/uL (ref 0.0–0.7)
Eosinophils Relative: 2.5 % (ref 0.0–5.0)
HEMATOCRIT: 36.2 % (ref 36.0–46.0)
HEMOGLOBIN: 12 g/dL (ref 12.0–15.0)
LYMPHS PCT: 30.7 % (ref 12.0–46.0)
Lymphs Abs: 1.4 10*3/uL (ref 0.7–4.0)
MCHC: 33.2 g/dL (ref 30.0–36.0)
MCV: 92.9 fl (ref 78.0–100.0)
MONO ABS: 0.4 10*3/uL (ref 0.1–1.0)
Monocytes Relative: 9 % (ref 3.0–12.0)
Neutro Abs: 2.5 10*3/uL (ref 1.4–7.7)
Neutrophils Relative %: 57.3 % (ref 43.0–77.0)
Platelets: 280 10*3/uL (ref 150.0–400.0)
RBC: 3.9 Mil/uL (ref 3.87–5.11)
RDW: 13.7 % (ref 11.5–15.5)
WBC: 4.4 10*3/uL (ref 4.0–10.5)

## 2015-12-31 LAB — TSH: TSH: 3.13 u[IU]/mL (ref 0.35–4.50)

## 2015-12-31 MED ORDER — METOPROLOL TARTRATE 50 MG PO TABS: 50.0000 mg | ORAL_TABLET | Freq: Two times a day (BID) | ORAL | Status: DC

## 2015-12-31 MED ORDER — ESTRADIOL 2 MG PO TABS: 2.0000 mg | ORAL_TABLET | Freq: Every day | ORAL | Status: DC

## 2015-12-31 MED ORDER — ATORVASTATIN CALCIUM 40 MG PO TABS: 40.0000 mg | ORAL_TABLET | Freq: Every day | ORAL | Status: DC

## 2015-12-31 MED ORDER — NIACIN 500 MG PO TABS: 1000.0000 mg | ORAL_TABLET | Freq: Every day | ORAL | Status: AC

## 2015-12-31 MED ORDER — FENOFIBRATE 48 MG PO TABS: ORAL_TABLET | ORAL | Status: DC

## 2015-12-31 NOTE — Progress Notes (Signed)
Plan:      Subjective:     Janet Knox is a 60 y.o. female and is here for a comprehensive physical exam. The patient reports no problems.  Social History   Social History  . Marital Status: Married    Spouse Name: N/A  . Number of Children: N/A  . Years of Education: N/A   Occupational History  . [employer]    Social History Main Topics  . Smoking status: Never Smoker   . Smokeless tobacco: Never Used  . Alcohol Use: 0.6 oz/week    1 Glasses of wine per week  . Drug Use: No  . Sexual Activity:    Partners: Male   Other Topics Concern  . Not on file   Social History Narrative   Exercise-- walks 4 miles 4-5 x a week---treadmill and stationary bike   Health Maintenance  Topic Date Due  . INFLUENZA VACCINE  04/28/2016  . MAMMOGRAM  03/19/2017  . DEXA SCAN  03/19/2017  . PAP SMEAR  03/19/2018  . TETANUS/TDAP  03/23/2023  . COLONOSCOPY  09/24/2025  . ZOSTAVAX  Completed  . Hepatitis C Screening  Completed  . HIV Screening  Completed    The following portions of the patient's history were reviewed and updated as appropriate:  She  has a past medical history of Anemia; Hyperlipidemia; Hypertension; and Skin cancer. She  does not have any pertinent problems on file. She  has past surgical history that includes Abdominal hysterectomy; Knee surgery; and Eye surgery (Left). Her family history includes Alzheimer's disease in her mother; Coronary artery disease in her paternal grandfather; Hyperlipidemia in her father and mother; Hypertension in her father and mother; Stroke in her paternal grandfather. She  reports that she has never smoked. She has never used smokeless tobacco. She reports that she drinks about 0.6 oz of alcohol per week. She reports that she does not use illicit drugs. She has a current medication list which includes the following prescription(s): aspirin, atorvastatin, vitamin d3, coq-10, estradiol, fenofibrate, ferrous sulfate,  metoprolol, multivitamin, niacin, fish oil, travoprost (bak free), and vitamin e. Current Outpatient Prescriptions on File Prior to Visit  Medication Sig Dispense Refill  . aspirin 81 MG tablet Take 81 mg by mouth daily.    . Cholecalciferol (VITAMIN D3) 5000 UNITS TABS Take 1 tablet by mouth daily.     . Coenzyme Q10 (COQ-10) 200 MG CAPS Take 1 capsule by mouth daily.    . Ferrous Sulfate (HIGH POTENCY IRON) 134 MG TABS Take 1 tablet by mouth daily.    . Multiple Vitamin (MULTIVITAMIN) tablet Take 1 tablet by mouth daily.    . Omega-3 Fatty Acids (FISH OIL) 1200 MG CAPS Take 1 capsule by mouth daily.     . Travoprost, BAK Free, (TRAVATAN) 0.004 % SOLN ophthalmic solution Place 1 drop into the left eye at bedtime.    . vitamin E 400 UNIT capsule Take 400 Units by mouth daily.     No current facility-administered medications on file prior to visit.   She has No Known Allergies..  Review of Systems Review of Systems  Constitutional: Negative for activity change, appetite change and fatigue.  HENT: Negative for hearing loss, congestion, tinnitus and ear discharge.  dentist q90m Eyes: Negative for visual disturbance (see optho q1y -- vision corrected to 20/20 with glasses).  Respiratory: Negative for cough, chest tightness and shortness of breath.   Cardiovascular: Negative for chest pain, palpitations  and leg swelling.  Gastrointestinal: Negative for abdominal pain, diarrhea, constipation and abdominal distention.  Genitourinary: Negative for urgency, frequency, decreased urine volume and difficulty urinating.  Musculoskeletal: Negative for back pain, arthralgias and gait problem.  Skin: Negative for color change, pallor and rash.  Neurological: Negative for dizziness, light-headedness, numbness and headaches.  Hematological: Negative for adenopathy. Does not bruise/bleed easily.  Psychiatric/Behavioral: Negative for suicidal ideas, confusion, sleep disturbance, self-injury, dysphoric mood,  decreased concentration and agitation.       Objective:    BP 116/72 mmHg  Pulse 61  Temp(Src) 98.5 F (36.9 C) (Oral)  Ht 5' 4.75" (1.645 m)  Wt 152 lb 6.4 oz (69.128 kg)  BMI 25.55 kg/m2  SpO2 98% General appearance: alert, cooperative, appears stated age and no distress Head: Normocephalic, without obvious abnormality, atraumatic Eyes: conjunctivae/corneas clear. PERRL, EOM's intact. Fundi benign. Ears: normal TM's and external ear canals both ears Nose: Nares normal. Septum midline. Mucosa normal. No drainage or sinus tenderness. Throat: lips, mucosa, and tongue normal; teeth and gums normal Neck: no adenopathy, no carotid bruit, no JVD, supple, symmetrical, trachea midline and thyroid not enlarged, symmetric, no tenderness/mass/nodules Back: symmetric, no curvature. ROM normal. No CVA tenderness. Lungs: clear to auscultation bilaterally Breasts: gyn Heart: regular rate and rhythm, S1, S2 normal, no murmur, click, rub or gallop Abdomen: soft, non-tender; bowel sounds normal; no masses,  no organomegaly Pelvic: deferred --gyn Extremities: extremities normal, atraumatic, no cyanosis or edema Pulses: 2+ and symmetric Skin: Skin color, texture, turgor normal. No rashes or lesions Lymph nodes: Cervical, supraclavicular, and axillary nodes normal. Neurologic: Alert and oriented X 3, normal strength and tone. Normal symmetric reflexes. Normal coordination and gait Psych-no depression, no anxiety      Assessment:    Healthy female exam.      Plan:    ghm utd Check labs See After Visit Summary for Counseling Recommendations    1. Hyperlipemia Check labs  con't meds - atorvastatin (LIPITOR) 40 MG tablet; Take 1 tablet (40 mg total) by mouth at bedtime.  Dispense: 90 tablet; Refill: 3 - fenofibrate (TRICOR) 48 MG tablet; TAKE 1 TABLET DAILY.  Dispense: 90 tablet; Refill: 3 - niacin 500 MG tablet; Take 2 tablets (1,000 mg total) by mouth daily with breakfast.  Dispense: 180  tablet; Refill: 3 - TSH - POCT urinalysis dipstick - Lipid panel - CBC with Differential/Platelet - Comprehensive metabolic panel  2. Essential hypertension Stable con't meds  - metoprolol (LOPRESSOR) 50 MG tablet; Take 1 tablet (50 mg total) by mouth 2 (two) times daily.  Dispense: 180 tablet; Refill: 3 - TSH - POCT urinalysis dipstick - Lipid panel - CBC with Differential/Platelet - Comprehensive metabolic panel  3. Menopause   - estradiol (ESTRACE) 2 MG tablet; Take 1 tablet (2 mg total) by mouth daily.  Dispense: 90 tablet; Refill: 3  4. Preventative health care See above - TSH - POCT urinalysis dipstick - Lipid panel - CBC with Differential/Platelet - Comprehensive metabolic panel

## 2015-12-31 NOTE — Addendum Note (Signed)
Addended by: Bunnie Domino on: 12/31/2015 09:28 AM   Modules accepted: Orders

## 2015-12-31 NOTE — Patient Instructions (Signed)
Preventive Care for Adults, Female A healthy lifestyle and preventive care can promote health and wellness. Preventive health guidelines for women include the following key practices.  A routine yearly physical is a good way to check with your health care provider about your health and preventive screening. It is a chance to share any concerns and updates on your health and to receive a thorough exam.  Visit your dentist for a routine exam and preventive care every 6 months. Brush your teeth twice a day and floss once a day. Good oral hygiene prevents tooth decay and gum disease.  The frequency of eye exams is based on your age, health, family medical history, use of contact lenses, and other factors. Follow your health care provider's recommendations for frequency of eye exams.  Eat a healthy diet. Foods like vegetables, fruits, whole grains, low-fat dairy products, and lean protein foods contain the nutrients you need without too many calories. Decrease your intake of foods high in solid fats, added sugars, and salt. Eat the right amount of calories for you.Get information about a proper diet from your health care provider, if necessary.  Regular physical exercise is one of the most important things you can do for your health. Most adults should get at least 150 minutes of moderate-intensity exercise (any activity that increases your heart rate and causes you to sweat) each week. In addition, most adults need muscle-strengthening exercises on 2 or more days a week.  Maintain a healthy weight. The body mass index (BMI) is a screening tool to identify possible weight problems. It provides an estimate of body fat based on height and weight. Your health care provider can find your BMI and can help you achieve or maintain a healthy weight.For adults 20 years and older:  A BMI below 18.5 is considered underweight.  A BMI of 18.5 to 24.9 is normal.  A BMI of 25 to 29.9 is considered overweight.  A  BMI of 30 and above is considered obese.  Maintain normal blood lipids and cholesterol levels by exercising and minimizing your intake of saturated fat. Eat a balanced diet with plenty of fruit and vegetables. Blood tests for lipids and cholesterol should begin at age 45 and be repeated every 5 years. If your lipid or cholesterol levels are high, you are over 50, or you are at high risk for heart disease, you may need your cholesterol levels checked more frequently.Ongoing high lipid and cholesterol levels should be treated with medicines if diet and exercise are not working.  If you smoke, find out from your health care provider how to quit. If you do not use tobacco, do not start.  Lung cancer screening is recommended for adults aged 45-80 years who are at high risk for developing lung cancer because of a history of smoking. A yearly low-dose CT scan of the lungs is recommended for people who have at least a 30-pack-year history of smoking and are a current smoker or have quit within the past 15 years. A pack year of smoking is smoking an average of 1 pack of cigarettes a day for 1 year (for example: 1 pack a day for 30 years or 2 packs a day for 15 years). Yearly screening should continue until the smoker has stopped smoking for at least 15 years. Yearly screening should be stopped for people who develop a health problem that would prevent them from having lung cancer treatment.  If you are pregnant, do not drink alcohol. If you are  breastfeeding, be very cautious about drinking alcohol. If you are not pregnant and choose to drink alcohol, do not have more than 1 drink per day. One drink is considered to be 12 ounces (355 mL) of beer, 5 ounces (148 mL) of wine, or 1.5 ounces (44 mL) of liquor.  Avoid use of street drugs. Do not share needles with anyone. Ask for help if you need support or instructions about stopping the use of drugs.  High blood pressure causes heart disease and increases the risk  of stroke. Your blood pressure should be checked at least every 1 to 2 years. Ongoing high blood pressure should be treated with medicines if weight loss and exercise do not work.  If you are 55-79 years old, ask your health care provider if you should take aspirin to prevent strokes.  Diabetes screening is done by taking a blood sample to check your blood glucose level after you have not eaten for a certain period of time (fasting). If you are not overweight and you do not have risk factors for diabetes, you should be screened once every 3 years starting at age 45. If you are overweight or obese and you are 40-70 years of age, you should be screened for diabetes every year as part of your cardiovascular risk assessment.  Breast cancer screening is essential preventive care for women. You should practice "breast self-awareness." This means understanding the normal appearance and feel of your breasts and may include breast self-examination. Any changes detected, no matter how small, should be reported to a health care provider. Women in their 20s and 30s should have a clinical breast exam (CBE) by a health care provider as part of a regular health exam every 1 to 3 years. After age 40, women should have a CBE every year. Starting at age 40, women should consider having a mammogram (breast X-ray test) every year. Women who have a family history of breast cancer should talk to their health care provider about genetic screening. Women at a high risk of breast cancer should talk to their health care providers about having an MRI and a mammogram every year.  Breast cancer gene (BRCA)-related cancer risk assessment is recommended for women who have family members with BRCA-related cancers. BRCA-related cancers include breast, ovarian, tubal, and peritoneal cancers. Having family members with these cancers may be associated with an increased risk for harmful changes (mutations) in the breast cancer genes BRCA1 and  BRCA2. Results of the assessment will determine the need for genetic counseling and BRCA1 and BRCA2 testing.  Your health care provider may recommend that you be screened regularly for cancer of the pelvic organs (ovaries, uterus, and vagina). This screening involves a pelvic examination, including checking for microscopic changes to the surface of your cervix (Pap test). You may be encouraged to have this screening done every 3 years, beginning at age 21.  For women ages 30-65, health care providers may recommend pelvic exams and Pap testing every 3 years, or they may recommend the Pap and pelvic exam, combined with testing for human papilloma virus (HPV), every 5 years. Some types of HPV increase your risk of cervical cancer. Testing for HPV may also be done on women of any age with unclear Pap test results.  Other health care providers may not recommend any screening for nonpregnant women who are considered low risk for pelvic cancer and who do not have symptoms. Ask your health care provider if a screening pelvic exam is right for   you.  If you have had past treatment for cervical cancer or a condition that could lead to cancer, you need Pap tests and screening for cancer for at least 20 years after your treatment. If Pap tests have been discontinued, your risk factors (such as having a new sexual partner) need to be reassessed to determine if screening should resume. Some women have medical problems that increase the chance of getting cervical cancer. In these cases, your health care provider may recommend more frequent screening and Pap tests.  Colorectal cancer can be detected and often prevented. Most routine colorectal cancer screening begins at the age of 50 years and continues through age 75 years. However, your health care provider may recommend screening at an earlier age if you have risk factors for colon cancer. On a yearly basis, your health care provider may provide home test kits to check  for hidden blood in the stool. Use of a small camera at the end of a tube, to directly examine the colon (sigmoidoscopy or colonoscopy), can detect the earliest forms of colorectal cancer. Talk to your health care provider about this at age 50, when routine screening begins. Direct exam of the colon should be repeated every 5-10 years through age 75 years, unless early forms of precancerous polyps or small growths are found.  People who are at an increased risk for hepatitis B should be screened for this virus. You are considered at high risk for hepatitis B if:  You were born in a country where hepatitis B occurs often. Talk with your health care provider about which countries are considered high risk.  Your parents were born in a high-risk country and you have not received a shot to protect against hepatitis B (hepatitis B vaccine).  You have HIV or AIDS.  You use needles to inject street drugs.  You live with, or have sex with, someone who has hepatitis B.  You get hemodialysis treatment.  You take certain medicines for conditions like cancer, organ transplantation, and autoimmune conditions.  Hepatitis C blood testing is recommended for all people born from 1945 through 1965 and any individual with known risks for hepatitis C.  Practice safe sex. Use condoms and avoid high-risk sexual practices to reduce the spread of sexually transmitted infections (STIs). STIs include gonorrhea, chlamydia, syphilis, trichomonas, herpes, HPV, and human immunodeficiency virus (HIV). Herpes, HIV, and HPV are viral illnesses that have no cure. They can result in disability, cancer, and death.  You should be screened for sexually transmitted illnesses (STIs) including gonorrhea and chlamydia if:  You are sexually active and are younger than 24 years.  You are older than 24 years and your health care provider tells you that you are at risk for this type of infection.  Your sexual activity has changed  since you were last screened and you are at an increased risk for chlamydia or gonorrhea. Ask your health care provider if you are at risk.  If you are at risk of being infected with HIV, it is recommended that you take a prescription medicine daily to prevent HIV infection. This is called preexposure prophylaxis (PrEP). You are considered at risk if:  You are sexually active and do not regularly use condoms or know the HIV status of your partner(s).  You take drugs by injection.  You are sexually active with a partner who has HIV.  Talk with your health care provider about whether you are at high risk of being infected with HIV. If   you choose to begin PrEP, you should first be tested for HIV. You should then be tested every 3 months for as long as you are taking PrEP.  Osteoporosis is a disease in which the bones lose minerals and strength with aging. This can result in serious bone fractures or breaks. The risk of osteoporosis can be identified using a bone density scan. Women ages 67 years and over and women at risk for fractures or osteoporosis should discuss screening with their health care providers. Ask your health care provider whether you should take a calcium supplement or vitamin D to reduce the rate of osteoporosis.  Menopause can be associated with physical symptoms and risks. Hormone replacement therapy is available to decrease symptoms and risks. You should talk to your health care provider about whether hormone replacement therapy is right for you.  Use sunscreen. Apply sunscreen liberally and repeatedly throughout the day. You should seek shade when your shadow is shorter than you. Protect yourself by wearing long sleeves, pants, a wide-brimmed hat, and sunglasses year round, whenever you are outdoors.  Once a month, do a whole body skin exam, using a mirror to look at the skin on your back. Tell your health care provider of new moles, moles that have irregular borders, moles that  are larger than a pencil eraser, or moles that have changed in shape or color.  Stay current with required vaccines (immunizations).  Influenza vaccine. All adults should be immunized every year.  Tetanus, diphtheria, and acellular pertussis (Td, Tdap) vaccine. Pregnant women should receive 1 dose of Tdap vaccine during each pregnancy. The dose should be obtained regardless of the length of time since the last dose. Immunization is preferred during the 27th-36th week of gestation. An adult who has not previously received Tdap or who does not know her vaccine status should receive 1 dose of Tdap. This initial dose should be followed by tetanus and diphtheria toxoids (Td) booster doses every 10 years. Adults with an unknown or incomplete history of completing a 3-dose immunization series with Td-containing vaccines should begin or complete a primary immunization series including a Tdap dose. Adults should receive a Td booster every 10 years.  Varicella vaccine. An adult without evidence of immunity to varicella should receive 2 doses or a second dose if she has previously received 1 dose. Pregnant females who do not have evidence of immunity should receive the first dose after pregnancy. This first dose should be obtained before leaving the health care facility. The second dose should be obtained 4-8 weeks after the first dose.  Human papillomavirus (HPV) vaccine. Females aged 13-26 years who have not received the vaccine previously should obtain the 3-dose series. The vaccine is not recommended for use in pregnant females. However, pregnancy testing is not needed before receiving a dose. If a female is found to be pregnant after receiving a dose, no treatment is needed. In that case, the remaining doses should be delayed until after the pregnancy. Immunization is recommended for any person with an immunocompromised condition through the age of 61 years if she did not get any or all doses earlier. During the  3-dose series, the second dose should be obtained 4-8 weeks after the first dose. The third dose should be obtained 24 weeks after the first dose and 16 weeks after the second dose.  Zoster vaccine. One dose is recommended for adults aged 30 years or older unless certain conditions are present.  Measles, mumps, and rubella (MMR) vaccine. Adults born  before 1957 generally are considered immune to measles and mumps. Adults born in 1957 or later should have 1 or more doses of MMR vaccine unless there is a contraindication to the vaccine or there is laboratory evidence of immunity to each of the three diseases. A routine second dose of MMR vaccine should be obtained at least 28 days after the first dose for students attending postsecondary schools, health care workers, or international travelers. People who received inactivated measles vaccine or an unknown type of measles vaccine during 1963-1967 should receive 2 doses of MMR vaccine. People who received inactivated mumps vaccine or an unknown type of mumps vaccine before 1979 and are at high risk for mumps infection should consider immunization with 2 doses of MMR vaccine. For females of childbearing age, rubella immunity should be determined. If there is no evidence of immunity, females who are not pregnant should be vaccinated. If there is no evidence of immunity, females who are pregnant should delay immunization until after pregnancy. Unvaccinated health care workers born before 1957 who lack laboratory evidence of measles, mumps, or rubella immunity or laboratory confirmation of disease should consider measles and mumps immunization with 2 doses of MMR vaccine or rubella immunization with 1 dose of MMR vaccine.  Pneumococcal 13-valent conjugate (PCV13) vaccine. When indicated, a person who is uncertain of his immunization history and has no record of immunization should receive the PCV13 vaccine. All adults 65 years of age and older should receive this  vaccine. An adult aged 19 years or older who has certain medical conditions and has not been previously immunized should receive 1 dose of PCV13 vaccine. This PCV13 should be followed with a dose of pneumococcal polysaccharide (PPSV23) vaccine. Adults who are at high risk for pneumococcal disease should obtain the PPSV23 vaccine at least 8 weeks after the dose of PCV13 vaccine. Adults older than 61 years of age who have normal immune system function should obtain the PPSV23 vaccine dose at least 1 year after the dose of PCV13 vaccine.  Pneumococcal polysaccharide (PPSV23) vaccine. When PCV13 is also indicated, PCV13 should be obtained first. All adults aged 65 years and older should be immunized. An adult younger than age 65 years who has certain medical conditions should be immunized. Any person who resides in a nursing home or long-term care facility should be immunized. An adult smoker should be immunized. People with an immunocompromised condition and certain other conditions should receive both PCV13 and PPSV23 vaccines. People with human immunodeficiency virus (HIV) infection should be immunized as soon as possible after diagnosis. Immunization during chemotherapy or radiation therapy should be avoided. Routine use of PPSV23 vaccine is not recommended for American Indians, [place] Natives, or people younger than 65 years unless there are medical conditions that require PPSV23 vaccine. When indicated, people who have unknown immunization and have no record of immunization should receive PPSV23 vaccine. One-time revaccination 5 years after the first dose of PPSV23 is recommended for people aged 19-64 years who have chronic kidney failure, nephrotic syndrome, asplenia, or immunocompromised conditions. People who received 1-2 doses of PPSV23 before age 65 years should receive another dose of PPSV23 vaccine at age 65 years or later if at least 5 years have passed since the previous dose. Doses of PPSV23 are not  needed for people immunized with PPSV23 at or after age 65 years.  Meningococcal vaccine. Adults with asplenia or persistent complement component deficiencies should receive 2 doses of quadrivalent meningococcal conjugate (MenACWY-D) vaccine. The doses should be obtained   at least 2 months apart. Microbiologists working with certain meningococcal bacteria, [place] recruits, people at risk during an outbreak, and people who travel to or live in countries with a high rate of meningitis should be immunized. A first-year college student up through age 34 years who is living in a residence hall should receive a dose if she did not receive a dose on or after her 16th birthday. Adults who have certain high-risk conditions should receive one or more doses of vaccine.  Hepatitis A vaccine. Adults who wish to be protected from this disease, have certain high-risk conditions, work with hepatitis A-infected animals, work in hepatitis A research labs, or travel to or work in countries with a high rate of hepatitis A should be immunized. Adults who were previously unvaccinated and who anticipate close contact with an international adoptee during the first 60 days after arrival in the [place] from a country with a high rate of hepatitis A should be immunized.  Hepatitis B vaccine. Adults who wish to be protected from this disease, have certain high-risk conditions, may be exposed to blood or other infectious body fluids, are household contacts or sex partners of hepatitis B positive people, are clients or workers in certain care facilities, or travel to or work in countries with a high rate of hepatitis B should be immunized.  Haemophilus influenzae type b (Hib) vaccine. A previously unvaccinated person with asplenia or sickle cell disease or having a scheduled splenectomy should receive 1 dose of Hib vaccine. Regardless of previous immunization, a recipient of a hematopoietic stem cell transplant should receive a  3-dose series 6-12 months after her successful transplant. Hib vaccine is not recommended for adults with HIV infection. Preventive Services / Frequency Ages 35 to 4 years  Blood pressure check.** / Every 3-5 years.  Lipid and cholesterol check.** / Every 5 years beginning at age 60.  Clinical breast exam.** / Every 3 years for women in their 71s and 10s.  BRCA-related cancer risk assessment.** / For women who have family members with a BRCA-related cancer (breast, ovarian, tubal, or peritoneal cancers).  Pap test.** / Every 2 years from ages 76 through 26. Every 3 years starting at age 61 through age 76 or 93 with a history of 3 consecutive normal Pap tests.  HPV screening.** / Every 3 years from ages 37 through ages 60 to 51 with a history of 3 consecutive normal Pap tests.  Hepatitis C blood test.** / For any individual with known risks for hepatitis C.  Skin self-exam. / Monthly.  Influenza vaccine. / Every year.  Tetanus, diphtheria, and acellular pertussis (Tdap, Td) vaccine.** / Consult your health care provider. Pregnant women should receive 1 dose of Tdap vaccine during each pregnancy. 1 dose of Td every 10 years.  Varicella vaccine.** / Consult your health care provider. Pregnant females who do not have evidence of immunity should receive the first dose after pregnancy.  HPV vaccine. / 3 doses over 6 months, if 93 and younger. The vaccine is not recommended for use in pregnant females. However, pregnancy testing is not needed before receiving a dose.  Measles, mumps, rubella (MMR) vaccine.** / You need at least 1 dose of MMR if you were born in 1957 or later. You may also need a 2nd dose. For females of childbearing age, rubella immunity should be determined. If there is no evidence of immunity, females who are not pregnant should be vaccinated. If there is no evidence of immunity, females who are  pregnant should delay immunization until after pregnancy.  Pneumococcal  13-valent conjugate (PCV13) vaccine.** / Consult your health care provider.  Pneumococcal polysaccharide (PPSV23) vaccine.** / 1 to 2 doses if you smoke cigarettes or if you have certain conditions.  Meningococcal vaccine.** / 1 dose if you are age 68 to 8 years and a Market researcher living in a residence hall, or have one of several medical conditions, you need to get vaccinated against meningococcal disease. You may also need additional booster doses.  Hepatitis A vaccine.** / Consult your health care provider.  Hepatitis B vaccine.** / Consult your health care provider.  Haemophilus influenzae type b (Hib) vaccine.** / Consult your health care provider. Ages 7 to 53 years  Blood pressure check.** / Every year.  Lipid and cholesterol check.** / Every 5 years beginning at age 25 years.  Lung cancer screening. / Every year if you are aged 11-80 years and have a 30-pack-year history of smoking and currently smoke or have quit within the past 15 years. Yearly screening is stopped once you have quit smoking for at least 15 years or develop a health problem that would prevent you from having lung cancer treatment.  Clinical breast exam.** / Every year after age 48 years.  BRCA-related cancer risk assessment.** / For women who have family members with a BRCA-related cancer (breast, ovarian, tubal, or peritoneal cancers).  Mammogram.** / Every year beginning at age 41 years and continuing for as long as you are in good health. Consult with your health care provider.  Pap test.** / Every 3 years starting at age 65 years through age 37 or 70 years with a history of 3 consecutive normal Pap tests.  HPV screening.** / Every 3 years from ages 72 years through ages 60 to 40 years with a history of 3 consecutive normal Pap tests.  Fecal occult blood test (FOBT) of stool. / Every year beginning at age 21 years and continuing until age 5 years. You may not need to do this test if you get  a colonoscopy every 10 years.  Flexible sigmoidoscopy or colonoscopy.** / Every 5 years for a flexible sigmoidoscopy or every 10 years for a colonoscopy beginning at age 35 years and continuing until age 48 years.  Hepatitis C blood test.** / For all people born from [birth year] through 1965 and any individual with known risks for hepatitis C.  Skin self-exam. / Monthly.  Influenza vaccine. / Every year.  Tetanus, diphtheria, and acellular pertussis (Tdap/Td) vaccine.** / Consult your health care provider. Pregnant women should receive 1 dose of Tdap vaccine during each pregnancy. 1 dose of Td every 10 years.  Varicella vaccine.** / Consult your health care provider. Pregnant females who do not have evidence of immunity should receive the first dose after pregnancy.  Zoster vaccine.** / 1 dose for adults aged 30 years or older.  Measles, mumps, rubella (MMR) vaccine.** / You need at least 1 dose of MMR if you were born in 1957 or later. You may also need a second dose. For females of childbearing age, rubella immunity should be determined. If there is no evidence of immunity, females who are not pregnant should be vaccinated. If there is no evidence of immunity, females who are pregnant should delay immunization until after pregnancy.  Pneumococcal 13-valent conjugate (PCV13) vaccine.** / Consult your health care provider.  Pneumococcal polysaccharide (PPSV23) vaccine.** / 1 to 2 doses if you smoke cigarettes or if you have certain conditions.  Meningococcal vaccine.** /  Consult your health care provider.  Hepatitis A vaccine.** / Consult your health care provider.  Hepatitis B vaccine.** / Consult your health care provider.  Haemophilus influenzae type b (Hib) vaccine.** / Consult your health care provider. Ages 64 years and over  Blood pressure check.** / Every year.  Lipid and cholesterol check.** / Every 5 years beginning at age 23 years.  Lung cancer screening. / Every year if you  are aged 16-80 years and have a 30-pack-year history of smoking and currently smoke or have quit within the past 15 years. Yearly screening is stopped once you have quit smoking for at least 15 years or develop a health problem that would prevent you from having lung cancer treatment.  Clinical breast exam.** / Every year after age 74 years.  BRCA-related cancer risk assessment.** / For women who have family members with a BRCA-related cancer (breast, ovarian, tubal, or peritoneal cancers).  Mammogram.** / Every year beginning at age 44 years and continuing for as long as you are in good health. Consult with your health care provider.  Pap test.** / Every 3 years starting at age 58 years through age 22 or 39 years with 3 consecutive normal Pap tests. Testing can be stopped between 65 and 70 years with 3 consecutive normal Pap tests and no abnormal Pap or HPV tests in the past 10 years.  HPV screening.** / Every 3 years from ages 64 years through ages 70 or 61 years with a history of 3 consecutive normal Pap tests. Testing can be stopped between 65 and 70 years with 3 consecutive normal Pap tests and no abnormal Pap or HPV tests in the past 10 years.  Fecal occult blood test (FOBT) of stool. / Every year beginning at age 40 years and continuing until age 27 years. You may not need to do this test if you get a colonoscopy every 10 years.  Flexible sigmoidoscopy or colonoscopy.** / Every 5 years for a flexible sigmoidoscopy or every 10 years for a colonoscopy beginning at age 7 years and continuing until age 32 years.  Hepatitis C blood test.** / For all people born from [birth year] through 1965 and any individual with known risks for hepatitis C.  Osteoporosis screening.** / A one-time screening for women ages 30 years and over and women at risk for fractures or osteoporosis.  Skin self-exam. / Monthly.  Influenza vaccine. / Every year.  Tetanus, diphtheria, and acellular pertussis (Tdap/Td)  vaccine.** / 1 dose of Td every 10 years.  Varicella vaccine.** / Consult your health care provider.  Zoster vaccine.** / 1 dose for adults aged 35 years or older.  Pneumococcal 13-valent conjugate (PCV13) vaccine.** / Consult your health care provider.  Pneumococcal polysaccharide (PPSV23) vaccine.** / 1 dose for all adults aged 46 years and older.  Meningococcal vaccine.** / Consult your health care provider.  Hepatitis A vaccine.** / Consult your health care provider.  Hepatitis B vaccine.** / Consult your health care provider.  Haemophilus influenzae type b (Hib) vaccine.** / Consult your health care provider. ** Family history and personal history of risk and conditions may change your health care provider's recommendations.   This information is not intended to replace advice given to you by your health care provider. Make sure you discuss any questions you have with your health care provider.   Document Released: 11/10/2001 Document Revised: 10/05/2014 Document Reviewed: 02/09/2011 Elsevier Interactive Patient Education [employer].

## 2015-12-31 NOTE — Progress Notes (Signed)
Pre visit review using our clinic review tool, if applicable. No additional management support is needed unless otherwise documented below in the visit note. 

## 2016-06-08 ENCOUNTER — Encounter: Payer: BLUE CROSS/BLUE SHIELD | Admitting: Family Medicine

## 2016-06-23 ENCOUNTER — Ambulatory Visit (INDEPENDENT_AMBULATORY_CARE_PROVIDER_SITE_OTHER): Payer: BLUE CROSS/BLUE SHIELD | Admitting: Family Medicine

## 2016-06-23 ENCOUNTER — Encounter: Payer: Self-pay | Admitting: Family Medicine

## 2016-06-23 VITALS — BP 132/81 | HR 69 | Temp 98.3°F | Resp 17 | Ht 65.0 in | Wt 150.4 lb

## 2016-06-23 DIAGNOSIS — Z23 Encounter for immunization: Secondary | ICD-10-CM

## 2016-06-23 DIAGNOSIS — I10 Essential (primary) hypertension: Secondary | ICD-10-CM | POA: Diagnosis not present

## 2016-06-23 DIAGNOSIS — E785 Hyperlipidemia, unspecified: Secondary | ICD-10-CM

## 2016-06-23 LAB — COMPREHENSIVE METABOLIC PANEL
ALBUMIN: 3.8 g/dL (ref 3.5–5.2)
ALT: 17 U/L (ref 0–35)
AST: 23 U/L (ref 0–37)
Alkaline Phosphatase: 51 U/L (ref 39–117)
BUN: 23 mg/dL (ref 6–23)
CHLORIDE: 105 meq/L (ref 96–112)
CO2: 30 meq/L (ref 19–32)
CREATININE: 0.62 mg/dL (ref 0.40–1.20)
Calcium: 8.7 mg/dL (ref 8.4–10.5)
GFR: 104.03 mL/min (ref >60.00)
GLUCOSE: 85 mg/dL (ref 70–99)
POTASSIUM: 4.3 meq/L (ref 3.5–5.1)
SODIUM: 140 meq/L (ref 135–145)
Total Bilirubin: 0.4 mg/dL (ref 0.2–1.2)
Total Protein: 6.8 g/dL (ref 6.0–8.3)

## 2016-06-23 LAB — LIPID PANEL
CHOL/HDL RATIO: 3
Cholesterol: 159 mg/dL (ref 0–200)
HDL: 56 mg/dL (ref >39.00)
LDL CALC: 81 mg/dL (ref 0–99)
NONHDL: 102.99
Triglycerides: 111 mg/dL (ref 0.0–149.0)
VLDL: 22.2 mg/dL (ref 0.0–40.0)

## 2016-06-23 NOTE — Progress Notes (Signed)
Pre visit review using our clinic review tool, if applicable. No additional management support is needed unless otherwise documented below in the visit note. 

## 2016-06-23 NOTE — Progress Notes (Signed)
Patient ID: Janet Knox, female    DOB: Feb 27, 1956  Age: 60 y.o. MRN: [id number]    Subjective:  Subjective  HPI [name] presents for f/u cholesterol and bp.  No complaints.    Review of Systems  Constitutional: Negative for appetite change, diaphoresis, fatigue and unexpected weight change.  Eyes: Negative for pain, redness and visual disturbance.  Respiratory: Negative for cough, chest tightness, shortness of breath and wheezing.   Cardiovascular: Negative for chest pain, palpitations and leg swelling.  Endocrine: Negative for cold intolerance, heat intolerance, polydipsia, polyphagia and polyuria.  Genitourinary: Negative for difficulty urinating, dysuria and frequency.  Neurological: Negative for dizziness, light-headedness, numbness and headaches.    History Past Medical History:  Diagnosis Date  . Anemia   . Hyperlipidemia   . Hypertension   . Skin cancer     She has a past surgical history that includes Abdominal hysterectomy; Knee surgery; and Eye surgery (Left).   Her family history includes Alzheimer's disease in her mother; Coronary artery disease in her paternal grandfather; Hyperlipidemia in her father and mother; Hypertension in her father and mother; Stroke in her paternal grandfather.She reports that she has never smoked. She has never used smokeless tobacco. She reports that she drinks about 0.6 oz of alcohol per week . She reports that she does not use drugs.  Current Outpatient Prescriptions on File Prior to Visit  Medication Sig Dispense Refill  . aspirin 81 MG tablet Take 81 mg by mouth daily.    Marland Kitchen atorvastatin (LIPITOR) 40 MG tablet Take 1 tablet (40 mg total) by mouth at bedtime. 90 tablet 3  . Cholecalciferol (VITAMIN D3) 5000 UNITS TABS Take 1 tablet by mouth daily.     . Coenzyme Q10 (COQ-10) 200 MG CAPS Take 1 capsule by mouth daily.    Marland Kitchen estradiol (ESTRACE) 2 MG tablet Take 1 tablet (2 mg total) by mouth daily. 90 tablet 3  . fenofibrate  (TRICOR) 48 MG tablet TAKE 1 TABLET DAILY. 90 tablet 3  . Ferrous Sulfate (HIGH POTENCY IRON) 134 MG TABS Take 1 tablet by mouth daily.    . metoprolol (LOPRESSOR) 50 MG tablet Take 1 tablet (50 mg total) by mouth 2 (two) times daily. 180 tablet 3  . Multiple Vitamin (MULTIVITAMIN) tablet Take 1 tablet by mouth daily.    . niacin 500 MG tablet Take 2 tablets (1,000 mg total) by mouth daily with breakfast. 180 tablet 3  . Omega-3 Fatty Acids (FISH OIL) 1200 MG CAPS Take 1 capsule by mouth daily.     . vitamin E 400 UNIT capsule Take 400 Units by mouth daily.     No current facility-administered medications on file prior to visit.      Objective:  Objective  Physical Exam  Constitutional: She is oriented to person, place, and time. She appears well-developed and well-nourished.  HENT:  Head: Normocephalic and atraumatic.  Eyes: Conjunctivae and EOM are normal.  Neck: Normal range of motion. Neck supple. No JVD present. Carotid bruit is not present. No thyromegaly present.  Cardiovascular: Normal rate, regular rhythm and normal heart sounds.   No murmur heard. Pulmonary/Chest: Effort normal and breath sounds normal. No respiratory distress. She has no wheezes. She has no rales. She exhibits no tenderness.  Musculoskeletal: She exhibits no edema.  Neurological: She is alert and oriented to person, place, and time.  Psychiatric: She has a normal mood and affect. Her behavior is normal. Judgment and thought content normal.  Nursing note  and vitals reviewed.  BP 132/81 (BP Location: Right Arm, Patient Position: Sitting, Cuff Size: Normal)   Pulse 69   Temp 98.3 F (36.8 C) (Oral)   Resp 17   Ht 5\' 5"  (1.651 m)   Wt 150 lb 6.4 oz (68.2 kg)   SpO2 98%   BMI 25.03 kg/m  Wt Readings from Last 3 Encounters:  06/23/16 150 lb 6.4 oz (68.2 kg)  12/31/15 152 lb 6.4 oz (69.1 kg)  07/26/15 150 lb 9.6 oz (68.3 kg)     Lab Results  Component Value Date   WBC 4.4 12/31/2015   HGB 12.0  12/31/2015   HCT 36.2 12/31/2015   PLT 280.0 12/31/2015   GLUCOSE 85 06/23/2016   CHOL 159 06/23/2016   TRIG 111.0 06/23/2016   HDL 56.00 06/23/2016   LDLDIRECT 117.5 01/27/2010   LDLCALC 81 06/23/2016   ALT 17 06/23/2016   AST 23 06/23/2016   NA 140 06/23/2016   K 4.3 06/23/2016   CL 105 06/23/2016   CREATININE 0.62 06/23/2016   BUN 23 06/23/2016   CO2 30 06/23/2016   TSH 3.13 12/31/2015   MICROALBUR 0.7 03/22/2013    US Breast Ltd Uni Left Inc Axilla  Result Date: 03/27/2015 CLINICAL DATA:  60 year old female with a palpable abnormality felt in her left breast by her physician. The order states the palpable abnormality is at the 3 o'clock location, however the patient states that the palpable abnormality was really at the 9 o'clock location. EXAM: DIGITAL DIAGNOSTIC LEFT MAMMOGRAM WITH 3D TOMOSYNTHESIS AND CAD LEFT BREAST ULTRASOUND COMPARISON:  Previous exams. ACR Breast Density Category c: The breast tissue is heterogeneously dense, which may obscure small masses. FINDINGS: No suspicious masses or calcifications are seen in the left breast. There are no mammographic findings of malignancy in the left breast. Mammographic images were processed with CAD. Physical examination of the outer and inner left breast does not reveal any palpable masses. Targeted ultrasound of the left breast was performed. No discrete masses or abnormalities are seen, only normal appearing fibroglandular tissue is visualized. The inner left breast as well as the outer left breast was scanned. IMPRESSION: 1. No mammographic or sonographic correlate for the palpable abnormality in the left breast. 2.  No mammographic evidence of malignancy in the left breast. RECOMMENDATION: 1. Recommend further management of the left breast palpable abnormality be based on clinical assessment. 2.  Screening mammogram in one year.(Code:SM-B-01Y) I have discussed the findings and recommendations with the patient. Results were also  provided in writing at the conclusion of the visit. If applicable, a reminder letter will be sent to the patient regarding the next appointment. BI-RADS CATEGORY  1: Negative. Electronically Signed   By: Everlean Alstrom M.D.   On: 03/20/2015 11:19   Mm Diag Breast Tomo Uni Left  Result Date: 03/20/2015 CLINICAL DATA:  60 year old female with a palpable abnormality felt in her left breast by her physician. The order states the palpable abnormality is at the 3 o'clock location, however the patient states that the palpable abnormality was really at the 9 o'clock location. EXAM: DIGITAL DIAGNOSTIC LEFT MAMMOGRAM WITH 3D TOMOSYNTHESIS AND CAD LEFT BREAST ULTRASOUND COMPARISON:  Previous exams. ACR Breast Density Category c: The breast tissue is heterogeneously dense, which may obscure small masses. FINDINGS: No suspicious masses or calcifications are seen in the left breast. There are no mammographic findings of malignancy in the left breast. Mammographic images were processed with CAD. Physical examination of the outer and inner  left breast does not reveal any palpable masses. Targeted ultrasound of the left breast was performed. No discrete masses or abnormalities are seen, only normal appearing fibroglandular tissue is visualized. The inner left breast as well as the outer left breast was scanned. IMPRESSION: 1. No mammographic or sonographic correlate for the palpable abnormality in the left breast. 2.  No mammographic evidence of malignancy in the left breast. RECOMMENDATION: 1. Recommend further management of the left breast palpable abnormality be based on clinical assessment. 2.  Screening mammogram in one year.(Code:SM-B-01Y) I have discussed the findings and recommendations with the patient. Results were also provided in writing at the conclusion of the visit. If applicable, a reminder letter will be sent to the patient regarding the next appointment. BI-RADS CATEGORY  1: Negative. Electronically Signed    By: Everlean Alstrom M.D.   On: 03/20/2015 11:19     Assessment & Plan:  Plan  I have discontinued Ms. Wyble's Travoprost (BAK Free). I am also having her maintain her vitamin E, multivitamin, aspirin, CoQ-10, Fish Oil, Vitamin D3, Ferrous Sulfate, atorvastatin, fenofibrate, estradiol, metoprolol, and niacin.  No orders of the defined types were placed in this encounter.   Problem List Items Addressed This Visit      Unprioritized   Essential hypertension    Stable con't meds      Relevant Orders   Comprehensive metabolic panel (Completed)   Lipid panel (Completed)   Hyperlipidemia - Primary    Check labs con't meds      Relevant Orders   Comprehensive metabolic panel (Completed)   Lipid panel (Completed)    Other Visit Diagnoses    Encounter for immunization       Relevant Orders   Flu Vaccine QUAD 36+ mos IM (Completed)      Follow-up: Return in about 6 months (around 12/21/2016) for annual exam, fasting.  Ann Held, DO

## 2016-06-23 NOTE — Patient Instructions (Signed)

## 2016-06-24 NOTE — Assessment & Plan Note (Signed)
Stable con't meds 

## 2016-06-24 NOTE — Assessment & Plan Note (Signed)
Check labs con't meds 

## 2016-08-26 ENCOUNTER — Encounter: Payer: Self-pay | Admitting: Family Medicine

## 2016-08-26 ENCOUNTER — Encounter (HOSPITAL_BASED_OUTPATIENT_CLINIC_OR_DEPARTMENT_OTHER): Payer: Self-pay | Admitting: *Deleted

## 2016-08-26 ENCOUNTER — Emergency Department (HOSPITAL_BASED_OUTPATIENT_CLINIC_OR_DEPARTMENT_OTHER)
Admission: EM | Admit: 2016-08-26 | Discharge: 2016-08-26 | Disposition: A | Discharge status: Home / Self Care | Payer: BLUE CROSS/BLUE SHIELD | Attending: Emergency Medicine | Admitting: Emergency Medicine

## 2016-08-26 ENCOUNTER — Ambulatory Visit (INDEPENDENT_AMBULATORY_CARE_PROVIDER_SITE_OTHER): Payer: BLUE CROSS/BLUE SHIELD | Admitting: Family Medicine

## 2016-08-26 VITALS — BP 140/72 | HR 70 | Temp 98.2°F | Ht 65.0 in | Wt 150.6 lb

## 2016-08-26 DIAGNOSIS — Z79899 Other long term (current) drug therapy: Secondary | ICD-10-CM | POA: Diagnosis not present

## 2016-08-26 DIAGNOSIS — R5383 Other fatigue: Secondary | ICD-10-CM

## 2016-08-26 DIAGNOSIS — I10 Essential (primary) hypertension: Secondary | ICD-10-CM | POA: Diagnosis not present

## 2016-08-26 DIAGNOSIS — R002 Palpitations: Secondary | ICD-10-CM | POA: Insufficient documentation

## 2016-08-26 DIAGNOSIS — Z7982 Long term (current) use of aspirin: Secondary | ICD-10-CM | POA: Diagnosis not present

## 2016-08-26 DIAGNOSIS — R0789 Other chest pain: Secondary | ICD-10-CM

## 2016-08-26 DIAGNOSIS — Z85828 Personal history of other malignant neoplasm of skin: Secondary | ICD-10-CM | POA: Diagnosis not present

## 2016-08-26 LAB — CBC WITH DIFFERENTIAL/PLATELET
Basophils Absolute: 0 10*3/uL (ref 0.0–0.1)
Basophils Relative: 0 %
Eosinophils Absolute: 0.1 10*3/uL (ref 0.0–0.7)
Eosinophils Relative: 1 %
HCT: 36.5 % (ref 36.0–46.0)
Hemoglobin: 12.3 g/dL (ref 12.0–15.0)
Lymphocytes Relative: 28 %
Lymphs Abs: 1.9 10*3/uL (ref 0.7–4.0)
MCH: 31.8 pg (ref 26.0–34.0)
MCHC: 33.7 g/dL (ref 30.0–36.0)
MCV: 94.3 fL (ref 78.0–100.0)
Monocytes Absolute: 0.6 10*3/uL (ref 0.1–1.0)
Monocytes Relative: 10 %
Neutro Abs: 4.1 10*3/uL (ref 1.7–7.7)
Neutrophils Relative %: 61 %
Platelets: 295 10*3/uL (ref 150–400)
RBC: 3.87 MIL/uL (ref 3.87–5.11)
RDW: 12.6 % (ref 11.5–15.5)
WBC: 6.7 10*3/uL (ref 4.0–10.5)

## 2016-08-26 LAB — BASIC METABOLIC PANEL
Anion gap: 10 (ref 5–15)
BUN: 19 mg/dL (ref 6–20)
CALCIUM: 9.5 mg/dL (ref 8.9–10.3)
CO2: 25 mmol/L (ref 22–32)
CREATININE: 0.93 mg/dL (ref 0.44–1.00)
Chloride: 103 mmol/L (ref 101–111)
GFR calc Af Amer: >60 mL/min (ref >60)
Glucose, Bld: 115 mg/dL — ABNORMAL HIGH (ref 65–99)
Potassium: 3.7 mmol/L (ref 3.5–5.1)
SODIUM: 138 mmol/L (ref 135–145)

## 2016-08-26 LAB — TROPONIN I

## 2016-08-26 NOTE — ED Notes (Signed)
ED Provider at bedside. 

## 2016-08-26 NOTE — ED Notes (Signed)
ED Provider at bedside discussing test results and plan of care. 

## 2016-08-26 NOTE — ED Triage Notes (Signed)
Pt sent down from PMD office for eval "palpataions and stress x 3 days

## 2016-08-26 NOTE — Progress Notes (Signed)
[place] [facility] [address] [place], [place] [address] [phone]  Date:  08/26/2016   Name:  Janet Knox   DOB:  Jun 01, 1956   MRN:  [id number]  PCP:  Ann Held, DO    Chief Complaint: Fatigue (c/o fatigue that started this past Saturday or Sunday. Sx's come and go and has improved some over the past few days. )   History of Present Illness:  Janet Knox is a 60 y.o. very pleasant female patient who presents with the following:  Today is Wednesday- on Saturday/ Sunday she "felt like there was something on my chest," she says that she did not have chest pain but rather felt a heavy and uncomfortable feeling.   She was feeling SOB She did a lot of house work over the weekend -"I may have just overdone it." At this point she does not have the chest discomfort but will feel like her "heart is racing off an on."   No fever or chills, no GI symptoms No rash.   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    She has high cholesterol and HTN, but has no history of CAD Never had a stress test  She has not exercises as much over the last 2 years due to having several operations on her eye. However when she does exercise she does not recall any exertional CP No immediate family with history of CAD Last cholesterol in September looked fine  Patient Active Problem List   Diagnosis Date Noted  . SKIN CANCER, HX OF 01/27/2010  . CELLULITIS AND ABSCESS OF UNSPECIFIED DIGIT 03/22/2009  . SINUSITIS - ACUTE-NOS 12/17/2008  . Hyperlipidemia 10/27/2006  . ANEMIA-NOS 10/27/2006  . Essential hypertension 10/27/2006  . INSOMNIA 10/27/2006    Past Medical History:  Diagnosis Date  . Anemia   . Hyperlipidemia   .  Hypertension   . Skin cancer     Past Surgical History:  Procedure Laterality Date  . ABDOMINAL HYSTERECTOMY    . EYE SURGERY Left    Torn Retina  . KNEE SURGERY      Social History  Substance Use Topics  . Smoking status: Never Smoker  . Smokeless tobacco: Never Used  . Alcohol use 0.6 oz/week    1 Glasses of wine per week    Family History  Problem Relation Age of Onset  . Alzheimer's disease Mother   . Hyperlipidemia Mother   . Hypertension Mother   . Coronary artery disease Paternal Grandfather   . Stroke Paternal Grandfather   . Hyperlipidemia Father   . Hypertension Father     No Known Allergies  Medication list has been reviewed and updated.  Current Outpatient Prescriptions on File Prior to Visit  Medication Sig Dispense Refill  . aspirin 81 MG tablet Take 81 mg by mouth daily.    Marland Kitchen atorvastatin (LIPITOR) 40 MG tablet Take 1 tablet (40 mg total) by mouth at bedtime. 90 tablet 3  . Cholecalciferol (VITAMIN D3) 5000 UNITS TABS Take 1 tablet by mouth daily.     . Coenzyme Q10 (COQ-10) 200 MG CAPS Take 1 capsule by mouth daily.    Marland Kitchen estradiol (ESTRACE) 2 MG tablet  Take 1 tablet (2 mg total) by mouth daily. 90 tablet 3  . fenofibrate (TRICOR) 48 MG tablet TAKE 1 TABLET DAILY. 90 tablet 3  . Ferrous Sulfate (HIGH POTENCY IRON) 134 MG TABS Take 1 tablet by mouth daily.    . metoprolol (LOPRESSOR) 50 MG tablet Take 1 tablet (50 mg total) by mouth 2 (two) times daily. 180 tablet 3  . Multiple Vitamin (MULTIVITAMIN) tablet Take 1 tablet by mouth daily.    . niacin 500 MG tablet Take 2 tablets (1,000 mg total) by mouth daily with breakfast. 180 tablet 3  . Omega-3 Fatty Acids (FISH OIL) 1200 MG CAPS Take 1 capsule by mouth daily.     . vitamin E 400 UNIT capsule Take 400 Units by mouth daily.     No current facility-administered medications on file prior to visit.     Review of Systems:  As per HPI- otherwise negative. No recent travel, no hemoptysis     Physical Examination: Vitals:   08/26/16 1641 08/26/16 1646  BP: (!) 160/81 140/72  Pulse: 67   Temp: 98.2 F (36.8 C)    Vitals:   08/26/16 1641  Weight: 150 lb 9.6 oz (68.3 kg)  Height: 5\' 5"  (1.651 m)   Body mass index is 25.06 kg/m. Ideal Body Weight: Weight in (lb) to have BMI = 25: 149.9  GEN: WDWN, NAD, Non-toxic, A & O x 3, looks well HEENT: Atraumatic, Normocephalic. Neck supple. No masses, No LAD.  Bilateral TM wnl, oropharynx normal.  PEERL,EOMI.   Ears and Nose: No external deformity. CV: RRR, No M/G/R. No JVD. No thrill. No extra heart sounds. PULM: CTA B, no wheezes, crackles, rhonchi. No retractions. No resp. distress. No accessory muscle use. ABD: S, NT, ND, +BS. No rebound. No HSM. EXTR: No c/c/e.  No calf pain or swelling NEURO Normal gait.  PSYCH: Normally interactive. Conversant. Not depressed or anxious appearing.  Calm demeanor.   EKG: I cannot see images of her past EKG, but tracing from today shows NSR, no ST elevation or depression Assessment and Plan: Fatigue, unspecified type - Plan: EKG 12-Lead  Chest tightness - Plan: EKG 12-Lead  Here today with complaint of not feeling like herself over the weekend, and a feeling of chest heaviness and heart racing. Currently improved but she still does not feel 100%.   Advised that this may certainly be due to a benign cause such as anxiety, but I cannot adequately rule out a heart or lung concern here in the office.  Advised her that the most conservative course would be to have her seen at the ER for further testing. She would like to do this and plans to proceed to the ER at the [place].  She does want to have dinner with her son who is in from out of town first- I advised her that this is likely ok as her sx are currently improved, but if any worsening she should go straight to the ER   Signed Lamar Blinks, MD

## 2016-08-26 NOTE — Progress Notes (Signed)
Pre visit review using our clinic review tool, if applicable. No additional management support is needed unless otherwise documented below in the visit note. 

## 2016-08-26 NOTE — ED Provider Notes (Signed)
Emergency Department Provider Note  By signing my name below, I, Dora Sims, attest that this documentation has been prepared under the direction and in the presence of physician practitioner, Margette Fast, MD. Electronically Signed: Dora Sims, Scribe. 08/26/2016. 8:48 PM.  I have reviewed the triage vital signs and the nursing notes.   HISTORY  Chief Complaint Palpitations   HPI Comments: Janet Knox is a 60 y.o. female with PMHx significant for HTN and HLD who presents to the Emergency Department complaining of sudden onset, intermittent, palpitations for the last few days. She states her symptoms began with a pressure-like sensation in her chest. She states she has felt "wired", tense, and stressed for the last few days and believes her symptoms could be a result of this. She notes her stressors are related to her job and the upcoming holidays. Pt reports she pressure-like sensation in her chest has improved but she is still experiencing intermittent palpitations. She denies using new medications recently or any alcohol use. She is not a smoker. No family h/o heart attack. She states she is going on vacation in two days and wanted to come get evaluated beforehand to make sure nothing serious is going on; she was seen by her PCP earlier today for the same and was advised to come here. She denies numbness, weakness, fever, chills, chest pain, or any other associated symptoms.   Past Medical History:  Diagnosis Date  . Anemia   . Hyperlipidemia   . Hypertension   . Skin cancer     Patient Active Problem List   Diagnosis Date Noted  . SKIN CANCER, HX OF 01/27/2010  . CELLULITIS AND ABSCESS OF UNSPECIFIED DIGIT 03/22/2009  . SINUSITIS - ACUTE-NOS 12/17/2008  . Hyperlipidemia 10/27/2006  . ANEMIA-NOS 10/27/2006  . Essential hypertension 10/27/2006  . INSOMNIA 10/27/2006    Past Surgical History:  Procedure Laterality Date  . ABDOMINAL HYSTERECTOMY    . EYE  SURGERY Left    Torn Retina  . KNEE SURGERY      Current Outpatient Rx  . Order #: [id number] Class: Historical Med  . Order #: [id number] Class: Normal  . Order #: [id number] Class: Historical Med  . Order #: [id number] Class: Historical Med  . Order #: [id number] Class: Normal  . Order #: [id number] Class: Normal  . Order #: [id number] Class: Historical Med  . Order #: [id number] Class: Normal  . Order #: [id number] Class: Historical Med  . Order #: [id number] Class: Normal  . Order #: [id number] Class: Historical Med  . Order #: [id number] Class: Historical Med    Allergies Patient has no known allergies.  Family History  Problem Relation Age of Onset  . Alzheimer's disease Mother   . Hyperlipidemia Mother   . Hypertension Mother   . Coronary artery disease Paternal Grandfather   . Stroke Paternal Grandfather   . Hyperlipidemia Father   . Hypertension Father     Social History Social History  Substance Use Topics  . Smoking status: Never Smoker  . Smokeless tobacco: Never Used  . Alcohol use 0.6 oz/week    1 Glasses of wine per week    Review of Systems  Constitutional: No fever/chills Eyes: No visual changes. ENT: No sore throat. Cardiovascular: Denies chest pain. Positive for palpitations. Respiratory: Denies shortness of breath. Gastrointestinal: No abdominal pain.  No nausea, no vomiting.  No diarrhea.  No constipation. Genitourinary: Negative for dysuria. Musculoskeletal: Negative for back pain. Skin: Negative for rash. Neurological: Negative for headaches, focal weakness or numbness.  10-point ROS otherwise negative.  ____________________________________________   PHYSICAL EXAM:  VITAL SIGNS: ED Triage Vitals  Enc Vitals Group     BP 08/26/16 1851 154/98     Pulse Rate 08/26/16 1851 87     Resp 08/26/16 2000 17     Temp 08/26/16 1851 98.2 F (36.8 C)     Temp Source 08/26/16 1851 Oral     SpO2 08/26/16 1851 99 %     Weight 08/26/16 1849 150 lb (68 kg)     Height  08/26/16 1849 5\' 5"  (1.651 m)   Constitutional: Alert and oriented. Well appearing and in no acute distress. Eyes: Conjunctivae are normal.  Head: Atraumatic. Nose: No congestion/rhinnorhea. Mouth/Throat: Mucous membranes are moist.  Oropharynx non-erythematous. Neck: No stridor.   Cardiovascular: Normal rate, regular rhythm. Good peripheral circulation. Grossly normal heart sounds.   Respiratory: Normal respiratory effort.  No retractions. Lungs CTAB. Gastrointestinal: Soft and nontender. No distention.  Musculoskeletal: No lower extremity tenderness nor edema. No gross deformities of extremities. Neurologic:  Normal speech and language. No gross focal neurologic deficits are appreciated.  Skin:  Skin is warm, dry and intact. No rash noted.   ____________________________________________   LABS (all labs ordered are listed, but only abnormal results are displayed)  Labs Reviewed  BASIC METABOLIC PANEL - Abnormal; Notable for the following:       Result Value   Glucose, Bld 115 (*)    All other components within normal limits  CBC WITH DIFFERENTIAL/PLATELET  TROPONIN I   ____________________________________________  EKG   EKG Interpretation  Date/Time:  Wednesday August 26 2016 18:52:44 EST Ventricular Rate:  73 PR Interval:  146 QRS Duration: 82 QT Interval:  400 QTC Calculation: 440 R Axis:   82 Text Interpretation:  Normal sinus rhythm Nonspecific ST and T wave abnormality Abnormal ECG No STEMI.  Confirmed by Leeam Cedrone MD, Lupita Rosales [phone]) on 08/26/2016 7:16:18 PM       ____________________________________________  RADIOLOGY  None ____________________________________________ DIAGNOSTIC STUDIES: Oxygen Saturation is 97% on RA, normal by my interpretation.    COORDINATION OF CARE: 8:56 PM Discussed treatment plan with pt at bedside and pt agreed to plan.   PROCEDURES  Procedure(s) performed:    Procedures  None ____________________________________________   INITIAL IMPRESSION / ASSESSMENT AND PLAN / ED COURSE  Pertinent labs & imaging results that were available during my care of the patient were reviewed by me and considered in my medical decision making (see chart for details).  Patient presents to the ED for evaluation of intermittent palpitations and 1-2 episodes of chest pressure 3 days ago. No chest pain or pressure since. No exacerbating or alleviating factors. Labs including troponin are normal. EKG non-specific. Discussed follow up with PCP and Cardiology for consideration of stress testing and possible ambulatory monitoring in the outpatient setting.   At this time, I do not feel there is any life-threatening condition present. I have reviewed and discussed all results (EKG, imaging, lab, urine as appropriate), exam findings with patient. I have reviewed nursing notes and appropriate previous records.  I feel the patient is safe to be discharged home without further emergent workup. Discussed usual and customary return precautions. Patient and family (if present) verbalize understanding and are comfortable with this plan.  Patient will follow-up with their primary care provider. If they do not have a primary care provider, information for follow-up has been provided to them. All questions have been answered.  ____________________________________________  FINAL CLINICAL IMPRESSION(S) / ED DIAGNOSES  Final diagnoses:  Palpitations  MEDICATIONS GIVEN DURING THIS VISIT:  None  NEW OUTPATIENT MEDICATIONS STARTED DURING THIS VISIT:  None   Note:  This document was prepared using Dragon voice recognition software and may include unintentional dictation errors.  Nanda Quinton, MD Emergency Medicine  I personally performed the services described in this documentation, which was scribed in my presence. The recorded information has been reviewed and is accurate.    Margette Fast, MD 08/27/16 [phone]

## 2016-08-26 NOTE — Discharge Instructions (Signed)

## 2016-12-14 ENCOUNTER — Other Ambulatory Visit: Payer: Self-pay | Admitting: Family Medicine

## 2016-12-14 DIAGNOSIS — I10 Essential (primary) hypertension: Secondary | ICD-10-CM

## 2016-12-14 DIAGNOSIS — E785 Hyperlipidemia, unspecified: Secondary | ICD-10-CM

## 2017-01-05 ENCOUNTER — Encounter: Payer: Self-pay | Admitting: Family Medicine

## 2017-01-05 ENCOUNTER — Ambulatory Visit (INDEPENDENT_AMBULATORY_CARE_PROVIDER_SITE_OTHER): Payer: BLUE CROSS/BLUE SHIELD | Admitting: Family Medicine

## 2017-01-05 VITALS — BP 128/80 | HR 62 | Temp 98.0°F | Resp 16 | Ht 64.5 in | Wt 148.2 lb

## 2017-01-05 DIAGNOSIS — Z780 Asymptomatic menopausal state: Secondary | ICD-10-CM | POA: Diagnosis not present

## 2017-01-05 DIAGNOSIS — I10 Essential (primary) hypertension: Secondary | ICD-10-CM | POA: Diagnosis not present

## 2017-01-05 DIAGNOSIS — Z0000 Encounter for general adult medical examination without abnormal findings: Secondary | ICD-10-CM | POA: Diagnosis not present

## 2017-01-05 DIAGNOSIS — Z23 Encounter for immunization: Secondary | ICD-10-CM

## 2017-01-05 DIAGNOSIS — E785 Hyperlipidemia, unspecified: Secondary | ICD-10-CM

## 2017-01-05 LAB — LIPID PANEL
CHOL/HDL RATIO: 3
Cholesterol: 172 mg/dL (ref 0–200)
HDL: 67.1 mg/dL (ref >39.00)
LDL CALC: 89 mg/dL (ref 0–99)
NonHDL: 105.02
Triglycerides: 78 mg/dL (ref 0.0–149.0)
VLDL: 15.6 mg/dL (ref 0.0–40.0)

## 2017-01-05 LAB — CBC WITH DIFFERENTIAL/PLATELET
BASOS PCT: 0.7 % (ref 0.0–3.0)
Basophils Absolute: 0 10*3/uL (ref 0.0–0.1)
EOS PCT: 1.9 % (ref 0.0–5.0)
Eosinophils Absolute: 0.1 10*3/uL (ref 0.0–0.7)
HCT: 36 % (ref 36.0–46.0)
Hemoglobin: 11.9 g/dL — ABNORMAL LOW (ref 12.0–15.0)
LYMPHS ABS: 1.4 10*3/uL (ref 0.7–4.0)
Lymphocytes Relative: 31.7 % (ref 12.0–46.0)
MCHC: 33.1 g/dL (ref 30.0–36.0)
MCV: 95.4 fl (ref 78.0–100.0)
MONOS PCT: 8.1 % (ref 3.0–12.0)
Monocytes Absolute: 0.4 10*3/uL (ref 0.1–1.0)
NEUTROS ABS: 2.6 10*3/uL (ref 1.4–7.7)
NEUTROS PCT: 57.6 % (ref 43.0–77.0)
PLATELETS: 268 10*3/uL (ref 150.0–400.0)
RBC: 3.78 Mil/uL — AB (ref 3.87–5.11)
RDW: 13.8 % (ref 11.5–15.5)
WBC: 4.5 10*3/uL (ref 4.0–10.5)

## 2017-01-05 LAB — POC URINALSYSI DIPSTICK (AUTOMATED)
BILIRUBIN UA: NEGATIVE
Blood, UA: NEGATIVE
GLUCOSE UA: NEGATIVE
KETONES UA: NEGATIVE
LEUKOCYTES UA: NEGATIVE
Nitrite, UA: NEGATIVE
Protein, UA: NEGATIVE
Spec Grav, UA: 1.025 (ref 1.030–1.035)
Urobilinogen, UA: 0.2 (ref ?–2.0)
pH, UA: 6 (ref 5.0–8.0)

## 2017-01-05 LAB — COMPREHENSIVE METABOLIC PANEL
ALT: 14 U/L (ref 0–35)
AST: 20 U/L (ref 0–37)
Albumin: 4.1 g/dL (ref 3.5–5.2)
Alkaline Phosphatase: 51 U/L (ref 39–117)
BUN: 22 mg/dL (ref 6–23)
CHLORIDE: 106 meq/L (ref 96–112)
CO2: 27 meq/L (ref 19–32)
CREATININE: 0.65 mg/dL (ref 0.40–1.20)
Calcium: 8.8 mg/dL (ref 8.4–10.5)
GFR: 98.34 mL/min (ref >60.00)
Glucose, Bld: 88 mg/dL (ref 70–99)
POTASSIUM: 3.5 meq/L (ref 3.5–5.1)
SODIUM: 140 meq/L (ref 135–145)
Total Bilirubin: 0.4 mg/dL (ref 0.2–1.2)
Total Protein: 6.8 g/dL (ref 6.0–8.3)

## 2017-01-05 LAB — TSH: TSH: 2.8 u[IU]/mL (ref 0.35–4.50)

## 2017-01-05 MED ORDER — ESTRADIOL 2 MG PO TABS: 2.0000 mg | ORAL_TABLET | Freq: Every day | ORAL | 1 refills | Status: DC

## 2017-01-05 MED ORDER — ATORVASTATIN CALCIUM 40 MG PO TABS: 40.0000 mg | ORAL_TABLET | Freq: Every day | ORAL | 1 refills | Status: DC

## 2017-01-05 MED ORDER — FENOFIBRATE 48 MG PO TABS: ORAL_TABLET | ORAL | 1 refills | Status: DC

## 2017-01-05 MED ORDER — METOPROLOL TARTRATE 50 MG PO TABS: 50.0000 mg | ORAL_TABLET | Freq: Two times a day (BID) | ORAL | 1 refills | Status: DC

## 2017-01-05 NOTE — Patient Instructions (Signed)

## 2017-01-05 NOTE — Progress Notes (Signed)
Pre visit review using our clinic review tool, if applicable. No additional management support is needed unless otherwise documented below in the visit note. 

## 2017-01-05 NOTE — Assessment & Plan Note (Signed)
ghm utd Check labs See AVS shingrix given  rto 2-6 m for #2

## 2017-01-05 NOTE — Assessment & Plan Note (Signed)
Tolerating statin, encouraged heart healthy diet, avoid trans fats, minimize simple carbs and saturated fats. Increase exercise as tolerated 

## 2017-01-05 NOTE — Progress Notes (Signed)
Patient ID: Janet Knox, female   DOB: 1955/10/25, 61 y.o.   MRN: [id number]     Subjective:     Janet Knox is a 61 y.o. female and is here for a comprehensive physical exam. The patient reports no problems.  She building a new home and currently packing and moving things.  Has some trouble sleeping and would like to try melantonin.  She would like an EKG.    Social History   Social History  . Marital status: Married    Spouse name: N/A  . Number of children: N/A  . Years of education: N/A   Occupational History  . [employer]    Social History Main Topics  . Smoking status: Never Smoker  . Smokeless tobacco: Never Used  . Alcohol use 0.6 oz/week    1 Glasses of wine per week  . Drug use: No  . Sexual activity: Yes    Partners: Male   Other Topics Concern  . Not on file   Social History Narrative   Exercise-- walks 4 miles 3 x a week   Health Maintenance  Topic Date Due  . MAMMOGRAM  03/19/2017  . DEXA SCAN  03/19/2017  . INFLUENZA VACCINE  04/28/2017  . PAP SMEAR  03/19/2018  . TETANUS/TDAP  03/23/2023  . COLONOSCOPY  09/24/2025  . Hepatitis C Screening  Completed  . HIV Screening  Completed    The following portions of the patient's history were reviewed and updated as appropriate:  She  has a past medical history of Anemia; Hyperlipidemia; Hypertension; and Skin cancer. She  does not have any pertinent problems on file. She  has a past surgical history that includes Abdominal hysterectomy; Knee surgery; and Eye surgery (Left). Her family history includes Alzheimer's disease in her mother; Coronary artery disease in her paternal grandfather; Hyperlipidemia in her father and mother; Hypertension in her father and mother; Stroke in her paternal grandfather. She  reports that she has never smoked. She has never used smokeless tobacco. She reports that she drinks about 0.6 oz of alcohol per week . She reports that she does not use drugs. She has a current  medication list which includes the following prescription(s): aspirin, atorvastatin, vitamin d3, coq-10, estradiol, fenofibrate, ferrous sulfate, metoprolol, multivitamin, niacin, fish oil, and vitamin e. Current Outpatient Prescriptions on File Prior to Visit  Medication Sig Dispense Refill  . aspirin 81 MG tablet Take 81 mg by mouth daily.    . Cholecalciferol (VITAMIN D3) 5000 UNITS TABS Take 1 tablet by mouth daily.     . Coenzyme Q10 (COQ-10) 200 MG CAPS Take 1 capsule by mouth daily.    . Multiple Vitamin (MULTIVITAMIN) tablet Take 1 tablet by mouth daily.    . niacin 500 MG tablet Take 2 tablets (1,000 mg total) by mouth daily with breakfast. 180 tablet 3  . Omega-3 Fatty Acids (FISH OIL) 1200 MG CAPS Take 1 capsule by mouth daily.     . vitamin E 400 UNIT capsule Take 400 Units by mouth daily.     No current facility-administered medications on file prior to visit.    She has No Known Allergies..  Review of Systems Review of Systems  Constitutional: Negative for activity change, appetite change and fatigue.  HENT: Negative for hearing loss, congestion, tinnitus and ear discharge.  dentist q65m Eyes: Negative for visual disturbance (see optho q1y -- vision corrected to 20/20 with glasses).  Respiratory: Negative for cough, chest tightness and shortness  of breath.   Cardiovascular: Negative for chest pain, palpitations and leg swelling.  Gastrointestinal: Negative for abdominal pain, diarrhea, constipation and abdominal distention.  Genitourinary: Negative for urgency, frequency, decreased urine volume and difficulty urinating.  Musculoskeletal: Negative for back pain, arthralgias and gait problem.  Skin: Negative for color change, pallor and rash.  Neurological: Negative for dizziness, light-headedness, numbness and headaches.  Hematological: Negative for adenopathy. Does not bruise/bleed easily.  Psychiatric/Behavioral: Negative for suicidal ideas, confusion, sleep disturbance,  self-injury, dysphoric mood, decreased concentration and agitation.       Objective:    BP 128/80 (BP Location: Left Arm, Cuff Size: Normal)   Pulse 62   Temp 98 F (36.7 C) (Oral)   Resp 16   Ht 5' 4.5" (1.638 m)   Wt 148 lb 3.2 oz (67.2 kg)   SpO2 97%   BMI 25.05 kg/m  General appearance: alert, cooperative, appears stated age and no distress Head: Normocephalic, without obvious abnormality, atraumatic Eyes: conjunctivae/corneas clear. PERRL, EOM's intact. Fundi benign. Ears: normal TM's and external ear canals both ears Nose: Nares normal. Septum midline. Mucosa normal. No drainage or sinus tenderness. Throat: lips, mucosa, and tongue normal; teeth and gums normal Neck: no adenopathy, no carotid bruit, no JVD, supple, symmetrical, trachea midline and thyroid not enlarged, symmetric, no tenderness/mass/nodules Back: symmetric, no curvature. ROM normal. No CVA tenderness. Lungs: clear to auscultation bilaterally Breasts: normal appearance, no masses or tenderness, gyn Heart: regular rate and rhythm, S1, S2 normal, no murmur, click, rub or gallop Abdomen: soft, non-tender; bowel sounds normal; no masses,  no organomegaly Pelvic: deferred Extremities: gyn Pulses: 2+ and symmetric Skin: Skin color, texture, turgor normal. No rashes or lesions Lymph nodes: Cervical, supraclavicular, and axillary nodes normal. Neurologic: Alert and oriented X 3, normal strength and tone. Normal symmetric reflexes. Normal coordination and gait    ekg-- NSR  Assessment:    Healthy female exam.      Plan:    ghm utd Check labs  See After Visit Summary for Counseling Recommendations    1. Hyperlipidemia, unspecified hyperlipidemia type   - atorvastatin (LIPITOR) 40 MG tablet; Take 1 tablet (40 mg total) by mouth at bedtime.  Dispense: 90 tablet; Refill: 1 - fenofibrate (TRICOR) 48 MG tablet; TAKE 1 TABLET DAILY.  Dispense: 90 tablet; Refill: 1 - POCT Urinalysis Dipstick (Automated) - CBC  with Differential/Platelet - Lipid panel - TSH  2. Essential hypertension   - metoprolol (LOPRESSOR) 50 MG tablet; Take 1 tablet (50 mg total) by mouth 2 (two) times daily.  Dispense: 180 tablet; Refill: 1 - POCT Urinalysis Dipstick (Automated) - CBC with Differential/Platelet - Comprehensive metabolic panel - TSH - EKG 12-Lead  3. Menopause   - estradiol (ESTRACE) 2 MG tablet; Take 1 tablet (2 mg total) by mouth daily.  Dispense: 90 tablet; Refill: 1  4. Preventative health care See above - POCT Urinalysis Dipstick (Automated) - CBC with Differential/Platelet - TSH  5. Need for shingles vaccine   - Varicella-zoster vaccine IM (Shingrix)

## 2017-01-05 NOTE — Assessment & Plan Note (Signed)
Well controlled, no changes to meds. Encouraged heart healthy diet such as the DASH diet and exercise as tolerated.  °

## 2017-01-07 ENCOUNTER — Other Ambulatory Visit: Payer: Self-pay | Admitting: Family Medicine

## 2017-01-07 DIAGNOSIS — D649 Anemia, unspecified: Secondary | ICD-10-CM

## 2017-01-07 DIAGNOSIS — E785 Hyperlipidemia, unspecified: Secondary | ICD-10-CM

## 2017-03-16 ENCOUNTER — Ambulatory Visit (INDEPENDENT_AMBULATORY_CARE_PROVIDER_SITE_OTHER): Payer: BLUE CROSS/BLUE SHIELD | Admitting: Family Medicine

## 2017-03-16 ENCOUNTER — Encounter: Payer: Self-pay | Admitting: Family Medicine

## 2017-03-16 VITALS — BP 126/82 | HR 61 | Temp 97.6°F | Resp 16 | Ht 65.0 in | Wt 150.6 lb

## 2017-03-16 DIAGNOSIS — L089 Local infection of the skin and subcutaneous tissue, unspecified: Secondary | ICD-10-CM | POA: Diagnosis not present

## 2017-03-16 MED ORDER — MUPIROCIN CALCIUM 2 % EX CREA: 1.0000 "application " | TOPICAL_CREAM | Freq: Two times a day (BID) | CUTANEOUS | 0 refills | Status: DC

## 2017-03-16 NOTE — Patient Instructions (Signed)

## 2017-03-16 NOTE — Progress Notes (Signed)
Patient ID: Janet Knox, female    DOB: [birth year]  Age: 61 y.o. MRN: [id number]    Subjective:  Subjective  HPI Janet Knox presents for sore on low back x 1 week.  Unsure if its an insect/ tick bite ---- she is putting neosporin on it   Review of Systems  Constitutional: Negative for appetite change, diaphoresis, fatigue and unexpected weight change.  Eyes: Negative for pain, redness and visual disturbance.  Respiratory: Negative for cough, chest tightness, shortness of breath and wheezing.   Cardiovascular: Negative for chest pain, palpitations and leg swelling.  Endocrine: Negative for cold intolerance, heat intolerance, polydipsia, polyphagia and polyuria.  Genitourinary: Negative for difficulty urinating, dysuria and frequency.  Neurological: Negative for dizziness, light-headedness, numbness and headaches.    History Past Medical History:  Diagnosis Date  . Anemia   . Hyperlipidemia   . Hypertension   . Skin cancer     She has a past surgical history that includes Abdominal hysterectomy; Knee surgery; and Eye surgery (Left).   Her family history includes Alzheimer's disease in her mother; Coronary artery disease in her paternal grandfather; Hyperlipidemia in her father and mother; Hypertension in her father and mother; Stroke in her paternal grandfather.She reports that she has never smoked. She has never used smokeless tobacco. She reports that she drinks about 0.6 oz of alcohol per week . She reports that she does not use drugs.  Current Outpatient Prescriptions on File Prior to Visit  Medication Sig Dispense Refill  . aspirin 81 MG tablet Take 81 mg by mouth daily.    Marland Kitchen atorvastatin (LIPITOR) 40 MG tablet Take 1 tablet (40 mg total) by mouth at bedtime. 90 tablet 1  . Cholecalciferol (VITAMIN D3) 5000 UNITS TABS Take 1 tablet by mouth daily.     . Coenzyme Q10 (COQ-10) 200 MG CAPS Take 1 capsule by mouth daily.    Marland Kitchen estradiol (ESTRACE) 2 MG tablet Take 1 tablet (2  mg total) by mouth daily. 90 tablet 1  . fenofibrate (TRICOR) 48 MG tablet TAKE 1 TABLET DAILY. 90 tablet 1  . Ferrous Sulfate 27 MG TABS Take 1 tablet by mouth daily.    . metoprolol (LOPRESSOR) 50 MG tablet Take 1 tablet (50 mg total) by mouth 2 (two) times daily. 180 tablet 1  . Multiple Vitamin (MULTIVITAMIN) tablet Take 1 tablet by mouth daily.    . niacin 500 MG tablet Take 2 tablets (1,000 mg total) by mouth daily with breakfast. 180 tablet 3  . Omega-3 Fatty Acids (FISH OIL) 1200 MG CAPS Take 1 capsule by mouth daily.     . vitamin E 400 UNIT capsule Take 400 Units by mouth daily.     No current facility-administered medications on file prior to visit.      Objective:  Objective  Physical Exam  Constitutional: She is oriented to person, place, and time. She appears well-developed and well-nourished.  HENT:  Head: Normocephalic and atraumatic.  Eyes: Conjunctivae and EOM are normal.  Neck: Normal range of motion. Neck supple. No JVD present. Carotid bruit is not present. No thyromegaly present.  Cardiovascular: Normal rate, regular rhythm and normal heart sounds.   No murmur heard. Pulmonary/Chest: Effort normal and breath sounds normal. No respiratory distress. She has no wheezes. She has no rales. She exhibits no tenderness.  Musculoskeletal: She exhibits no edema.  Neurological: She is alert and oriented to person, place, and time.  Skin: No rash noted. No erythema.  Psychiatric: She has a normal mood and affect.  Nursing note and vitals reviewed.  BP 126/82 (BP Location: Left Arm, Patient Position: Sitting, Cuff Size: Normal)   Pulse 61   Temp 97.6 F (36.4 C) (Oral)   Resp 16   Ht 5\' 5"  (1.651 m)   Wt 150 lb 9.6 oz (68.3 kg)   SpO2 98%   BMI 25.06 kg/m  Wt Readings from Last 3 Encounters:  03/16/17 150 lb 9.6 oz (68.3 kg)  01/05/17 148 lb 3.2 oz (67.2 kg)  08/26/16 150 lb (68 kg)     Lab Results  Component Value Date   WBC 4.5 01/05/2017   HGB 11.9  (L) 01/05/2017   HCT 36.0 01/05/2017   PLT 268.0 01/05/2017   GLUCOSE 88 01/05/2017   CHOL 172 01/05/2017   TRIG 78.0 01/05/2017   HDL 67.10 01/05/2017   LDLDIRECT 117.5 01/27/2010   LDLCALC 89 01/05/2017   ALT 14 01/05/2017   AST 20 01/05/2017   NA 140 01/05/2017   K 3.5 01/05/2017   CL 106 01/05/2017   CREATININE 0.65 01/05/2017   BUN 22 01/05/2017   CO2 27 01/05/2017   TSH 2.80 01/05/2017   MICROALBUR 0.7 03/22/2013    No results found.   Assessment & Plan:  Plan  I am having Janet Knox start on mupirocin cream. I am also having her maintain her vitamin E, multivitamin, aspirin, CoQ-10, Fish Oil, Vitamin D3, niacin, Ferrous Sulfate, atorvastatin, estradiol, fenofibrate, and metoprolol tartrate.  Meds ordered this encounter  Medications  . mupirocin cream (BACTROBAN) 2 %    Sig: Apply 1 application topically 2 (two) times daily.    Dispense:  15 g    Refill:  0    Problem List Items Addressed This Visit    None    Visit Diagnoses    Skin infection    -  Primary   Relevant Medications   mupirocin cream (BACTROBAN) 2 %    rto if symptoms worsen/ change Follow-up: Return if symptoms worsen or fail to improve.  Ann Held, DO

## 2017-07-01 ENCOUNTER — Ambulatory Visit (INDEPENDENT_AMBULATORY_CARE_PROVIDER_SITE_OTHER): Payer: BLUE CROSS/BLUE SHIELD

## 2017-07-01 ENCOUNTER — Other Ambulatory Visit (INDEPENDENT_AMBULATORY_CARE_PROVIDER_SITE_OTHER): Payer: BLUE CROSS/BLUE SHIELD

## 2017-07-01 DIAGNOSIS — E785 Hyperlipidemia, unspecified: Secondary | ICD-10-CM | POA: Diagnosis not present

## 2017-07-01 DIAGNOSIS — Z23 Encounter for immunization: Secondary | ICD-10-CM

## 2017-07-01 DIAGNOSIS — D649 Anemia, unspecified: Secondary | ICD-10-CM | POA: Diagnosis not present

## 2017-07-01 LAB — LIPID PANEL
CHOLESTEROL: 181 mg/dL (ref 0–200)
HDL: 70.2 mg/dL (ref >39.00)
LDL CALC: 86 mg/dL (ref 0–99)
NonHDL: 111.15
TRIGLYCERIDES: 125 mg/dL (ref 0.0–149.0)
Total CHOL/HDL Ratio: 3
VLDL: 25 mg/dL (ref 0.0–40.0)

## 2017-07-01 LAB — COMPREHENSIVE METABOLIC PANEL
ALBUMIN: 4 g/dL (ref 3.5–5.2)
ALT: 16 U/L (ref 0–35)
AST: 22 U/L (ref 0–37)
Alkaline Phosphatase: 54 U/L (ref 39–117)
BILIRUBIN TOTAL: 0.5 mg/dL (ref 0.2–1.2)
BUN: 18 mg/dL (ref 6–23)
CHLORIDE: 104 meq/L (ref 96–112)
CO2: 27 mEq/L (ref 19–32)
CREATININE: 0.69 mg/dL (ref 0.40–1.20)
Calcium: 9.2 mg/dL (ref 8.4–10.5)
GFR: 91.64 mL/min (ref >60.00)
Glucose, Bld: 84 mg/dL (ref 70–99)
Potassium: 3.9 mEq/L (ref 3.5–5.1)
SODIUM: 139 meq/L (ref 135–145)
TOTAL PROTEIN: 6.9 g/dL (ref 6.0–8.3)

## 2017-07-01 LAB — CBC WITH DIFFERENTIAL/PLATELET
BASOS ABS: 0 10*3/uL (ref 0.0–0.1)
BASOS PCT: 1 % (ref 0.0–3.0)
EOS ABS: 0.1 10*3/uL (ref 0.0–0.7)
Eosinophils Relative: 2.7 % (ref 0.0–5.0)
HCT: 36.3 % (ref 36.0–46.0)
HEMOGLOBIN: 12.1 g/dL (ref 12.0–15.0)
Lymphocytes Relative: 28.7 % (ref 12.0–46.0)
Lymphs Abs: 1.3 10*3/uL (ref 0.7–4.0)
MCHC: 33.4 g/dL (ref 30.0–36.0)
MCV: 95.6 fl (ref 78.0–100.0)
MONO ABS: 0.4 10*3/uL (ref 0.1–1.0)
Monocytes Relative: 8.1 % (ref 3.0–12.0)
NEUTROS ABS: 2.7 10*3/uL (ref 1.4–7.7)
Neutrophils Relative %: 59.5 % (ref 43.0–77.0)
PLATELETS: 268 10*3/uL (ref 150.0–400.0)
RBC: 3.8 Mil/uL — ABNORMAL LOW (ref 3.87–5.11)
RDW: 13.3 % (ref 11.5–15.5)
WBC: 4.6 10*3/uL (ref 4.0–10.5)

## 2017-07-01 NOTE — Progress Notes (Signed)
Pre visit review using our clinic tool,if applicable. No additional management support is needed unless otherwise documented below in the visit note.   Patient in for 2nd dose of Shingrix per order from Dr. Carollee Herter.  Patient states she did have a fever for a few days which according to side effects listed on immunization sheet is a normal reaction. States it only lasted a couple days.   Patient stated she would like to take Flu shot as well.   Advised to wait until she returned from week trip since she had reaction to first Shingrix immunization.  Scheduled for 07/15/17.  Given 0.5 ml Left arm. Patient tolerated well

## 2017-07-08 ENCOUNTER — Ambulatory Visit: Payer: BLUE CROSS/BLUE SHIELD

## 2017-07-08 ENCOUNTER — Other Ambulatory Visit: Payer: BLUE CROSS/BLUE SHIELD

## 2017-07-15 ENCOUNTER — Ambulatory Visit (INDEPENDENT_AMBULATORY_CARE_PROVIDER_SITE_OTHER): Payer: BLUE CROSS/BLUE SHIELD | Admitting: Behavioral Health

## 2017-07-15 DIAGNOSIS — Z23 Encounter for immunization: Secondary | ICD-10-CM | POA: Diagnosis not present

## 2017-07-15 NOTE — Progress Notes (Signed)
Pre visit review using our clinic review tool, if applicable. No additional management support is needed unless otherwise documented below in the visit note.  Patient came in clinic today for influenza vaccination. IM injection was given in the left deltoid. Patient tolerated the injection well.

## 2017-08-11 ENCOUNTER — Other Ambulatory Visit: Payer: Self-pay | Admitting: Family Medicine

## 2017-08-11 DIAGNOSIS — I10 Essential (primary) hypertension: Secondary | ICD-10-CM

## 2017-08-11 DIAGNOSIS — E785 Hyperlipidemia, unspecified: Secondary | ICD-10-CM

## 2017-08-11 DIAGNOSIS — Z780 Asymptomatic menopausal state: Secondary | ICD-10-CM

## 2017-11-08 ENCOUNTER — Other Ambulatory Visit: Payer: Self-pay | Admitting: Family Medicine

## 2017-11-08 ENCOUNTER — Telehealth: Payer: Self-pay | Admitting: Family Medicine

## 2017-11-08 DIAGNOSIS — Z780 Asymptomatic menopausal state: Secondary | ICD-10-CM

## 2017-11-08 DIAGNOSIS — E785 Hyperlipidemia, unspecified: Secondary | ICD-10-CM

## 2017-11-08 DIAGNOSIS — I10 Essential (primary) hypertension: Secondary | ICD-10-CM

## 2017-11-08 MED ORDER — ATORVASTATIN CALCIUM 40 MG PO TABS: ORAL_TABLET | ORAL | 0 refills | Status: DC

## 2017-11-08 MED ORDER — METOPROLOL TARTRATE 50 MG PO TABS: ORAL_TABLET | ORAL | 0 refills | Status: DC

## 2017-11-08 MED ORDER — ESTRADIOL 2 MG PO TABS: ORAL_TABLET | ORAL | 0 refills | Status: DC

## 2017-11-08 NOTE — Telephone Encounter (Signed)
Can she get a substitute for fenofibrate.  It is not covered by insurance.     Other refills sent in.

## 2017-11-08 NOTE — Telephone Encounter (Signed)
Copied from [place]. Topic: Quick Communication - Rx Refill/Question >> Nov 08, 2017 11:56 AM Arletha Grippe wrote: Medication: fenofibrate (TRICOR) 48 MG tablet - is not covered under new insurance  - can you call in a substitute? atorvastatin (LIPITOR) 40 MG tablet, estradiol (ESTRACE) 2 MG tablet, metoprolol tartrate (LOPRESSOR) 50 MG tablet - all need refills. New pharmacy - all need to go to [facility] Pt asking for 90 day supply.    Has the patient contacted their pharmacy? No. New pharm    (Agent: If no, request that the patient contact the pharmacy for the refill.)   Preferred Pharmacy (with phone number or street name):[facility]    Agent: Please be advised that RX refills may take up to 3 business days. We ask that you follow-up with your pharmacy.

## 2017-11-08 NOTE — Telephone Encounter (Signed)
Maybe different dose will be covered--- try 160 mg fenofibrate 1 po qd

## 2017-11-08 NOTE — Telephone Encounter (Signed)
Pt  Request substitute for medication, change of pharmacy, and new insurance

## 2017-11-09 NOTE — Telephone Encounter (Signed)
Patient will check ins to see if med is covered and call back to let us know.

## 2017-11-10 ENCOUNTER — Telehealth: Payer: Self-pay | Admitting: Family Medicine

## 2017-11-10 NOTE — Telephone Encounter (Signed)
Looks like atorvastain,estradiol,metoprolol was refilled. Fenofibrate was not addressed or approved. Routing back to office fenofibrate refill Last OV: 01/05/17  Last Refill:08/13/17 #90 tablets Pharmacy:[facility]

## 2017-11-10 NOTE — Telephone Encounter (Signed)
Copied from [place] [phone]. Topic: General - Other >> Nov 10, 2017  9:01 AM Lolita Rieger, RMA wrote: Reason for CRM: pt called and stated that the tricor 54  is covered and please send to mail order [facility]

## 2017-11-10 NOTE — Telephone Encounter (Unsigned)
Copied from [place] [phone]. Topic: Quick Communication - See Telephone Encounter >> Nov 10, 2017 11:17 AM Hewitt Shorts wrote: CRM for notification. See Telephone encounter for: pt is needing a refill on atorvastin and estradiol and metopolol and fenofibrate to [facility] fax number is [phone]  11/10/17.

## 2017-11-11 MED ORDER — FENOFIBRATE 54 MG PO TABS: 54.0000 mg | ORAL_TABLET | Freq: Every day | ORAL | 1 refills | Status: DC

## 2017-11-11 NOTE — Telephone Encounter (Signed)
agree

## 2017-11-11 NOTE — Telephone Encounter (Signed)
rx sent and patient notified.  

## 2017-11-11 NOTE — Telephone Encounter (Signed)
rx rent in for the tricor 35

## 2018-01-04 ENCOUNTER — Other Ambulatory Visit: Payer: Self-pay | Admitting: Family Medicine

## 2018-01-04 DIAGNOSIS — E785 Hyperlipidemia, unspecified: Secondary | ICD-10-CM

## 2018-01-04 DIAGNOSIS — Z780 Asymptomatic menopausal state: Secondary | ICD-10-CM

## 2018-01-04 DIAGNOSIS — I10 Essential (primary) hypertension: Secondary | ICD-10-CM

## 2018-01-13 ENCOUNTER — Encounter: Payer: Self-pay | Admitting: Family Medicine

## 2018-01-13 ENCOUNTER — Ambulatory Visit (INDEPENDENT_AMBULATORY_CARE_PROVIDER_SITE_OTHER): Payer: 59 | Admitting: Family Medicine

## 2018-01-13 VITALS — BP 138/80 | HR 62 | Temp 97.6°F | Resp 16 | Ht 65.0 in | Wt 152.8 lb

## 2018-01-13 DIAGNOSIS — I10 Essential (primary) hypertension: Secondary | ICD-10-CM | POA: Diagnosis not present

## 2018-01-13 DIAGNOSIS — Z780 Asymptomatic menopausal state: Secondary | ICD-10-CM

## 2018-01-13 DIAGNOSIS — E785 Hyperlipidemia, unspecified: Secondary | ICD-10-CM | POA: Diagnosis not present

## 2018-01-13 DIAGNOSIS — D229 Melanocytic nevi, unspecified: Secondary | ICD-10-CM | POA: Diagnosis not present

## 2018-01-13 DIAGNOSIS — Z0000 Encounter for general adult medical examination without abnormal findings: Secondary | ICD-10-CM

## 2018-01-13 LAB — CBC WITH DIFFERENTIAL/PLATELET
BASOS PCT: 0.4 % (ref 0.0–3.0)
Basophils Absolute: 0 10*3/uL (ref 0.0–0.1)
EOS PCT: 1.5 % (ref 0.0–5.0)
Eosinophils Absolute: 0.1 10*3/uL (ref 0.0–0.7)
HEMATOCRIT: 36.6 % (ref 36.0–46.0)
HEMOGLOBIN: 12.2 g/dL (ref 12.0–15.0)
LYMPHS PCT: 21 % (ref 12.0–46.0)
Lymphs Abs: 1.3 10*3/uL (ref 0.7–4.0)
MCHC: 33.4 g/dL (ref 30.0–36.0)
MCV: 94.5 fl (ref 78.0–100.0)
Monocytes Absolute: 0.5 10*3/uL (ref 0.1–1.0)
Monocytes Relative: 8.2 % (ref 3.0–12.0)
NEUTROS ABS: 4.4 10*3/uL (ref 1.4–7.7)
Neutrophils Relative %: 68.9 % (ref 43.0–77.0)
PLATELETS: 271 10*3/uL (ref 150.0–400.0)
RBC: 3.87 Mil/uL (ref 3.87–5.11)
RDW: 13.6 % (ref 11.5–15.5)
WBC: 6.3 10*3/uL (ref 4.0–10.5)

## 2018-01-13 LAB — COMPREHENSIVE METABOLIC PANEL
ALBUMIN: 3.9 g/dL (ref 3.5–5.2)
ALT: 18 U/L (ref 0–35)
AST: 25 U/L (ref 0–37)
Alkaline Phosphatase: 49 U/L (ref 39–117)
BUN: 19 mg/dL (ref 6–23)
CALCIUM: 8.9 mg/dL (ref 8.4–10.5)
CHLORIDE: 104 meq/L (ref 96–112)
CO2: 27 meq/L (ref 19–32)
CREATININE: 0.67 mg/dL (ref 0.40–1.20)
GFR: 94.64 mL/min (ref >60.00)
Glucose, Bld: 81 mg/dL (ref 70–99)
POTASSIUM: 4.1 meq/L (ref 3.5–5.1)
Sodium: 138 mEq/L (ref 135–145)
Total Bilirubin: 0.5 mg/dL (ref 0.2–1.2)
Total Protein: 6.7 g/dL (ref 6.0–8.3)

## 2018-01-13 LAB — TSH: TSH: 3.26 u[IU]/mL (ref 0.35–4.50)

## 2018-01-13 LAB — LIPID PANEL
CHOL/HDL RATIO: 3
CHOLESTEROL: 167 mg/dL (ref 0–200)
HDL: 65.3 mg/dL (ref >39.00)
LDL CALC: 80 mg/dL (ref 0–99)
NonHDL: 101.36
TRIGLYCERIDES: 108 mg/dL (ref 0.0–149.0)
VLDL: 21.6 mg/dL (ref 0.0–40.0)

## 2018-01-13 MED ORDER — ESTRADIOL 2 MG PO TABS: ORAL_TABLET | ORAL | 1 refills | Status: DC

## 2018-01-13 MED ORDER — ATORVASTATIN CALCIUM 40 MG PO TABS: ORAL_TABLET | ORAL | 1 refills | Status: DC

## 2018-01-13 MED ORDER — METOPROLOL TARTRATE 50 MG PO TABS: ORAL_TABLET | ORAL | 1 refills | Status: DC

## 2018-01-13 MED ORDER — FENOFIBRATE 54 MG PO TABS: 54.0000 mg | ORAL_TABLET | Freq: Every day | ORAL | 1 refills | Status: DC

## 2018-01-13 NOTE — Assessment & Plan Note (Signed)
Tolerating statin, encouraged heart healthy diet, avoid trans fats, minimize simple carbs and saturated fats. Increase exercise as tolerated 

## 2018-01-13 NOTE — Progress Notes (Signed)
Subjective:     Janet Knox is a 62 y.o. female and is here for a comprehensive physical exam. The patient reports no problems.  Social History   Socioeconomic History  . Marital status: Married    Spouse name: Not on file  . Number of children: Not on file  . Years of education: Not on file  . Highest education level: Not on file  Occupational History  . Occupation: [employer]  Social Needs  . Financial resource strain: Not on file  . Food insecurity:    Worry: Not on file    Inability: Not on file  . Transportation needs:    Medical: Not on file    Non-medical: Not on file  Tobacco Use  . Smoking status: Never Smoker  . Smokeless tobacco: Never Used  Substance and Sexual Activity  . Alcohol use: Yes    Alcohol/week: 0.6 oz    Types: 1 Glasses of wine per week  . Drug use: No  . Sexual activity: Yes    Partners: Male  Lifestyle  . Physical activity:    Days per week: Not on file    Minutes per session: Not on file  . Stress: Not on file  Relationships  . Social connections:    Talks on phone: Not on file    Gets together: Not on file    Attends religious service: Not on file    Active member of club or organization: Not on file    Attends meetings of clubs or organizations: Not on file    Relationship status: Not on file  . Intimate partner violence:    Fear of current or ex partner: Not on file    Emotionally abused: Not on file    Physically abused: Not on file    Forced sexual activity: Not on file  Other Topics Concern  . Not on file  Social History Narrative   Exercise-- 3 x a week for 1 hour   Health Maintenance  Topic Date Due  . MAMMOGRAM  03/19/2016  . DEXA SCAN  03/19/2017  . PAP SMEAR  03/19/2018  . INFLUENZA VACCINE  04/28/2018  . TETANUS/TDAP  03/23/2023  . COLONOSCOPY  09/24/2025  . Hepatitis C Screening  Completed  . HIV Screening  Completed    The following portions of the patient's history were reviewed and updated as  appropriate:  She  has a past medical history of Anemia, Hyperlipidemia, Hypertension, and Skin cancer. She does not have any pertinent problems on file. She  has a past surgical history that includes Abdominal hysterectomy; Knee surgery; and Eye surgery (Left). Her family history includes Alzheimer's disease in her mother; Coronary artery disease in her paternal grandfather; Hyperlipidemia in her father and mother; Hypertension in her father and mother; Stroke in her paternal grandfather. She  reports that she has never smoked. She has never used smokeless tobacco. She reports that she drinks about 0.6 oz of alcohol per week. She reports that she does not use drugs. She has a current medication list which includes the following prescription(s): aspirin, atorvastatin, vitamin d3, coq-10, estradiol, fenofibrate, ferrous sulfate, multivitamin, mupirocin cream, niacin, fish oil, and vitamin e. Current Outpatient Medications on File Prior to Visit  Medication Sig Dispense Refill  . aspirin 81 MG tablet Take 81 mg by mouth daily.    . Cholecalciferol (VITAMIN D3) 5000 UNITS TABS Take 1 tablet by mouth daily.     . Coenzyme Q10 (COQ-10) 200 MG CAPS  Take 1 capsule by mouth daily.    . Ferrous Sulfate 27 MG TABS Take 1 tablet by mouth daily.    . Multiple Vitamin (MULTIVITAMIN) tablet Take 1 tablet by mouth daily.    . mupirocin cream (BACTROBAN) 2 % Apply 1 application topically 2 (two) times daily. 15 g 0  . niacin 500 MG tablet Take 2 tablets (1,000 mg total) by mouth daily with breakfast. 180 tablet 3  . Omega-3 Fatty Acids (FISH OIL) 1200 MG CAPS Take 1 capsule by mouth daily.     . vitamin E 400 UNIT capsule Take 400 Units by mouth daily.     No current facility-administered medications on file prior to visit.    She has No Known Allergies..  Review of Systems Review of Systems  Constitutional: Negative for activity change, appetite change and fatigue.  HENT: Negative for hearing loss,  congestion, tinnitus and ear discharge.  dentist q62m Eyes: Negative for visual disturbance (see optho q1y -- vision corrected to 20/20 with glasses).  Respiratory: Negative for cough, chest tightness and shortness of breath.   Cardiovascular: Negative for chest pain, palpitations and leg swelling.  Gastrointestinal: Negative for abdominal pain, diarrhea, constipation and abdominal distention.  Genitourinary: Negative for urgency, frequency, decreased urine volume and difficulty urinating.  Musculoskeletal: Negative for back pain, arthralgias and gait problem.  Skin: Negative for color change, pallor and rash.  Neurological: Negative for dizziness, light-headedness, numbness and headaches.  Hematological: Negative for adenopathy. Does not bruise/bleed easily.  Psychiatric/Behavioral: Negative for suicidal ideas, confusion, sleep disturbance, self-injury, dysphoric mood, decreased concentration and agitation.      Objective:    BP 138/80   Pulse 62   Temp 97.6 F (36.4 C) (Oral)   Resp 16   Ht 5\' 5"  (1.651 m)   Wt 152 lb 12.8 oz (69.3 kg)   SpO2 98%   BMI 25.43 kg/m  General appearance: alert, cooperative, appears stated age and no distress Head: Normocephalic, without obvious abnormality, atraumatic Eyes: negative findings: lids and lashes normal, conjunctivae and sclerae normal and pupils equal, round, reactive to light and accomodation Ears: normal TM's and external ear canals both ears Nose: Nares normal. Septum midline. Mucosa normal. No drainage or sinus tenderness. Throat: lips, mucosa, and tongue normal; teeth and gums normal Neck: no adenopathy, no carotid bruit, no JVD, supple, symmetrical, trachea midline and thyroid not enlarged, symmetric, no tenderness/mass/nodules Back: symmetric, no curvature. ROM normal. No CVA tenderness. Lungs: clear to auscultation bilaterally Breasts: gyn Heart: regular rate and rhythm, S1, S2 normal, no murmur, click, rub or gallop Abdomen:  soft, non-tender; bowel sounds normal; no masses,  no organomegaly Pelvic: deferred --gyn Extremities: extremities normal, atraumatic, no cyanosis or edema Pulses: 2+ and symmetric Skin: Skin color, texture, turgor normal. No rashes or lesions Lymph nodes: Cervical, supraclavicular, and axillary nodes normal. Neurologic: Alert and oriented X 3, normal strength and tone. Normal symmetric reflexes. Normal coordination and gait    Assessment:    Healthy female exam.      Plan:    ghm utd  Check labs See After Visit Summary for Counseling Recommendations      1. Menopause  - estradiol (ESTRACE) 2 MG tablet; TAKE 1 TABLET BY MOUTH  DAILY  Dispense: 90 tablet; Refill: 1  2. Hyperlipidemia, unspecified hyperlipidemia type Tolerating statin, encouraged heart healthy diet, avoid trans fats, minimize simple carbs and saturated fats. Increase exercise as tolerated - atorvastatin (LIPITOR) 40 MG tablet; TAKE 1 TABLET BY MOUTH AT  BEDTIME  Dispense: 90 tablet; Refill: 1 - CBC with Differential/Platelet - Comprehensive metabolic panel - Lipid panel - TSH  3. Essential hypertension Well controlled, no changes to meds. Encouraged heart healthy diet such as the DASH diet and exercise as tolerated.  - CBC with Differential/Platelet - Comprehensive metabolic panel - Lipid panel - TSH  4. Preventative health care See above - estradiol (ESTRACE) 2 MG tablet; TAKE 1 TABLET BY MOUTH  DAILY  Dispense: 90 tablet; Refill: 1 - fenofibrate 54 MG tablet; Take 1 tablet (54 mg total) by mouth daily.  Dispense: 90 tablet; Refill: 1 - atorvastatin (LIPITOR) 40 MG tablet; TAKE 1 TABLET BY MOUTH AT  BEDTIME  Dispense: 90 tablet; Refill: 1 - CBC with Differential/Platelet - Comprehensive metabolic panel - Lipid panel - TSH  5. Suspicious nevus Pt requesting derm for skin check - Ambulatory referral to Dermatology

## 2018-01-13 NOTE — Patient Instructions (Signed)
Preventive Care 40-64 Years, Female Preventive care refers to lifestyle choices and visits with your health care provider that can promote health and wellness. What does preventive care include?  A yearly physical exam. This is also called an annual well check.  Dental exams once or twice a year.  Routine eye exams. Ask your health care provider how often you should have your eyes checked.  Personal lifestyle choices, including: ? Daily care of your teeth and gums. ? Regular physical activity. ? Eating a healthy diet. ? Avoiding tobacco and drug use. ? Limiting alcohol use. ? Practicing safe sex. ? Taking low-dose aspirin daily starting at age 58. ? Taking vitamin and mineral supplements as recommended by your health care provider. What happens during an annual well check? The services and screenings done by your health care provider during your annual well check will depend on your age, overall health, lifestyle risk factors, and family history of disease. Counseling Your health care provider may ask you questions about your:  Alcohol use.  Tobacco use.  Drug use.  Emotional well-being.  Home and relationship well-being.  Sexual activity.  Eating habits.  Work and work Statistician.  Method of birth control.  Menstrual cycle.  Pregnancy history.  Screening You may have the following tests or measurements:  Height, weight, and BMI.  Blood pressure.  Lipid and cholesterol levels. These may be checked every 5 years, or more frequently if you are over 81 years old.  Skin check.  Lung cancer screening. You may have this screening every year starting at age 78 if you have a 30-pack-year history of smoking and currently smoke or have quit within the past 15 years.  Fecal occult blood test (FOBT) of the stool. You may have this test every year starting at age 65.  Flexible sigmoidoscopy or colonoscopy. You may have a sigmoidoscopy every 5 years or a colonoscopy  every 10 years starting at age 30.  Hepatitis C blood test.  Hepatitis B blood test.  Sexually transmitted disease (STD) testing.  Diabetes screening. This is done by checking your blood sugar (glucose) after you have not eaten for a while (fasting). You may have this done every 1-3 years.  Mammogram. This may be done every 1-2 years. Talk to your health care provider about when you should start having regular mammograms. This may depend on whether you have a family history of breast cancer.  BRCA-related cancer screening. This may be done if you have a family history of breast, ovarian, tubal, or peritoneal cancers.  Pelvic exam and Pap test. This may be done every 3 years starting at age 80. Starting at age 36, this may be done every 5 years if you have a Pap test in combination with an HPV test.  Bone density scan. This is done to screen for osteoporosis. You may have this scan if you are at high risk for osteoporosis.  Discuss your test results, treatment options, and if necessary, the need for more tests with your health care provider. Vaccines Your health care provider may recommend certain vaccines, such as:  Influenza vaccine. This is recommended every year.  Tetanus, diphtheria, and acellular pertussis (Tdap, Td) vaccine. You may need a Td booster every 10 years.  Varicella vaccine. You may need this if you have not been vaccinated.  Zoster vaccine. You may need this after age 5.  Measles, mumps, and rubella (MMR) vaccine. You may need at least one dose of MMR if you were born in  1957 or later. You may also need a second dose.  Pneumococcal 13-valent conjugate (PCV13) vaccine. You may need this if you have certain conditions and were not previously vaccinated.  Pneumococcal polysaccharide (PPSV23) vaccine. You may need one or two doses if you smoke cigarettes or if you have certain conditions.  Meningococcal vaccine. You may need this if you have certain  conditions.  Hepatitis A vaccine. You may need this if you have certain conditions or if you travel or work in places where you may be exposed to hepatitis A.  Hepatitis B vaccine. You may need this if you have certain conditions or if you travel or work in places where you may be exposed to hepatitis B.  Haemophilus influenzae type b (Hib) vaccine. You may need this if you have certain conditions.  Talk to your health care provider about which screenings and vaccines you need and how often you need them. This information is not intended to replace advice given to you by your health care provider. Make sure you discuss any questions you have with your health care provider. Document Released: 10/11/2015 Document Revised: 06/03/2016 Document Reviewed: 07/16/2015 Elsevier Interactive Patient Education  2018 Elsevier Inc.  

## 2018-01-13 NOTE — Assessment & Plan Note (Signed)
Well controlled, no changes to meds. Encouraged heart healthy diet such as the DASH diet and exercise as tolerated.  °

## 2018-01-24 DIAGNOSIS — R410 Disorientation, unspecified: Secondary | ICD-10-CM | POA: Diagnosis not present

## 2018-01-24 DIAGNOSIS — R51 Headache: Secondary | ICD-10-CM | POA: Diagnosis not present

## 2018-01-24 DIAGNOSIS — I6523 Occlusion and stenosis of bilateral carotid arteries: Secondary | ICD-10-CM | POA: Diagnosis not present

## 2018-01-24 DIAGNOSIS — E876 Hypokalemia: Secondary | ICD-10-CM | POA: Diagnosis not present

## 2018-01-24 DIAGNOSIS — G43909 Migraine, unspecified, not intractable, without status migrainosus: Secondary | ICD-10-CM | POA: Diagnosis not present

## 2018-01-24 DIAGNOSIS — Z90710 Acquired absence of both cervix and uterus: Secondary | ICD-10-CM | POA: Diagnosis not present

## 2018-01-25 DIAGNOSIS — E785 Hyperlipidemia, unspecified: Secondary | ICD-10-CM | POA: Diagnosis not present

## 2018-01-25 DIAGNOSIS — I6523 Occlusion and stenosis of bilateral carotid arteries: Secondary | ICD-10-CM | POA: Diagnosis not present

## 2018-01-25 DIAGNOSIS — I10 Essential (primary) hypertension: Secondary | ICD-10-CM | POA: Diagnosis not present

## 2018-01-25 DIAGNOSIS — R51 Headache: Secondary | ICD-10-CM | POA: Diagnosis not present

## 2018-01-25 MED ORDER — METOPROLOL TARTRATE 50 MG PO TABS
100.00 | ORAL_TABLET | ORAL | Status: DC
Start: 2018-01-25 — End: 2018-01-25

## 2018-01-25 MED ORDER — ENOXAPARIN SODIUM 40 MG/0.4ML ~~LOC~~ SOLN
40.00 | SUBCUTANEOUS | Status: DC
Start: ? — End: 2018-01-25

## 2018-01-25 MED ORDER — POTASSIUM CHLORIDE CRYS ER 20 MEQ PO TBCR
EXTENDED_RELEASE_TABLET | ORAL | Status: DC
Start: 2018-01-25 — End: 2018-01-25

## 2018-01-25 MED ORDER — DOCUSATE SODIUM 100 MG PO CAPS
100.00 | ORAL_CAPSULE | ORAL | Status: DC
Start: ? — End: 2018-01-25

## 2018-01-25 MED ORDER — CLONIDINE HCL 0.1 MG PO TABS
0.10 | ORAL_TABLET | ORAL | Status: DC
Start: ? — End: 2018-01-25

## 2018-01-25 MED ORDER — FENOFIBRATE MICRONIZED 67 MG PO CAPS
67.00 | ORAL_CAPSULE | ORAL | Status: DC
Start: ? — End: 2018-01-25

## 2018-01-25 MED ORDER — ALUMINUM-MAGNESIUM-SIMETHICONE 200-200-20 MG/5ML PO SUSP
30.00 | ORAL | Status: DC
Start: ? — End: 2018-01-25

## 2018-01-25 MED ORDER — TEMAZEPAM 15 MG PO CAPS
15.00 | ORAL_CAPSULE | ORAL | Status: DC
Start: ? — End: 2018-01-25

## 2018-01-25 MED ORDER — ESTRADIOL 1 MG PO TABS
2.00 | ORAL_TABLET | ORAL | Status: DC
Start: 2018-01-25 — End: 2018-01-25

## 2018-01-25 MED ORDER — TRAMADOL HCL 50 MG PO TABS
50.00 | ORAL_TABLET | ORAL | Status: DC
Start: ? — End: 2018-01-25

## 2018-01-25 MED ORDER — ATORVASTATIN CALCIUM 40 MG PO TABS
40.00 | ORAL_TABLET | ORAL | Status: DC
Start: 2018-01-25 — End: 2018-01-25

## 2018-01-25 MED ORDER — ACETAMINOPHEN 325 MG PO TABS
650.00 | ORAL_TABLET | ORAL | Status: DC
Start: ? — End: 2018-01-25

## 2018-01-25 MED ORDER — ONDANSETRON HCL 4 MG/2ML IJ SOLN
4.00 | INTRAMUSCULAR | Status: DC
Start: ? — End: 2018-01-25

## 2018-01-25 MED ORDER — ASPIRIN 81 MG PO CHEW
81.00 | CHEWABLE_TABLET | ORAL | Status: DC
Start: 2018-01-25 — End: 2018-01-25

## 2018-01-25 MED ORDER — OXYCODONE-ACETAMINOPHEN 5-325 MG PO TABS
2.00 | ORAL_TABLET | ORAL | Status: DC
Start: ? — End: 2018-01-25

## 2018-01-25 MED ORDER — CHOLECALCIFEROL 25 MCG (1000 UT) PO TABS
1000.00 | ORAL_TABLET | ORAL | Status: DC
Start: 2018-01-25 — End: 2018-01-25

## 2018-01-27 ENCOUNTER — Telehealth: Payer: Self-pay | Admitting: *Deleted

## 2018-01-27 NOTE — Telephone Encounter (Signed)
Copied from [place] [id number]. 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 Patient needs advice on follow up regarding these two issues.

## 2018-01-28 NOTE — Telephone Encounter (Signed)
Ov scheduled for 02/04/18

## 2018-02-04 ENCOUNTER — Ambulatory Visit: Payer: 59 | Admitting: Family Medicine

## 2018-02-04 ENCOUNTER — Encounter: Payer: Self-pay | Admitting: Family Medicine

## 2018-02-04 VITALS — BP 130/86 | HR 63 | Temp 98.0°F | Resp 16 | Ht 65.0 in | Wt 153.8 lb

## 2018-02-04 DIAGNOSIS — E041 Nontoxic single thyroid nodule: Secondary | ICD-10-CM

## 2018-02-04 DIAGNOSIS — Z8669 Personal history of other diseases of the nervous system and sense organs: Secondary | ICD-10-CM | POA: Diagnosis not present

## 2018-02-04 DIAGNOSIS — E785 Hyperlipidemia, unspecified: Secondary | ICD-10-CM

## 2018-02-04 DIAGNOSIS — E042 Nontoxic multinodular goiter: Secondary | ICD-10-CM | POA: Insufficient documentation

## 2018-02-04 DIAGNOSIS — I6523 Occlusion and stenosis of bilateral carotid arteries: Secondary | ICD-10-CM

## 2018-02-04 MED ORDER — SUMATRIPTAN SUCCINATE 50 MG PO TABS: 50.0000 mg | ORAL_TABLET | ORAL | 0 refills | Status: DC | PRN

## 2018-02-04 MED ORDER — ROSUVASTATIN CALCIUM 20 MG PO TABS: 20.0000 mg | ORAL_TABLET | Freq: Every day | ORAL | 3 refills | Status: DC

## 2018-02-04 NOTE — Progress Notes (Deleted)
Patient ID: Janet Knox, female   DOB: 1956/05/18, 62 y.o.   MRN: [id number]     Subjective:  I acted as a Education administrator for Dr. Carollee Herter.  Guerry Bruin, [place]   Patient ID: Janet Knox, female    DOB: Oct 22, 1955, 62 y.o.   MRN: [id number]  Chief Complaint  Patient presents with  . Hospitalization Follow-up    4/29-4/30    HPI  Patient is in today for ED follow up.   Patient Care Team: Carollee Herter, Alferd Apa, DO as PCP - General Maisie Fus, MD as Consulting Physician (Obstetrics and Gynecology) Sherlynn Stalls, MD as Consulting Physician (Ophthalmology) Barbaraann Cao, OD as Referring Physician (Optometry) Darleen Crocker, MD as Consulting Physician (Ophthalmology)   Past Medical History:  Diagnosis Date  . Anemia   . Hyperlipidemia   . Hypertension   . Skin cancer     Past Surgical History:  Procedure Laterality Date  . ABDOMINAL HYSTERECTOMY    . EYE SURGERY Left    Torn Retina  . KNEE SURGERY      Family History  Problem Relation Age of Onset  . Alzheimer's disease Mother   . Hyperlipidemia Mother   . Hypertension Mother   . Coronary artery disease Paternal Grandfather   . Stroke Paternal Grandfather   . Hyperlipidemia Father   . Hypertension Father     Social History   Socioeconomic History  . Marital status: Married    Spouse name: Not on file  . Number of children: Not on file  . Years of education: Not on file  . Highest education level: Not on file  Occupational History  . Occupation: [employer]  Social Needs  . Financial resource strain: Not on file  . Food insecurity:    Worry: Not on file    Inability: Not on file  . Transportation needs:    Medical: Not on file    Non-medical: Not on file  Tobacco Use  . Smoking status: Never Smoker  . Smokeless tobacco: Never Used  Substance and Sexual Activity  . Alcohol use: Yes    Alcohol/week: 0.6 oz    Types: 1 Glasses of wine per week  . Drug use: No  . Sexual activity: Yes   Partners: Male  Lifestyle  . Physical activity:    Days per week: Not on file    Minutes per session: Not on file  . Stress: Not on file  Relationships  . Social connections:    Talks on phone: Not on file    Gets together: Not on file    Attends religious service: Not on file    Active member of club or organization: Not on file    Attends meetings of clubs or organizations: Not on file    Relationship status: Not on file  . Intimate partner violence:    Fear of current or ex partner: Not on file    Emotionally abused: Not on file    Physically abused: Not on file    Forced sexual activity: Not on file  Other Topics Concern  . Not on file  Social History Narrative   Exercise-- 3 x a week for 1 hour    Outpatient Medications Prior to Visit  Medication Sig Dispense Refill  . aspirin 81 MG tablet Take 81 mg by mouth daily.    Marland Kitchen atorvastatin (LIPITOR) 40 MG tablet TAKE 1 TABLET BY MOUTH AT  BEDTIME 90 tablet 1  . Cholecalciferol (VITAMIN D3) 5000  UNITS TABS Take 1 tablet by mouth daily.     . Coenzyme Q10 (COQ-10) 200 MG CAPS Take 1 capsule by mouth daily.    Marland Kitchen estradiol (ESTRACE) 2 MG tablet TAKE 1 TABLET BY MOUTH  DAILY 90 tablet 1  . fenofibrate 54 MG tablet Take 1 tablet (54 mg total) by mouth daily. 90 tablet 1  . Multiple Vitamin (MULTIVITAMIN) tablet Take 1 tablet by mouth daily.    . mupirocin cream (BACTROBAN) 2 % Apply 1 application topically 2 (two) times daily. 15 g 0  . niacin 500 MG tablet Take 2 tablets (1,000 mg total) by mouth daily with breakfast. 180 tablet 3  . Omega-3 Fatty Acids (FISH OIL) 1200 MG CAPS Take 1 capsule by mouth daily.     . vitamin E 400 UNIT capsule Take 400 Units by mouth daily.    . Ferrous Sulfate 27 MG TABS Take 1 tablet by mouth daily.     No facility-administered medications prior to visit.     No Known Allergies  ROS     Objective:    Physical Exam  BP 130/86 (BP Location: Right Arm, Cuff Size: Normal)   Pulse 63   Temp  98 F (36.7 C) (Oral)   Resp 16   Ht 5\' 5"  (1.651 m)   Wt 153 lb 12.8 oz (69.8 kg)   SpO2 98%   BMI 25.59 kg/m  Wt Readings from Last 3 Encounters:  02/04/18 153 lb 12.8 oz (69.8 kg)  01/13/18 152 lb 12.8 oz (69.3 kg)  03/16/17 150 lb 9.6 oz (68.3 kg)   BP Readings from Last 3 Encounters:  02/04/18 130/86  01/13/18 138/80  03/16/17 126/82     Immunization History  Administered Date(s) Administered  . Influenza Whole 07/26/2007, 07/20/2008  . Influenza,inj,Quad PF,6+ Mos 07/26/2015, 06/23/2016, 07/15/2017  . Pneumococcal Polysaccharide-23 12/31/2015  . Td 11/03/2002  . Tdap 03/22/2013  . Zoster 08/13/2015  . Zoster Recombinat (Shingrix) 01/05/2017, 07/01/2017    Health Maintenance  Topic Date Due  . MAMMOGRAM  03/19/2016  . DEXA SCAN  03/19/2017  . PAP SMEAR  03/19/2018  . INFLUENZA VACCINE  04/28/2018  . TETANUS/TDAP  03/23/2023  . COLONOSCOPY  09/24/2025  . Hepatitis C Screening  Completed  . HIV Screening  Completed    Lab Results  Component Value Date   WBC 6.3 01/13/2018   HGB 12.2 01/13/2018   HCT 36.6 01/13/2018   PLT 271.0 01/13/2018   GLUCOSE 81 01/13/2018   CHOL 167 01/13/2018   TRIG 108.0 01/13/2018   HDL 65.30 01/13/2018   LDLDIRECT 117.5 01/27/2010   LDLCALC 80 01/13/2018   ALT 18 01/13/2018   AST 25 01/13/2018   NA 138 01/13/2018   K 4.1 01/13/2018   CL 104 01/13/2018   CREATININE 0.67 01/13/2018   BUN 19 01/13/2018   CO2 27 01/13/2018   TSH 3.26 01/13/2018   MICROALBUR 0.7 03/22/2013    Lab Results  Component Value Date   TSH 3.26 01/13/2018   Lab Results  Component Value Date   WBC 6.3 01/13/2018   HGB 12.2 01/13/2018   HCT 36.6 01/13/2018   MCV 94.5 01/13/2018   PLT 271.0 01/13/2018   Lab Results  Component Value Date   NA 138 01/13/2018   K 4.1 01/13/2018   CO2 27 01/13/2018   GLUCOSE 81 01/13/2018   BUN 19 01/13/2018   CREATININE 0.67 01/13/2018   BILITOT 0.5 01/13/2018   ALKPHOS 49 01/13/2018   AST  25 01/13/2018    ALT 18 01/13/2018   PROT 6.7 01/13/2018   ALBUMIN 3.9 01/13/2018   CALCIUM 8.9 01/13/2018   ANIONGAP 10 08/26/2016   GFR 94.64 01/13/2018   Lab Results  Component Value Date   CHOL 167 01/13/2018   Lab Results  Component Value Date   HDL 65.30 01/13/2018   Lab Results  Component Value Date   LDLCALC 80 01/13/2018   Lab Results  Component Value Date   TRIG 108.0 01/13/2018   Lab Results  Component Value Date   CHOLHDL 3 01/13/2018   No results found for: HGBA1C       Assessment & Plan:   Problem List Items Addressed This Visit    None      I am having Lowella Petties maintain her vitamin E, multivitamin, aspirin, CoQ-10, Fish Oil, Vitamin D3, niacin, Ferrous Sulfate, mupirocin cream, estradiol, fenofibrate, and atorvastatin.  No orders of the defined types were placed in this encounter.   {PROVIDER TO DELETE} Jerene Dilling, CMA

## 2018-02-04 NOTE — Assessment & Plan Note (Signed)
Refer to endo  Lab Results  Component Value Date   TSH 3.26 01/13/2018

## 2018-02-04 NOTE — Assessment & Plan Note (Signed)
Aspirin  See carotid US Refer to vascular surgery

## 2018-02-04 NOTE — Patient Instructions (Signed)
Migraine Headache A migraine headache is an intense, throbbing pain on one side or both sides of the head. Migraines may also cause other symptoms, such as nausea, vomiting, and sensitivity to light and noise. What are the causes? Doing or taking certain things may also trigger migraines, such as:  Alcohol.  Smoking.  Medicines, such as: ? Medicine used to treat chest pain (nitroglycerine). ? Birth control pills. ? Estrogen pills. ? Certain blood pressure medicines.  Aged cheeses, chocolate, or caffeine.  Foods or drinks that contain nitrates, glutamate, aspartame, or tyramine.  Physical activity.  Other things that may trigger a migraine include:  Menstruation.  Pregnancy.  Hunger.  Stress, lack of sleep, too much sleep, or fatigue.  Weather changes.  What increases the risk? The following factors may make you more likely to experience migraine headaches:  Age. Risk increases with age.  Family history of migraine headaches.  Being Caucasian.  Depression and anxiety.  Obesity.  Being a woman.  Having a hole in the heart (patent foramen ovale) or other heart problems.  What are the signs or symptoms? The main symptom of this condition is pulsating or throbbing pain. Pain may:  Happen in any area of the head, such as on one side or both sides.  Interfere with daily activities.  Get worse with physical activity.  Get worse with exposure to bright lights or loud noises.  Other symptoms may include:  Nausea.  Vomiting.  Dizziness.  General sensitivity to bright lights, loud noises, or smells.  Before you get a migraine, you may get warning signs that a migraine is developing (aura). An aura may include:  Seeing flashing lights or having blind spots.  Seeing bright spots, halos, or zigzag lines.  Having tunnel vision or blurred vision.  Having numbness or a tingling feeling.  Having trouble talking.  Having muscle weakness.  How is this  diagnosed? A migraine headache can be diagnosed based on:  Your symptoms.  A physical exam.  Tests, such as CT scan or MRI of the head. These imaging tests can help rule out other causes of headaches.  Taking fluid from the spine (lumbar puncture) and analyzing it (cerebrospinal fluid analysis, or CSF analysis).  How is this treated? A migraine headache is usually treated with medicines that:  Relieve pain.  Relieve nausea.  Prevent migraines from coming back.  Treatment may also include:  Acupuncture.  Lifestyle changes like avoiding foods that trigger migraines.  Follow these instructions at home: Medicines  Take over-the-counter and prescription medicines only as told by your health care provider.  Do not drive or use heavy machinery while taking prescription pain medicine.  To prevent or treat constipation while you are taking prescription pain medicine, your health care provider may recommend that you: ? Drink enough fluid to keep your urine clear or pale yellow. ? Take over-the-counter or prescription medicines. ? Eat foods that are high in fiber, such as fresh fruits and vegetables, whole grains, and beans. ? Limit foods that are high in fat and processed sugars, such as fried and sweet foods. Lifestyle  Avoid alcohol use.  Do not use any products that contain nicotine or tobacco, such as cigarettes and e-cigarettes. If you need help quitting, ask your health care provider.  Get at least 8 hours of sleep every night.  Limit your stress. General instructions   Keep a journal to find out what may trigger your migraine headaches. For example, write down: ? What you eat and   drink. ? How much sleep you get. ? Any change to your diet or medicines.  If you have a migraine: ? Avoid things that make your symptoms worse, such as bright lights. ? It may help to lie down in a dark, quiet room. ? Do not drive or use heavy machinery. ? Ask your health care provider  what activities are safe for you while you are experiencing symptoms.  Keep all follow-up visits as told by your health care provider. This is important. Contact a health care provider if:  You develop symptoms that are different or more severe than your usual migraine symptoms. Get help right away if:  Your migraine becomes severe.  You have a fever.  You have a stiff neck.  You have vision loss.  Your muscles feel weak or like you cannot control them.  You start to lose your balance often.  You develop trouble walking.  You faint. This information is not intended to replace advice given to you by your health care provider. Make sure you discuss any questions you have with your health care provider. Document Released: 09/14/2005 Document Revised: 04/03/2016 Document Reviewed: 03/02/2016 Elsevier Interactive Patient Education  2017 [employer]   

## 2018-02-04 NOTE — Assessment & Plan Note (Signed)
Encouraged heart healthy diet, increase exercise, avoid trans fats, consider a krill oil cap daily With carotid stenosis -- - pt would like to switch to stronger statin if possible Switch to crestor 20 mg and recheck labs in 3 months

## 2018-02-04 NOTE — Progress Notes (Signed)
Patient ID: Janet Knox, female    DOB: Jun 10, 1956  Age: 62 y.o. MRN: [id number]    Subjective:  Subjective  HPI Janet Knox presents for f/u admission / ed visit --- [facility] .   She presented to er with headache and confusion and was admitted to r/o stroke.  Pt had carotid US --  Carotid stenosis b/l  R>L And thyroid nodules were found as well and referral to vascular surgeon and endo were recommended.    Review of Systems  Constitutional: Negative for activity change, appetite change, fatigue and unexpected weight change.  Respiratory: Negative for cough and shortness of breath.   Cardiovascular: Negative for chest pain and palpitations.  Psychiatric/Behavioral: Negative for behavioral problems and dysphoric mood. The patient is not nervous/anxious.     History Past Medical History:  Diagnosis Date  . Anemia   . Hyperlipidemia   . Hypertension   . Skin cancer     She has a past surgical history that includes Abdominal hysterectomy; Knee surgery; and Eye surgery (Left).   Her family history includes Alzheimer's disease in her mother; Coronary artery disease in her paternal grandfather; Hyperlipidemia in her father and mother; Hypertension in her father and mother; Stroke in her paternal grandfather.She reports that she has never smoked. She has never used smokeless tobacco. She reports that she drinks about 0.6 oz of alcohol per week. She reports that she does not use drugs.  Current Outpatient Medications on File Prior to Visit  Medication Sig Dispense Refill  . aspirin 81 MG tablet Take 81 mg by mouth daily.    . Cholecalciferol (VITAMIN D3) 5000 UNITS TABS Take 1 tablet by mouth daily.     . Coenzyme Q10 (COQ-10) 200 MG CAPS Take 1 capsule by mouth daily.    Marland Kitchen estradiol (ESTRACE) 2 MG tablet TAKE 1 TABLET BY MOUTH  DAILY 90 tablet 1  . fenofibrate 54 MG tablet Take 1 tablet (54 mg total) by mouth daily. 90 tablet 1  . Multiple Vitamin (MULTIVITAMIN) tablet Take 1  tablet by mouth daily.    . mupirocin cream (BACTROBAN) 2 % Apply 1 application topically 2 (two) times daily. 15 g 0  . niacin 500 MG tablet Take 2 tablets (1,000 mg total) by mouth daily with breakfast. 180 tablet 3  . Omega-3 Fatty Acids (FISH OIL) 1200 MG CAPS Take 1 capsule by mouth daily.     . vitamin E 400 UNIT capsule Take 400 Units by mouth daily.    . Ferrous Sulfate 27 MG TABS Take 1 tablet by mouth daily.     No current facility-administered medications on file prior to visit.      Objective:  Objective  Physical Exam  Constitutional: She is oriented to person, place, and time. She appears well-developed and well-nourished.  HENT:  Head: Normocephalic and atraumatic.  Eyes: Conjunctivae and EOM are normal.  Neck: Normal range of motion. Neck supple. No JVD present. Carotid bruit is not present. No thyromegaly present.  Cardiovascular: Normal rate, regular rhythm and normal heart sounds.  No murmur heard. Pulmonary/Chest: Effort normal and breath sounds normal. No respiratory distress. She has no wheezes. She has no rales. She exhibits no tenderness.  Musculoskeletal: She exhibits no edema.  Neurological: She is alert and oriented to person, place, and time.  Psychiatric: She has a normal mood and affect.  Nursing note and vitals reviewed.  BP 130/86 (BP Location: Right Arm, Cuff Size: Normal)   Pulse 63  Temp 98 F (36.7 C) (Oral)   Resp 16   Ht 5\' 5"  (1.651 m)   Wt 153 lb 12.8 oz (69.8 kg)   SpO2 98%   BMI 25.59 kg/m  Wt Readings from Last 3 Encounters:  02/04/18 153 lb 12.8 oz (69.8 kg)  01/13/18 152 lb 12.8 oz (69.3 kg)  03/16/17 150 lb 9.6 oz (68.3 kg)     Lab Results  Component Value Date   WBC 6.3 01/13/2018   HGB 12.2 01/13/2018   HCT 36.6 01/13/2018   PLT 271.0 01/13/2018   GLUCOSE 81 01/13/2018   CHOL 167 01/13/2018   TRIG 108.0 01/13/2018   HDL 65.30 01/13/2018   LDLDIRECT 117.5 01/27/2010   LDLCALC 80 01/13/2018   ALT 18 01/13/2018    AST 25 01/13/2018   NA 138 01/13/2018   K 4.1 01/13/2018   CL 104 01/13/2018   CREATININE 0.67 01/13/2018   BUN 19 01/13/2018   CO2 27 01/13/2018   TSH 3.26 01/13/2018   MICROALBUR 0.7 03/22/2013    No results found.   Assessment & Plan:  Plan  I have discontinued Janet Knox's atorvastatin. I am also having her start on rosuvastatin and SUMAtriptan. Additionally, I am having her maintain her vitamin E, multivitamin, aspirin, CoQ-10, Fish Oil, Vitamin D3, niacin, Ferrous Sulfate, mupirocin cream, estradiol, and fenofibrate.  Meds ordered this encounter  Medications  . rosuvastatin (CRESTOR) 20 MG tablet    Sig: Take 1 tablet (20 mg total) by mouth daily.    Dispense:  90 tablet    Refill:  3  . SUMAtriptan (IMITREX) 50 MG tablet    Sig: Take 1 tablet (50 mg total) by mouth every 2 (two) hours as needed for migraine. May repeat in 2 hours if headache persists or recurs.    Dispense:  10 tablet    Refill:  0    Problem List Items Addressed This Visit      Unprioritized   Bilateral carotid artery stenosis - Primary    Aspirin  See carotid US Refer to vascular surgery       Relevant Medications   rosuvastatin (CRESTOR) 20 MG tablet   Other Relevant Orders   Ambulatory referral to Vascular Surgery   History of migraine    imitrex Imaging from ER reviewed in care everywhere Headaches very rare      Relevant Medications   SUMAtriptan (IMITREX) 50 MG tablet   Hyperlipidemia LDL goal <70    Encouraged heart healthy diet, increase exercise, avoid trans fats, consider a krill oil cap daily With carotid stenosis -- - pt would like to switch to stronger statin if possible Switch to crestor 20 mg and recheck labs in 3 months       Relevant Medications   rosuvastatin (CRESTOR) 20 MG tablet   Thyroid nodule    Refer to endo  Lab Results  Component Value Date   TSH 3.26 01/13/2018         Relevant Orders   Ambulatory referral to Endocrinology    pt here > 30  min --  With >50% face to face discussing migraines, med and imaging done in ER   Follow-up: Return if symptoms worsen or fail to improve.  Ann Held, DO

## 2018-02-04 NOTE — Assessment & Plan Note (Signed)
imitrex Imaging from ER reviewed in care everywhere Headaches very rare

## 2018-02-08 ENCOUNTER — Encounter: Payer: Self-pay | Admitting: Family Medicine

## 2018-03-02 ENCOUNTER — Other Ambulatory Visit: Payer: Self-pay

## 2018-03-02 DIAGNOSIS — I6523 Occlusion and stenosis of bilateral carotid arteries: Secondary | ICD-10-CM

## 2018-03-07 ENCOUNTER — Encounter: Payer: Self-pay | Admitting: Family Medicine

## 2018-03-08 NOTE — Telephone Encounter (Signed)
Stop crestor Inc lipitor to 80 mg #90 1 refills --- recheck cmp, lipid in 3 months

## 2018-03-23 ENCOUNTER — Ambulatory Visit: Payer: 59 | Admitting: Vascular Surgery

## 2018-03-23 ENCOUNTER — Ambulatory Visit (HOSPITAL_COMMUNITY)
Admission: RE | Admit: 2018-03-23 | Discharge: 2018-03-23 | Disposition: A | Discharge status: Home / Self Care | Payer: 59 | Source: Ambulatory Visit | Attending: Vascular Surgery | Admitting: Vascular Surgery

## 2018-03-23 ENCOUNTER — Encounter

## 2018-03-23 ENCOUNTER — Other Ambulatory Visit: Payer: Self-pay

## 2018-03-23 ENCOUNTER — Encounter: Payer: Self-pay | Admitting: Vascular Surgery

## 2018-03-23 DIAGNOSIS — I779 Disorder of arteries and arterioles, unspecified: Secondary | ICD-10-CM | POA: Insufficient documentation

## 2018-03-23 DIAGNOSIS — I6523 Occlusion and stenosis of bilateral carotid arteries: Secondary | ICD-10-CM

## 2018-03-23 DIAGNOSIS — I739 Peripheral vascular disease, unspecified: Secondary | ICD-10-CM

## 2018-03-23 NOTE — Progress Notes (Signed)
New Carotid Patient  Requested by:  [address], [place], [place] [address] [place], [place] [address]  Reason for consultation: bilateral carotid stenosis   History of Present Illness   Janet Knox is a 62 y.o. (12-03-1955) female who presents with chief complaint: abnormal carotid duplex.  This patient was seen recently at [facility] for atypical migraine.  She got a Head MRI which was negative.  Previous carotid studies demonstrated: RICA 86-76% stenosis, LICA 19-50% stenosis.  Patient has no history of TIA or stroke symptom.  The patient has never had amaurosis fugax or monocular blindness.  The patient has never had facial drooping or hemiplegia.  The patient has never had receptive or expressive aphasia.     The patient's risks factors for carotid disease include: HTN, HLD.  Past Medical History:  Diagnosis Date  . Anemia   . Hyperlipidemia   . Hypertension   . Skin cancer     Past Surgical History:  Procedure Laterality Date  . ABDOMINAL HYSTERECTOMY    . EYE SURGERY Left    Torn Retina  . KNEE SURGERY      Social History   Socioeconomic History  . Marital status: Married    Spouse name: Not on file  . Number of children: Not on file  . Years of education: Not on file  . Highest education level: Not on file  Occupational History  . Occupation: [employer]  Social Needs  . Financial resource strain: Not on file  . Food insecurity:    Worry: Not on file    Inability: Not on file  . Transportation needs:    Medical: Not on file    Non-medical: Not on file  Tobacco Use  . Smoking status: Never Smoker  . Smokeless tobacco: Never Used  Substance and Sexual Activity  . Alcohol use: Yes    Alcohol/week: 0.6 oz    Types: 1 Glasses of wine per week  . Drug use: No  . Sexual activity: Yes    Partners: Male  Lifestyle  . Physical activity:    Days per week: Not on file    Minutes per session: Not on file  . Stress: Not on  file  Relationships  . Social connections:    Talks on phone: Not on file    Gets together: Not on file    Attends religious service: Not on file    Active member of club or organization: Not on file    Attends meetings of clubs or organizations: Not on file    Relationship status: Not on file  . Intimate partner violence:    Fear of current or ex partner: Not on file    Emotionally abused: Not on file    Physically abused: Not on file    Forced sexual activity: Not on file  Other Topics Concern  . Not on file  Social History Narrative   Exercise-- 3 x a week for 1 hour    Family History  Problem Relation Age of Onset  . Alzheimer's disease Mother   . Hyperlipidemia Mother   . Hypertension Mother   . Coronary artery disease Paternal Grandfather   . Stroke Paternal Grandfather   . Hyperlipidemia Father   . Hypertension Father     Current Outpatient Medications  Medication Sig Dispense Refill  . aspirin 81 MG tablet Take 81 mg by mouth daily.    Marland Kitchen atorvastatin (LIPITOR) 40 MG tablet  Take 40 mg by mouth daily.    . Cholecalciferol (VITAMIN D3) 5000 UNITS TABS Take 1 tablet by mouth daily.     . Coenzyme Q10 (COQ-10) 200 MG CAPS Take 1 capsule by mouth daily.    Marland Kitchen estradiol (ESTRACE) 2 MG tablet TAKE 1 TABLET BY MOUTH  DAILY 90 tablet 1  . fenofibrate 54 MG tablet Take 1 tablet (54 mg total) by mouth daily. 90 tablet 1  . Ferrous Sulfate 27 MG TABS Take 1 tablet by mouth daily.    . Multiple Vitamin (MULTIVITAMIN) tablet Take 1 tablet by mouth daily.    . mupirocin cream (BACTROBAN) 2 % Apply 1 application topically 2 (two) times daily. 15 g 0  . niacin 500 MG tablet Take 2 tablets (1,000 mg total) by mouth daily with breakfast. 180 tablet 3  . Omega-3 Fatty Acids (FISH OIL) 1200 MG CAPS Take 1 capsule by mouth daily.     . SUMAtriptan (IMITREX) 50 MG tablet Take 1 tablet (50 mg total) by mouth every 2 (two) hours as needed for migraine. May repeat in 2 hours if headache  persists or recurs. 10 tablet 0  . vitamin E 400 UNIT capsule Take 400 Units by mouth daily.    . rosuvastatin (CRESTOR) 20 MG tablet Take 1 tablet (20 mg total) by mouth daily. (Patient not taking: Reported on 03/23/2018) 90 tablet 3   No current facility-administered medications for this visit.     No Known Allergies  REVIEW OF SYSTEMS (negative unless checked):   Cardiac:  []  Chest pain or chest pressure? []  Shortness of breath upon activity? []  Shortness of breath when lying flat? []  Irregular heart rhythm?  Vascular:  []  Pain in calf, thigh, or hip brought on by walking? []  Pain in feet at night that wakes you up from your sleep? []  Blood clot in your veins? []  Leg swelling?  Pulmonary:  []  Oxygen at home? []  Productive cough? []  Wheezing?  Neurologic:  []  Sudden weakness in arms or legs? []  Sudden numbness in arms or legs? []  Sudden onset of difficult speaking or slurred speech? []  Temporary loss of vision in one eye? []  Problems with dizziness?  Gastrointestinal:  []  Blood in stool? []  Vomited blood?  Genitourinary:  []  Burning when urinating? []  Blood in urine?  Psychiatric:  []  Major depression  Hematologic:  []  Bleeding problems? []  Problems with blood clotting?  Dermatologic:  []  Rashes or ulcers?  Constitutional:  []  Fever or chills?  Ear/Nose/Throat:  []  Change in hearing? []  Nose bleeds? []  Sore throat?  Musculoskeletal:  []  Back pain? []  Joint pain? []  Muscle pain?   For VQI Use Only   PRE-ADM LIVING Home  AMB STATUS Ambulatory  CAD Sx None  PRIOR CHF None  STRESS TEST No    Physical Examination     Vitals:   03/23/18 1246 03/23/18 1254 03/23/18 1256  BP: (!) 168/88 (!) 160/91 (!) 164/86  Pulse: 63 63 63  Resp: 16    Temp: 98.6 F (37 C)    TempSrc: Oral    SpO2: 99%    Weight: 150 lb (68 kg)    Height: 5\' 5"  (1.651 m)     Body mass index is 24.96 kg/m.  General Alert, O x 3, WD, NAD  Head [place]/AT,      Ear/Nose/ Throat Hearing grossly intact, nares without erythema or drainage, oropharynx without Erythema or Exudate, Mallampati score: 3,   Eyes PERRLA, EOMI,    Neck  Supple, mid-line trachea,    Pulmonary Sym exp, good B air movt, CTA B  Cardiac RRR, Nl S1, S2, no Murmurs, No rubs, No S3,S4  Vascular Vessel Right Left  Radial Palpable Palpable  Brachial Palpable Palpable  Carotid Palpable, No Bruit Palpable, No Bruit  Aorta Not palpable N/A  Femoral Palpable Palpable  Popliteal Not palpable Not palpable  PT Palpable Palpable  DP Palpable Palpable    Gastro- intestinal soft, non-distended, non-tender to palpation, No guarding or rebound, no HSM, no masses, no CVAT B, No palpable prominent aortic pulse,    Musculo- skeletal M/S 5/5 throughout  , Extremities without ischemic changes  , No edema present, No visible varicosities , No Lipodermatosclerosis present  Neurologic Cranial nerves 2-12 intact , Pain and light touch intact in extremities , Motor exam as listed above  Psychiatric Judgement intact, Mood & affect appropriate for pt's clinical situation  Dermatologic See M/S exam for extremity exam, No rashes otherwise noted  Lymphatic  Palpable lymph nodes: None     Non-invasive Vascular Imaging   B Carotid Duplex (03/23/2018):  Marland Kitchen R ICA stenosis:  1-39% proximally, but 40-59% in mid-segment, suggestive of FMD . R VA: patent and antegrade . L ICA stenosis:  1-39% proximally, but 40-59% in mid-segment, suggestive of FMD . L VA: patent and antegrade   Outside Studies/Documentation   10 pages of outside documents were reviewed including: outside documents including MRI, B carotid duplex.   Medical Decision Making   Janet Knox is a 62 y.o. female who presents with: atypical migraines, asx ICA stenosis 40-59%.   This patient's sx are not consistent with carotid disease.  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  I don't think its worth get a CTA Neck as I wouldn't operate on this patient anyway as she doesn't have sx carotid disease.  Based on the patient's vascular studies and examination, I have offered the patient: annual B carotid duplex. . I discussed in depth with the patient the nature of atherosclerosis, and emphasized the importance of maximal medical management including strict control of blood pressure, blood glucose, and lipid levels, obtaining regular exercise, antiplatelet agents, and cessation of smoking.    The patient is currently on a statin: Lipitor.   The patient is currently on an anti-platelet: ASA.  The patient is aware that without maximal medical management the underlying atherosclerotic disease process will progress, limiting the benefit of any interventions.  Thank you for allowing [place] to participate in this patient's care.   Adele Barthel, MD, FACS [facility] Office: [phone] Pager: [phone]  03/23/2018, 1:22 PM

## 2018-03-29 ENCOUNTER — Other Ambulatory Visit: Payer: Self-pay | Admitting: *Deleted

## 2018-03-29 MED ORDER — ATORVASTATIN CALCIUM 80 MG PO TABS: 80.0000 mg | ORAL_TABLET | Freq: Every day | ORAL | 1 refills | Status: DC

## 2018-03-30 ENCOUNTER — Ambulatory Visit: Payer: 59 | Admitting: Endocrinology

## 2018-04-01 ENCOUNTER — Ambulatory Visit: Payer: 59 | Admitting: Internal Medicine

## 2018-04-01 ENCOUNTER — Encounter: Payer: Self-pay | Admitting: Internal Medicine

## 2018-04-01 VITALS — BP 160/88 | HR 66 | Ht 65.0 in | Wt 153.8 lb

## 2018-04-01 DIAGNOSIS — E042 Nontoxic multinodular goiter: Secondary | ICD-10-CM

## 2018-04-01 NOTE — Patient Instructions (Signed)
Please stop downstairs to see if we can get the U/S today.  Please come back for a follow-up appointment in 1 year.   Thyroid Nodule A thyroid nodule is an isolatedgrowth of thyroid cells that forms a lump in your thyroid gland. The thyroid gland is a butterfly-shaped gland. It is found in the lower front of your neck. This gland sends chemical messengers (hormones) through your blood to all parts of your body. These hormones are important in regulating your body temperature and helping your body to use energy. Thyroid nodules are common. Most are not cancerous (are benign). You may have one nodule or several nodules. Different types of thyroid nodules include:  Nodules that grow and fill with fluid (thyroid cysts).  Nodules that produce too much thyroid hormone (hot nodules or hyperthyroid).  Nodules that produce no thyroid hormone (cold nodules or hypothyroid).  Nodules that form from cancer cells (thyroid cancers).  What are the causes? Usually, the cause of this condition is not known. What increases the risk? Factors that make this condition more likely to develop include:  Increasing age. Thyroid nodules become more common in people who are older than 63 years of age.  Gender. ? Benign thyroid nodules are more common in women. ? Cancerous (malignant) thyroid nodules are more common in men.  A family history that includes: ? Thyroid nodules. ? Pheochromocytoma. ? Thyroid carcinoma. ? Hyperparathyroidism.  Certain kinds of thyroid diseases, such as Hashimoto thyroiditis.  Lack of iodine.  A history of head and neck radiation, such as from X-rays.  What are the signs or symptoms? It is common for this condition to cause no symptoms. If you have symptoms, they may include:  A lump in your lower neck.  Feeling a lump or tickle in your throat.  Pain in your neck, jaw, or ear.  Having trouble swallowing.  Hot nodules may cause symptoms that include:  Weight  loss.  Warm, flushed skin.  Feeling hot.  Feeling nervous.  A racing heartbeat.  Cold nodules may cause symptoms that include:  Weight gain.  Dry skin.  Brittle hair. This may also occur with hair loss.  Feeling cold.  Fatigue.  Thyroid cancer nodules may cause symptoms that include:  Hard nodules that feel stuck to the thyroid gland.  Hoarseness.  Lumps in the glands near your thyroid (lymph nodes).  How is this diagnosed? A thyroid nodule may be felt by your health care provider during a physical exam. This condition may also be diagnosed based on your symptoms. You may also have tests, including:  An ultrasound. This may be done to confirm the diagnosis.  A biopsy. This involves taking a sample from the nodule and looking at it under a microscope to see if the nodule is benign.  Blood tests to make sure that your thyroid is working properly.  Imaging tests such as MRI or CT scan may be done if: ? Your nodule is large. ? Your nodule is blocking your airway. ? Cancer is suspected.  How is this treated? Treatment depends on the cause and size of your nodule or nodules. If the nodule is benign, treatment may not be necessary. Your health care provider may monitor the nodule to see if it goes away without treatment. If the nodule continues to grow, is cancerous, or does not go away:  It may need to be drained with a needle.  It may need to be removed with surgery.  If you have surgery, part or  all of your thyroid gland may need to be removed as well. Follow these instructions at home:  Pay attention to any changes in your nodule.  Take over-the-counter and prescription medicines only as told by your health care provider.  Keep all follow-up visits as told by your health care provider. This is important. Contact a health care provider if:  Your voice changes.  You have trouble swallowing.  You have pain in your neck, ear, or jaw that is getting  worse.  Your nodule gets bigger.  Your nodule starts to make it harder for you to breathe. Get help right away if:  You have a sudden fever.  You feel very weak.  Your muscles look like they are shrinking (muscle wasting).  You have mood swings.  You feel very restless.  You feel confused.  You are seeing or hearing things that other people do not see or hear (having hallucinations).  You feel suddenly nauseous or throw up.  You suddenly have diarrhea.  You have chest pain.  There is a loss of consciousness. This information is not intended to replace advice given to you by your health care provider. Make sure you discuss any questions you have with your health care provider. Document Released: 08/07/2004 Document Revised: 05/17/2016 Document Reviewed: 12/26/2014 Elsevier Interactive Patient Education  2018 [employer].

## 2018-04-01 NOTE — Progress Notes (Signed)
Patient ID: Janet Knox, female   DOB: [birth year], 62 y.o.   MRN: [id number]    HPI  Janet Knox is a 62 y.o.-year-old female, referred by her PCP, Dr. Carollee Herter, for evaluation for thyroid nodules.  Her husband accompanies her today.  Patient's right thyroid nodules were found incidentally on carotid ultrasound from 01/25/2018:  Right thyroid lobe: 1.5 cm complex cystic nodule  0.8 cm simple cyst Left thyroid lobe:  0.5 cm solid nodule  The above ultrasound was checked when patient had a severe migraine episode.  This showed decreased carotid flow.  She had a repeat carotid ultrasound in 03/23/2018 that showed normalization of the flow.   She did not have a thyroid ultrasound checked yet.  Pt denies: - feeling nodules in neck - hoarseness - Dysphagia - choking - SOB with lying down She occasionally feels a lump in her throat, but not consistently.  I reviewed pt's thyroid tests: Lab Results  Component Value Date   TSH 3.26 01/13/2018   TSH 2.80 01/05/2017   TSH 3.13 12/31/2015   TSH 2.77 12/20/2014   TSH 2.40 03/22/2013   TSH 1.48 01/27/2010   TSH 1.50 01/29/2009    Pt denies: - fatigue - heat intolerance/cold intolerance - tremors - palpitations - anxiety/depression - hyperdefecation/constipation - weight loss/weight gain - dry skin - hair loss  No FH of thyroid ds. No FH of thyroid cancer. No h/o radiation tx to head or neck.  No recent contrast studies. No steroid use. No herbal supplements.   Pt also has a history of HL, HTN, migraines, hysterectomy.  She is exercising consistently 3 times a week, by walking on the treadmill and riding an exercise bike.  ROS: Constitutional: no weight gain/loss, no fatigue, no subjective hyperthermia/hypothermia Eyes: no blurry vision, no xerophthalmia ENT: no sore throat,  + see HPI Cardiovascular: no CP/SOB/palpitations/leg swelling Respiratory: no cough/SOB Gastrointestinal: no N/V/D/C Musculoskeletal: no  muscle/joint aches Skin: no rashes Neurological: no tremors/numbness/tingling/dizziness Psychiatric: no depression/anxiety  Past Medical History:  Diagnosis Date  . Anemia   . Hyperlipidemia   . Hypertension   . Skin cancer    Past Surgical History:  Procedure Laterality Date  . ABDOMINAL HYSTERECTOMY    . EYE SURGERY Left    Torn Retina  . KNEE SURGERY     Social History   Socioeconomic History  . Marital status: Married    Spouse name: Not on file  . Number of children: 1  . Years of education: Not on file  . Highest education level: Not on file  Occupational History  . Occupation: [employer]  Tobacco Use  . Smoking status: Never Smoker  . Smokeless tobacco: Never Used  Substance and Sexual Activity  . Alcohol use: Yes    Alcohol/week: 0.6 oz    Types: 1 Glasses of wine per week  . Drug use: No  . Sexual activity: Yes    Partners: Male  Lifestyle  . Physical activity:    Days per week: Not on file    Minutes per session: Not on file  Relationships  Social History Narrative   Exercise-- 3 x a week for 1 hour   Current Outpatient Medications on File Prior to Visit  Medication Sig Dispense Refill  . aspirin 81 MG tablet Take 81 mg by mouth daily.    Marland Kitchen atorvastatin (LIPITOR) 80 MG tablet Take 1 tablet (80 mg total) by mouth daily. 90 tablet 1  . Cholecalciferol (VITAMIN D3) 5000 UNITS TABS  Take 1 tablet by mouth daily.     . Coenzyme Q10 (COQ-10) 200 MG CAPS Take 1 capsule by mouth daily.    Marland Kitchen estradiol (ESTRACE) 2 MG tablet TAKE 1 TABLET BY MOUTH  DAILY 90 tablet 1  . fenofibrate 54 MG tablet Take 1 tablet (54 mg total) by mouth daily. 90 tablet 1  . Ferrous Sulfate 27 MG TABS Take 1 tablet by mouth daily.    . Multiple Vitamin (MULTIVITAMIN) tablet Take 1 tablet by mouth daily.    . mupirocin cream (BACTROBAN) 2 % Apply 1 application topically 2 (two) times daily. 15 g 0  . niacin 500 MG tablet Take 2 tablets (1,000 mg total) by mouth daily with  breakfast. 180 tablet 3  . Omega-3 Fatty Acids (FISH OIL) 1200 MG CAPS Take 1 capsule by mouth daily.     . SUMAtriptan (IMITREX) 50 MG tablet Take 1 tablet (50 mg total) by mouth every 2 (two) hours as needed for migraine. May repeat in 2 hours if headache persists or recurs. 10 tablet 0  . vitamin E 400 UNIT capsule Take 400 Units by mouth daily.     No current facility-administered medications on file prior to visit.    No Known Allergies Family History  Problem Relation Age of Onset  . Alzheimer's disease Mother   . Hyperlipidemia Mother   . Hypertension Mother   . Coronary artery disease Paternal Grandfather   . Stroke Paternal Grandfather   . Hyperlipidemia Father   . Hypertension Father    Mother with basal cell carcinoma and brother with melanoma.  PE: BP (!) 160/88   Pulse 66   Ht 5\' 5"  (1.651 m)   Wt 153 lb 12.8 oz (69.8 kg)   SpO2 98%   BMI 25.59 kg/m  Wt Readings from Last 3 Encounters:  04/01/18 153 lb 12.8 oz (69.8 kg)  03/23/18 150 lb (68 kg)  02/04/18 153 lb 12.8 oz (69.8 kg)    Constitutional: overweight, in NAD Eyes: PERRLA, EOMI, no exophthalmos ENT: moist mucous membranes, no thyromegaly, no cervical lymphadenopathy Cardiovascular: RRR, No MRG Respiratory: CTA B Gastrointestinal: abdomen soft, NT, ND, BS+ Musculoskeletal: no deformities, strength intact in all 4;  Skin: moist, warm, no rashes Neurological: no tremor with outstretched hands, DTR normal in all 4  ASSESSMENT: 1. Multiple thyroid nodules  PLAN: 1. Multiple thyroid nodules - I reviewed the report of her carotid ultrasound along with the patient and her husband. I pointed out that the dominant nodules are small.  2 of the nodules (right lobe) are cystic and 1 of them is solid (left lobe), but very small, and not worrisome.  However, she will need to have a dedicated thyroid ultrasound for better characterization of the nodules.  I explained that size is one of the features that we  usually look at and a larger than nodule the larger the risk of cancer.  However, we also follow other possible risk factors:  - Hypoechogenicity - microcalcifications - internal blood flow - more told and wide - Not well delimited from surrounding tissue - We will order a thyroid ultrasound and I will check whether the nodules contain the above features;  if they do, there is a possibility that she may need an FNA  to rule out cancer.  I explained that thyroid cancer is usually a very indolent one, which does not usually reduce her life expectancy or her quality of life. - Pt does not have a thyroid cancer  family history or a personal history of RxTx to head/neck. All these would favor benignity.  - I explained that this is not cancer, depending on the ultrasound characteristics of the nodule, we can continue to follow her on a yearly or every 2-year basis, and check another ultrasound in another year or 2. - she should let me know if she develops neck compression symptoms - We reviewed together her previous TSH level from earlier this year and this was normal. - RTC in 1 year   Thyroid U/S (04/07/2018): EXAM: THYROID ULTRASOUND  TECHNIQUE: Ultrasound examination of the thyroid gland and adjacent soft tissues was performed.  COMPARISON: None.  FINDINGS: Parenchymal Echotexture: Mildly heterogenous Isthmus: 0.1 cm Right lobe: 3.9 x 1.4 x 1.2 cm Left lobe: 3.0 x 1.1 x 0.8 cm _______________________________________________________  Nodule # 1: Location: Right; Mid Maximum size: 1.5 cm; Other 2 dimensions: 0.8 x 0.9 cm Composition: mixed cystic and solid (1) Echogenicity: hypoechoic (2) *Given size (>/= 1.5 - 2.4 cm) and appearance, a follow-up ultrasound in 1 year should be considered based on TI-RADS criteria. __________________________________________  Other nodules measure 0.7 cm or less and do not meet criteria for biopsy nor follow-up.  IMPRESSION: Right mid nodule 1  meets criteria for annual follow-up.    Philemon Kingdom, MD PhD [facility] Endocrinology

## 2018-04-06 ENCOUNTER — Ambulatory Visit
Admission: RE | Admit: 2018-04-06 | Discharge: 2018-04-06 | Disposition: A | Discharge status: Home / Self Care | Payer: 59 | Source: Ambulatory Visit | Attending: Internal Medicine | Admitting: Internal Medicine

## 2018-04-06 DIAGNOSIS — E041 Nontoxic single thyroid nodule: Secondary | ICD-10-CM | POA: Diagnosis not present

## 2018-05-10 DIAGNOSIS — D1801 Hemangioma of skin and subcutaneous tissue: Secondary | ICD-10-CM | POA: Diagnosis not present

## 2018-05-10 DIAGNOSIS — D485 Neoplasm of uncertain behavior of skin: Secondary | ICD-10-CM | POA: Diagnosis not present

## 2018-05-10 DIAGNOSIS — C4441 Basal cell carcinoma of skin of scalp and neck: Secondary | ICD-10-CM | POA: Diagnosis not present

## 2018-05-10 DIAGNOSIS — C44519 Basal cell carcinoma of skin of other part of trunk: Secondary | ICD-10-CM | POA: Diagnosis not present

## 2018-05-10 DIAGNOSIS — L821 Other seborrheic keratosis: Secondary | ICD-10-CM | POA: Diagnosis not present

## 2018-05-10 DIAGNOSIS — D2272 Melanocytic nevi of left lower limb, including hip: Secondary | ICD-10-CM | POA: Diagnosis not present

## 2018-05-10 DIAGNOSIS — D229 Melanocytic nevi, unspecified: Secondary | ICD-10-CM | POA: Diagnosis not present

## 2018-05-21 ENCOUNTER — Other Ambulatory Visit: Payer: Self-pay | Admitting: Family Medicine

## 2018-05-21 DIAGNOSIS — Z780 Asymptomatic menopausal state: Secondary | ICD-10-CM

## 2018-05-21 DIAGNOSIS — Z0000 Encounter for general adult medical examination without abnormal findings: Secondary | ICD-10-CM

## 2018-05-23 NOTE — Telephone Encounter (Signed)
Metoprolol not on active med list; routed to Dr. Etter Sjogren to review.  Estrace rx request refused, as pt. Should have enough until October. Last refilled 01/13/18 #90, 1 RF

## 2018-05-24 NOTE — Telephone Encounter (Signed)
Please call pt and ask her about this

## 2018-06-07 ENCOUNTER — Other Ambulatory Visit: Payer: Self-pay | Admitting: Family Medicine

## 2018-06-07 DIAGNOSIS — Z0000 Encounter for general adult medical examination without abnormal findings: Secondary | ICD-10-CM

## 2018-06-09 ENCOUNTER — Telehealth: Payer: Self-pay

## 2018-06-09 NOTE — Telephone Encounter (Signed)
Refills placed by Dr. Etter Sjogren 8/27 for both medications.

## 2018-06-09 NOTE — Telephone Encounter (Signed)
Estradiol and metoprolol added to med list by Dr. Etter Sjogren, refills placed 05/24/18.

## 2018-06-22 DIAGNOSIS — Z6825 Body mass index (BMI) 25.0-25.9, adult: Secondary | ICD-10-CM | POA: Diagnosis not present

## 2018-06-22 DIAGNOSIS — Z01419 Encounter for gynecological examination (general) (routine) without abnormal findings: Secondary | ICD-10-CM | POA: Diagnosis not present

## 2018-06-22 LAB — HM MAMMOGRAPHY

## 2018-06-22 LAB — HM PAP SMEAR: HM Pap smear: NEGATIVE

## 2018-07-12 ENCOUNTER — Telehealth: Payer: Self-pay | Admitting: *Deleted

## 2018-07-12 NOTE — Telephone Encounter (Signed)
Received Medical records from [facility]; forwarded to provider/SLS 10/15

## 2018-07-13 DIAGNOSIS — D485 Neoplasm of uncertain behavior of skin: Secondary | ICD-10-CM | POA: Diagnosis not present

## 2018-07-13 DIAGNOSIS — C4441 Basal cell carcinoma of skin of scalp and neck: Secondary | ICD-10-CM | POA: Diagnosis not present

## 2018-07-13 DIAGNOSIS — L989 Disorder of the skin and subcutaneous tissue, unspecified: Secondary | ICD-10-CM | POA: Diagnosis not present

## 2018-07-13 DIAGNOSIS — C44319 Basal cell carcinoma of skin of other parts of face: Secondary | ICD-10-CM | POA: Diagnosis not present

## 2018-07-22 ENCOUNTER — Ambulatory Visit: Payer: 59 | Admitting: Family Medicine

## 2018-07-26 ENCOUNTER — Encounter: Payer: Self-pay | Admitting: Family Medicine

## 2018-07-26 DIAGNOSIS — C44519 Basal cell carcinoma of skin of other part of trunk: Secondary | ICD-10-CM | POA: Diagnosis not present

## 2018-08-05 ENCOUNTER — Ambulatory Visit: Payer: 59 | Admitting: Family Medicine

## 2018-08-05 ENCOUNTER — Encounter: Payer: Self-pay | Admitting: Family Medicine

## 2018-08-05 VITALS — BP 134/69 | HR 64 | Temp 98.0°F | Resp 16 | Ht 65.0 in | Wt 153.0 lb

## 2018-08-05 DIAGNOSIS — Z780 Asymptomatic menopausal state: Secondary | ICD-10-CM | POA: Diagnosis not present

## 2018-08-05 DIAGNOSIS — E041 Nontoxic single thyroid nodule: Secondary | ICD-10-CM | POA: Diagnosis not present

## 2018-08-05 DIAGNOSIS — Z23 Encounter for immunization: Secondary | ICD-10-CM | POA: Diagnosis not present

## 2018-08-05 DIAGNOSIS — E785 Hyperlipidemia, unspecified: Secondary | ICD-10-CM

## 2018-08-05 DIAGNOSIS — Z0000 Encounter for general adult medical examination without abnormal findings: Secondary | ICD-10-CM

## 2018-08-05 DIAGNOSIS — I10 Essential (primary) hypertension: Secondary | ICD-10-CM

## 2018-08-05 MED ORDER — ESTRADIOL 2 MG PO TABS: 2.0000 mg | ORAL_TABLET | Freq: Every day | ORAL | 1 refills | Status: DC

## 2018-08-05 MED ORDER — ATORVASTATIN CALCIUM 40 MG PO TABS: 40.0000 mg | ORAL_TABLET | Freq: Every day | ORAL | 3 refills | Status: DC

## 2018-08-05 MED ORDER — FENOFIBRATE 54 MG PO TABS: 54.0000 mg | ORAL_TABLET | Freq: Every day | ORAL | 1 refills | Status: DC

## 2018-08-05 MED ORDER — METOPROLOL TARTRATE 50 MG PO TABS: 50.0000 mg | ORAL_TABLET | Freq: Two times a day (BID) | ORAL | 1 refills | Status: DC

## 2018-08-05 NOTE — Assessment & Plan Note (Signed)
Well controlled, no changes to meds. Encouraged heart healthy diet such as the DASH diet and exercise as tolerated.  °

## 2018-08-05 NOTE — Patient Instructions (Signed)

## 2018-08-05 NOTE — Assessment & Plan Note (Signed)
Tolerating statin, encouraged heart healthy diet, avoid trans fats, minimize simple carbs and saturated fats. Increase exercise as tolerated--- con't fenofibrate as well Check labs

## 2018-08-05 NOTE — Progress Notes (Signed)
Patient ID: Janet Knox, female    DOB: 05/17/1956  Age: 62 y.o. MRN: [id number]    Subjective:  Subjective  HPI Janet Knox presents for f/u bp and cholesterol--- pt taking 40 mg lipitor due to carotid stenosis not being as bad and they thought.    Review of Systems  Constitutional: Negative for appetite change, diaphoresis, fatigue and unexpected weight change.  Eyes: Negative for pain, redness and visual disturbance.  Respiratory: Negative for cough, chest tightness, shortness of breath and wheezing.   Cardiovascular: Negative for chest pain, palpitations and leg swelling.  Endocrine: Negative for cold intolerance, heat intolerance, polydipsia, polyphagia and polyuria.  Genitourinary: Negative for difficulty urinating, dysuria and frequency.  Neurological: Negative for dizziness, light-headedness, numbness and headaches.    History Past Medical History:  Diagnosis Date  . Anemia   . Hyperlipidemia   . Hypertension   . Skin cancer     She has a past surgical history that includes Abdominal hysterectomy; Knee surgery; and Eye surgery (Left).   Her family history includes Alzheimer's disease in her mother; Coronary artery disease in her paternal grandfather; Hyperlipidemia in her father and mother; Hypertension in her father and mother; Stroke in her paternal grandfather.She reports that she has never smoked. She has never used smokeless tobacco. She reports that she drinks about 1.0 standard drinks of alcohol per week. She reports that she does not use drugs.  Current Outpatient Medications on File Prior to Visit  Medication Sig Dispense Refill  . aspirin 81 MG tablet Take 81 mg by mouth daily.    . Cholecalciferol (VITAMIN D3) 5000 UNITS TABS Take 1 tablet by mouth daily.     . Coenzyme Q10 (COQ-10) 200 MG CAPS Take 1 capsule by mouth daily.    . Ferrous Sulfate 27 MG TABS Take 1 tablet by mouth daily.    . Multiple Vitamin (MULTIVITAMIN) tablet Take 1 tablet by mouth  daily.    . mupirocin cream (BACTROBAN) 2 % Apply 1 application topically 2 (two) times daily. 15 g 0  . niacin 500 MG tablet Take 2 tablets (1,000 mg total) by mouth daily with breakfast. 180 tablet 3  . Omega-3 Fatty Acids (FISH OIL) 1200 MG CAPS Take 1 capsule by mouth daily.     . SUMAtriptan (IMITREX) 50 MG tablet Take 1 tablet (50 mg total) by mouth every 2 (two) hours as needed for migraine. May repeat in 2 hours if headache persists or recurs. 10 tablet 0  . vitamin E 400 UNIT capsule Take 400 Units by mouth daily.     No current facility-administered medications on file prior to visit.      Objective:  Objective  Physical Exam  Constitutional: She is oriented to person, place, and time. She appears well-developed and well-nourished.  HENT:  Head: Normocephalic and atraumatic.  Eyes: Conjunctivae and EOM are normal.  Neck: Normal range of motion. Neck supple. No JVD present. Carotid bruit is not present. No thyromegaly present.  Cardiovascular: Normal rate, regular rhythm and normal heart sounds.  No murmur heard. Pulmonary/Chest: Effort normal and breath sounds normal. No respiratory distress. She has no wheezes. She has no rales. She exhibits no tenderness.  Musculoskeletal: She exhibits no edema.  Neurological: She is alert and oriented to person, place, and time.  Psychiatric: She has a normal mood and affect.  Nursing note and vitals reviewed.  BP 134/69 (BP Location: Left Arm, Patient Position: Sitting, Cuff Size: Small)   Pulse 64  Temp 98 F (36.7 C) (Oral)   Resp 16   Ht 5\' 5"  (1.651 m)   Wt 153 lb (69.4 kg)   SpO2 100%   BMI 25.46 kg/m  Wt Readings from Last 3 Encounters:  08/05/18 153 lb (69.4 kg)  04/01/18 153 lb 12.8 oz (69.8 kg)  03/23/18 150 lb (68 kg)     Lab Results  Component Value Date   WBC 6.3 01/13/2018   HGB 12.2 01/13/2018   HCT 36.6 01/13/2018   PLT 271.0 01/13/2018   GLUCOSE 81 01/13/2018   CHOL 167 01/13/2018   TRIG 108.0  01/13/2018   HDL 65.30 01/13/2018   LDLDIRECT 117.5 01/27/2010   LDLCALC 80 01/13/2018   ALT 18 01/13/2018   AST 25 01/13/2018   NA 138 01/13/2018   K 4.1 01/13/2018   CL 104 01/13/2018   CREATININE 0.67 01/13/2018   BUN 19 01/13/2018   CO2 27 01/13/2018   TSH 3.26 01/13/2018   MICROALBUR 0.7 03/22/2013    US Thyroid  Result Date: 04/07/2018 CLINICAL DATA:  Incidental on [place]. Noted on carotid ultrasound study. EXAM: THYROID ULTRASOUND TECHNIQUE: Ultrasound examination of the thyroid gland and adjacent soft tissues was performed. COMPARISON:  None. FINDINGS: Parenchymal Echotexture: Mildly heterogenous Isthmus: 0.1 cm Right lobe: 3.9 x 1.4 x 1.2 cm Left lobe: 3.0 x 1.1 x 0.8 cm _________________________________________________________ Estimated total number of nodules >/= 1 cm: 1 Number of spongiform nodules >/=  2 cm not described below (TR1): 0 Number of mixed cystic and solid nodules >/= 1.5 cm not described below (TR2): 0 _________________________________________________________ Nodule # 1: Location: Right; Mid Maximum size: 1.5 cm; Other 2 dimensions: 0.8 x 0.9 cm Composition: mixed cystic and solid (1) Echogenicity: hypoechoic (2) Shape: not taller-than-wide (0) Margins: smooth (0) Echogenic foci: none (0) ACR TI-RADS total points: 3. ACR TI-RADS risk category: TR3 (3 points). ACR TI-RADS recommendations: *Given size (>/= 1.5 - 2.4 cm) and appearance, a follow-up ultrasound in 1 year should be considered based on TI-RADS criteria. _________________________________________________________ Other nodules measure 0.7 cm or less and do not meet criteria for biopsy nor follow-up. IMPRESSION: Right mid nodule 1 meets criteria for annual follow-up. The above is in keeping with the ACR TI-RADS recommendations - J Am Coll Radiol 2017;14:587-595. Electronically Signed   By: Marybelle Killings M.D.   On: 04/07/2018 09:16     Assessment & Plan:  Plan  I have discontinued Janet Knox's atorvastatin. I have  also changed her fenofibrate, metoprolol tartrate, and estradiol. Additionally, I am having her start on atorvastatin. Lastly, I am having her maintain her vitamin E, multivitamin, aspirin, CoQ-10, Fish Oil, Vitamin D3, niacin, Ferrous Sulfate, mupirocin cream, and SUMAtriptan.  Meds ordered this encounter  Medications  . atorvastatin (LIPITOR) 40 MG tablet    Sig: Take 1 tablet (40 mg total) by mouth daily.    Dispense:  90 tablet    Refill:  3  . fenofibrate 54 MG tablet    Sig: Take 1 tablet (54 mg total) by mouth daily.    Dispense:  90 tablet    Refill:  1  . metoprolol tartrate (LOPRESSOR) 50 MG tablet    Sig: Take 1 tablet (50 mg total) by mouth 2 (two) times daily.    Dispense:  180 tablet    Refill:  1  . estradiol (ESTRACE) 2 MG tablet    Sig: Take 1 tablet (2 mg total) by mouth daily.    Dispense:  90 tablet  Refill:  1    Problem List Items Addressed This Visit      Unprioritized   Essential hypertension    Well controlled, no changes to meds. Encouraged heart healthy diet such as the DASH diet and exercise as tolerated.       Relevant Medications   atorvastatin (LIPITOR) 40 MG tablet   fenofibrate 54 MG tablet   metoprolol tartrate (LOPRESSOR) 50 MG tablet   Hyperlipidemia LDL goal <70    Tolerating statin, encouraged heart healthy diet, avoid trans fats, minimize simple carbs and saturated fats. Increase exercise as tolerated--- con't fenofibrate as well Check labs       Relevant Medications   atorvastatin (LIPITOR) 40 MG tablet   fenofibrate 54 MG tablet   metoprolol tartrate (LOPRESSOR) 50 MG tablet   Preventative health care   Relevant Medications   fenofibrate 54 MG tablet   estradiol (ESTRACE) 2 MG tablet    Other Visit Diagnoses    Hyperlipidemia, unspecified hyperlipidemia type    -  Primary   Relevant Medications   atorvastatin (LIPITOR) 40 MG tablet   fenofibrate 54 MG tablet   metoprolol tartrate (LOPRESSOR) 50 MG tablet   Needs flu shot        Relevant Orders   Flu Vaccine QUAD 36+ mos IM (Fluarix & Fluzone Quad PF (Completed)   Menopause       Relevant Medications   estradiol (ESTRACE) 2 MG tablet   Thyroid nodule       Relevant Medications   metoprolol tartrate (LOPRESSOR) 50 MG tablet   Other Relevant Orders   Thyroid Panel With TSH      Follow-up: Return in about 6 months (around 02/03/2019), or if symptoms worsen or fail to improve.  Ann Held, DO

## 2018-08-06 LAB — THYROID PANEL WITH TSH
Free Thyroxine Index: 2.2 (ref 1.4–3.8)
T3 UPTAKE: 21 % — AB (ref 22–35)
T4 TOTAL: 10.5 ug/dL (ref 5.1–11.9)
TSH: 3.38 m[IU]/L (ref 0.40–4.50)

## 2018-08-16 ENCOUNTER — Other Ambulatory Visit: Payer: Self-pay | Admitting: Family Medicine

## 2018-08-18 ENCOUNTER — Other Ambulatory Visit: Payer: Self-pay | Admitting: Family Medicine

## 2018-11-23 DIAGNOSIS — D485 Neoplasm of uncertain behavior of skin: Secondary | ICD-10-CM | POA: Diagnosis not present

## 2018-11-23 DIAGNOSIS — L821 Other seborrheic keratosis: Secondary | ICD-10-CM | POA: Diagnosis not present

## 2018-11-23 DIAGNOSIS — L570 Actinic keratosis: Secondary | ICD-10-CM | POA: Diagnosis not present

## 2018-11-23 DIAGNOSIS — D225 Melanocytic nevi of trunk: Secondary | ICD-10-CM | POA: Diagnosis not present

## 2018-11-23 DIAGNOSIS — L820 Inflamed seborrheic keratosis: Secondary | ICD-10-CM | POA: Diagnosis not present

## 2018-11-23 DIAGNOSIS — C44519 Basal cell carcinoma of skin of other part of trunk: Secondary | ICD-10-CM | POA: Diagnosis not present

## 2018-11-23 DIAGNOSIS — Z86018 Personal history of other benign neoplasm: Secondary | ICD-10-CM | POA: Diagnosis not present

## 2018-12-12 DIAGNOSIS — C4491 Basal cell carcinoma of skin, unspecified: Secondary | ICD-10-CM | POA: Diagnosis not present

## 2019-01-23 ENCOUNTER — Other Ambulatory Visit: Payer: Self-pay | Admitting: Family Medicine

## 2019-01-23 DIAGNOSIS — Z780 Asymptomatic menopausal state: Secondary | ICD-10-CM

## 2019-01-23 DIAGNOSIS — I10 Essential (primary) hypertension: Secondary | ICD-10-CM

## 2019-01-23 DIAGNOSIS — Z0000 Encounter for general adult medical examination without abnormal findings: Secondary | ICD-10-CM

## 2019-02-17 ENCOUNTER — Ambulatory Visit (INDEPENDENT_AMBULATORY_CARE_PROVIDER_SITE_OTHER): Payer: 59 | Admitting: Family Medicine

## 2019-02-17 ENCOUNTER — Telehealth: Payer: Self-pay | Admitting: Family Medicine

## 2019-02-17 ENCOUNTER — Encounter: Payer: Self-pay | Admitting: Family Medicine

## 2019-02-17 ENCOUNTER — Other Ambulatory Visit: Payer: Self-pay

## 2019-02-17 DIAGNOSIS — E041 Nontoxic single thyroid nodule: Secondary | ICD-10-CM

## 2019-02-17 DIAGNOSIS — D649 Anemia, unspecified: Secondary | ICD-10-CM

## 2019-02-17 DIAGNOSIS — E785 Hyperlipidemia, unspecified: Secondary | ICD-10-CM | POA: Diagnosis not present

## 2019-02-17 DIAGNOSIS — I10 Essential (primary) hypertension: Secondary | ICD-10-CM | POA: Diagnosis not present

## 2019-02-17 NOTE — Telephone Encounter (Signed)
LVM for pt to schedule lab appt in the following wk and 6 month fu appt.

## 2019-02-17 NOTE — Progress Notes (Signed)
Virtual Visit via Video Note  I connected with Janet Knox on 02/17/19 at  8:15 AM EDT by a video enabled telemedicine application and verified that I am speaking with the correct person using two identifiers.  Location: Patient: in car with husband -pt is passenger  Provider: office    I discussed the limitations of evaluation and management by telemedicine and the availability of in person appointments. The patient expressed understanding and agreed to proceed.  History of Present Illness: Pt is in her car.  No complaints. She needs labs and f/u cho and bp.    Past Medical History:  Diagnosis Date  . Anemia   . Hyperlipidemia   . Hypertension   . Skin cancer    Current Outpatient Medications on File Prior to Visit  Medication Sig Dispense Refill  . aspirin 81 MG tablet Take 81 mg by mouth daily.    Marland Kitchen atorvastatin (LIPITOR) 40 MG tablet Take 1 tablet (40 mg total) by mouth daily. 90 tablet 3  . Cholecalciferol (VITAMIN D3) 5000 UNITS TABS Take 1 tablet by mouth daily.     . Coenzyme Q10 (COQ-10) 200 MG CAPS Take 1 capsule by mouth daily.    Marland Kitchen estradiol (ESTRACE) 2 MG tablet TAKE 1 TABLET BY MOUTH  DAILY 90 tablet 0  . fenofibrate 54 MG tablet TAKE 1 TABLET BY MOUTH  DAILY 90 tablet 0  . Ferrous Sulfate 27 MG TABS Take 1 tablet by mouth daily.    . metoprolol tartrate (LOPRESSOR) 50 MG tablet TAKE 1 TABLET BY MOUTH TWO  TIMES DAILY 180 tablet 0  . Multiple Vitamin (MULTIVITAMIN) tablet Take 1 tablet by mouth daily.    . niacin 500 MG tablet Take 2 tablets (1,000 mg total) by mouth daily with breakfast. 180 tablet 3  . Omega-3 Fatty Acids (FISH OIL) 1200 MG CAPS Take 1 capsule by mouth daily.     . SUMAtriptan (IMITREX) 50 MG tablet Take 1 tablet (50 mg total) by mouth every 2 (two) hours as needed for migraine. May repeat in 2 hours if headache persists or recurs. 10 tablet 0  . vitamin E 400 UNIT capsule Take 400 Units by mouth daily.     No current facility-administered  medications on file prior to visit.      Observations/Objective: .120/79  Afebrile  Pt in NAD  Assessment and Plan: 1. Anemia, unspecified type Check labs  - CBC with Differential/Platelet; Future - IBC + Ferritin; Future  2. Hyperlipidemia, unspecified hyperlipidemia type Encouraged heart healthy diet, increase exercise, avoid trans fats, consider a krill oil cap daily  - Lipid panel; Future - Comprehensive metabolic panel; Future  3. Hypertension, unspecified type Well controlled, no changes to meds. Encouraged heart healthy diet such as the DASH diet and exercise as tolerated.   - Comprehensive metabolic panel; Future  4. Thyroid nodule Check labs  - TSH; Future   Follow Up Instructions:    I discussed the assessment and treatment plan with the patient. The patient was provided an opportunity to ask questions and all were answered. The patient agreed with the plan and demonstrated an understanding of the instructions.   The patient was advised to call back or seek an in-person evaluation if the symptoms worsen or if the condition fails to improve as anticipated.  I provided 15 minutes of non-face-to-face time during this encounter.   Ann Held, DO

## 2019-03-06 ENCOUNTER — Other Ambulatory Visit (INDEPENDENT_AMBULATORY_CARE_PROVIDER_SITE_OTHER): Payer: 59

## 2019-03-06 ENCOUNTER — Other Ambulatory Visit: Payer: Self-pay

## 2019-03-06 DIAGNOSIS — E785 Hyperlipidemia, unspecified: Secondary | ICD-10-CM

## 2019-03-06 DIAGNOSIS — D649 Anemia, unspecified: Secondary | ICD-10-CM | POA: Diagnosis not present

## 2019-03-06 DIAGNOSIS — I10 Essential (primary) hypertension: Secondary | ICD-10-CM

## 2019-03-06 DIAGNOSIS — E041 Nontoxic single thyroid nodule: Secondary | ICD-10-CM

## 2019-03-06 LAB — COMPREHENSIVE METABOLIC PANEL
ALT: 12 U/L (ref 0–35)
AST: 19 U/L (ref 0–37)
Albumin: 3.8 g/dL (ref 3.5–5.2)
Alkaline Phosphatase: 45 U/L (ref 39–117)
BUN: 22 mg/dL (ref 6–23)
CO2: 27 mEq/L (ref 19–32)
Calcium: 8.8 mg/dL (ref 8.4–10.5)
Chloride: 106 mEq/L (ref 96–112)
Creatinine, Ser: 0.7 mg/dL (ref 0.40–1.20)
GFR: 84.34 mL/min (ref >60.00)
Glucose, Bld: 87 mg/dL (ref 70–99)
Potassium: 4.8 mEq/L (ref 3.5–5.1)
Sodium: 139 mEq/L (ref 135–145)
Total Bilirubin: 0.5 mg/dL (ref 0.2–1.2)
Total Protein: 6.3 g/dL (ref 6.0–8.3)

## 2019-03-06 LAB — LIPID PANEL
Cholesterol: 154 mg/dL (ref 0–200)
HDL: 58.5 mg/dL (ref >39.00)
LDL Cholesterol: 72 mg/dL (ref 0–99)
NonHDL: 95.68
Total CHOL/HDL Ratio: 3
Triglycerides: 116 mg/dL (ref 0.0–149.0)
VLDL: 23.2 mg/dL (ref 0.0–40.0)

## 2019-03-06 LAB — CBC WITH DIFFERENTIAL/PLATELET
Basophils Absolute: 0 10*3/uL (ref 0.0–0.1)
Basophils Relative: 0.7 % (ref 0.0–3.0)
Eosinophils Absolute: 0.1 10*3/uL (ref 0.0–0.7)
Eosinophils Relative: 3.4 % (ref 0.0–5.0)
HCT: 36.3 % (ref 36.0–46.0)
Hemoglobin: 11.9 g/dL — ABNORMAL LOW (ref 12.0–15.0)
Lymphocytes Relative: 31.5 % (ref 12.0–46.0)
Lymphs Abs: 1.3 10*3/uL (ref 0.7–4.0)
MCHC: 32.9 g/dL (ref 30.0–36.0)
MCV: 95.5 fl (ref 78.0–100.0)
Monocytes Absolute: 0.3 10*3/uL (ref 0.1–1.0)
Monocytes Relative: 7.1 % (ref 3.0–12.0)
Neutro Abs: 2.4 10*3/uL (ref 1.4–7.7)
Neutrophils Relative %: 57.3 % (ref 43.0–77.0)
Platelets: 236 10*3/uL (ref 150.0–400.0)
RBC: 3.8 Mil/uL — ABNORMAL LOW (ref 3.87–5.11)
RDW: 13.6 % (ref 11.5–15.5)
WBC: 4.2 10*3/uL (ref 4.0–10.5)

## 2019-03-06 LAB — IBC + FERRITIN
Ferritin: 39.9 ng/mL (ref 10.0–291.0)
Iron: 100 ug/dL (ref 42–145)
Saturation Ratios: 20.9 % (ref 20.0–50.0)
Transferrin: 341 mg/dL (ref 212.0–360.0)

## 2019-03-06 LAB — TSH: TSH: 4.35 u[IU]/mL (ref 0.35–4.50)

## 2019-03-15 NOTE — Progress Notes (Signed)
Pt viewed via Mychart 

## 2019-04-03 ENCOUNTER — Other Ambulatory Visit: Payer: Self-pay

## 2019-04-05 ENCOUNTER — Ambulatory Visit (INDEPENDENT_AMBULATORY_CARE_PROVIDER_SITE_OTHER): Payer: 59 | Admitting: Internal Medicine

## 2019-04-05 ENCOUNTER — Other Ambulatory Visit: Payer: Self-pay

## 2019-04-05 ENCOUNTER — Encounter: Payer: Self-pay | Admitting: Internal Medicine

## 2019-04-05 VITALS — BP 130/70 | HR 54 | Ht 65.0 in | Wt 149.0 lb

## 2019-04-05 DIAGNOSIS — E042 Nontoxic multinodular goiter: Secondary | ICD-10-CM | POA: Diagnosis not present

## 2019-04-05 NOTE — Progress Notes (Signed)
Patient ID: Janet Knox, female   DOB: 12/18/1955, 63 y.o.   MRN: [id number]    HPI  Janet Knox is a 63 y.o.-year-old female, initially referred by her PCP, Dr. Carollee Herter, returning for follow-up for thyroid nodules.  Last visit a year ago.  She has no complaints at this visit.  Reviewed and addended history: Patient's right thyroid nodules were found incidentally on carotid ultrasound from 01/25/2018:  Right thyroid lobe: 1.5 cm complex cystic nodule  0.8 cm simple cyst Left thyroid lobe:  0.5 cm solid nodule  The above ultrasound was checked when patient had a severe migraine episode.  This showed decreased carotid flow.  She had a repeat carotid ultrasound in 03/23/2018 that showed normalization of the flow.   At last visit, we checked a thyroid ultrasound:  Thyroid U/S (04/07/2018): Intermediate partially cystic right thyroid nodule without worrisome features:  Parenchymal Echotexture: Mildly heterogenous Isthmus: 0.1 cm Right lobe: 3.9 x 1.4 x 1.2 cm Left lobe: 3.0 x 1.1 x 0.8 cm _______________________________________________________  Nodule # 1: Location: Right; Mid Maximum size: 1.5 cm; Other 2 dimensions: 0.8 x 0.9 cm Composition: mixed cystic and solid (1) Echogenicity: hypoechoic (2) *Given size (>/= 1.5 - 2.4 cm) and appearance, a follow-up ultrasound in 1 year should be considered based on TI-RADS criteria. __________________________________________  Other nodules measure 0.7 cm or less and do not meet criteria for biopsy nor follow-up.  IMPRESSION: Right mid nodule 1 meets criteria for annual follow-up.    Her thyroid tests were normal: Lab Results  Component Value Date   TSH 4.35 03/06/2019   TSH 3.38 08/05/2018   TSH 3.26 01/13/2018   TSH 2.80 01/05/2017   TSH 3.13 12/31/2015   TSH 2.77 12/20/2014   TSH 2.40 03/22/2013   TSH 1.48 01/27/2010   TSH 1.50 01/29/2009    Pt denies: - feeling nodules in neck - hoarseness - dysphagia -  choking - SOB with lying down  No FH of thyroid ds. No FH of thyroid cancer. No h/o radiation tx to head or neck.  No herbal supplements. No Biotin use. No recent steroids use.   Pt also has a history of HTN, HL, migraines, hysterectomy.  She is exercising consistently 3 times a week, by walking on the treadmill and riding an exercise bike.  ROS: Constitutional: no weight gain/no weight loss, no fatigue, no subjective hyperthermia, no subjective hypothermia Eyes: no blurry vision, no xerophthalmia ENT: no sore throat, + see HPI Cardiovascular: no CP/no SOB/no palpitations/no leg swelling Respiratory: no cough/no SOB/no wheezing Gastrointestinal: no N/no V/no D/no C/no acid reflux Musculoskeletal: no muscle aches/no joint aches Skin: no rashes, no hair loss Neurological: no tremors/no numbness/no tingling/no dizziness  I reviewed pt's medications, allergies, PMH, social hx, family hx, and changes were documented in the history of present illness. Otherwise, unchanged from my initial visit note.  Past Medical History:  Diagnosis Date  . Anemia   . Hyperlipidemia   . Hypertension   . Skin cancer    Past Surgical History:  Procedure Laterality Date  . ABDOMINAL HYSTERECTOMY    . EYE SURGERY Left    Torn Retina  . KNEE SURGERY     Social History   Socioeconomic History  . Marital status: Married    Spouse name: Not on file  . Number of children: 1  . Years of education: Not on file  . Highest education level: Not on file  Occupational History  . Occupation: [employer]  Tobacco  Use  . Smoking status: Never Smoker  . Smokeless tobacco: Never Used  Substance and Sexual Activity  . Alcohol use: Yes    Alcohol/week: 0.6 oz    Types: 1 Glasses of wine per week  . Drug use: No  . Sexual activity: Yes    Partners: Male  Lifestyle  . Physical activity:    Days per week: Not on file    Minutes per session: Not on file  Relationships  Social History Narrative    Exercise-- 3 x a week for 1 hour   Current Outpatient Medications on File Prior to Visit  Medication Sig Dispense Refill  . aspirin 81 MG tablet Take 81 mg by mouth daily.    Marland Kitchen atorvastatin (LIPITOR) 40 MG tablet Take 1 tablet (40 mg total) by mouth daily. 90 tablet 3  . Cholecalciferol (VITAMIN D3) 5000 UNITS TABS Take 1 tablet by mouth daily.     . Coenzyme Q10 (COQ-10) 200 MG CAPS Take 1 capsule by mouth daily.    Marland Kitchen estradiol (ESTRACE) 2 MG tablet TAKE 1 TABLET BY MOUTH  DAILY 90 tablet 0  . fenofibrate 54 MG tablet TAKE 1 TABLET BY MOUTH  DAILY 90 tablet 0  . Ferrous Sulfate 27 MG TABS Take 1 tablet by mouth daily.    . metoprolol tartrate (LOPRESSOR) 50 MG tablet TAKE 1 TABLET BY MOUTH TWO  TIMES DAILY 180 tablet 0  . Multiple Vitamin (MULTIVITAMIN) tablet Take 1 tablet by mouth daily.    . niacin 500 MG tablet Take 2 tablets (1,000 mg total) by mouth daily with breakfast. 180 tablet 3  . Omega-3 Fatty Acids (FISH OIL) 1200 MG CAPS Take 1 capsule by mouth daily.     . SUMAtriptan (IMITREX) 50 MG tablet Take 1 tablet (50 mg total) by mouth every 2 (two) hours as needed for migraine. May repeat in 2 hours if headache persists or recurs. 10 tablet 0  . vitamin E 400 UNIT capsule Take 400 Units by mouth daily.     No current facility-administered medications on file prior to visit.    No Known Allergies Family History  Problem Relation Age of Onset  . Alzheimer's disease Mother   . Hyperlipidemia Mother   . Hypertension Mother   . Coronary artery disease Paternal Grandfather   . Stroke Paternal Grandfather   . Hyperlipidemia Father   . Hypertension Father    Mother with basal cell carcinoma and brother with melanoma.  PE: BP 130/70   Pulse (!) 54   Ht 5\' 5"  (1.651 m)   Wt 149 lb (67.6 kg)   SpO2 99%   BMI 24.79 kg/m  Wt Readings from Last 3 Encounters:  04/05/19 149 lb (67.6 kg)  08/05/18 153 lb (69.4 kg)  04/01/18 153 lb 12.8 oz (69.8 kg)   Constitutional: normal  weight, in NAD Eyes: PERRLA, EOMI, no exophthalmos ENT: moist mucous membranes, no thyromegaly, no cervical lymphadenopathy Cardiovascular: RRR, No MRG Respiratory: CTA B Gastrointestinal: abdomen soft, NT, ND, BS+ Musculoskeletal: no deformities, strength intact in all 4 Skin: moist, warm, no rashes Neurological: no tremor with outstretched hands, DTR normal in all 4  ASSESSMENT: 1. Multiple thyroid nodules  PLAN: 1. Multiple thyroid nodules -At this visit, we reviewed the report of her thyroid ultrasound obtained after last visit and this showed an intermediate (1.5 cm) right thyroid nodule, partly cystic, without any worrisome features.  The nodule was hypoechoic (due to the cystic component), but did not have microcalcifications,  and internal blood flow, was not more tall than wide and it was well delimited from the surrounding tissues.  Therefore, I suggested that the patient return in 1 year for reevaluation. -At this visit, she has no neck compression symptoms -Reviewed recent thyroid function tests which are normal -We discussed that for now, no intervention is needed for the thyroid nodule but we will repeat a thyroid ultrasound  -If the nodule is unchanged, I will have her return in 2 years and would probably repeat an ultrasound then.  If the nodule is still stable at that time, I do not feel that we absolutely need to continue to follow the nodule especially since she does not have a particularly high risk for cancer (no radiation therapy to head or neck or family history of thyroid cancer). -She agrees with the plan -I will see her back in 2 years.  Orders Placed This Encounter  Procedures  . US THYROID   - time spent with the patient: 15 min, of which >50% was spent in obtaining information about her symptoms, reviewing her previous labs, evaluations, and treatments, counseling her about her condition (please see the discussed topics above), and developing a plan to further  investigate and treat it.  Philemon Kingdom, MD PhD [facility]

## 2019-04-05 NOTE — Patient Instructions (Addendum)
Please come back for a follow-up appointment in 2 years.

## 2019-04-17 ENCOUNTER — Encounter: Payer: Self-pay | Admitting: Family Medicine

## 2019-04-21 ENCOUNTER — Other Ambulatory Visit: Payer: Self-pay

## 2019-04-21 ENCOUNTER — Ambulatory Visit (INDEPENDENT_AMBULATORY_CARE_PROVIDER_SITE_OTHER): Payer: 59 | Admitting: Family Medicine

## 2019-04-21 ENCOUNTER — Encounter: Payer: Self-pay | Admitting: Family Medicine

## 2019-04-21 DIAGNOSIS — W57XXXA Bitten or stung by nonvenomous insect and other nonvenomous arthropods, initial encounter: Secondary | ICD-10-CM | POA: Diagnosis not present

## 2019-04-21 DIAGNOSIS — S0006XA Insect bite (nonvenomous) of scalp, initial encounter: Secondary | ICD-10-CM

## 2019-04-21 DIAGNOSIS — R5383 Other fatigue: Secondary | ICD-10-CM | POA: Diagnosis not present

## 2019-04-21 MED ORDER — DOXYCYCLINE HYCLATE 100 MG PO TABS: 100.0000 mg | ORAL_TABLET | Freq: Two times a day (BID) | ORAL | 0 refills | Status: DC

## 2019-04-21 NOTE — Progress Notes (Signed)
Virtual Visit via Video Note  I connected with Janet Knox on 04/21/19 at  3:30 PM EDT by a video enabled telemedicine application and verified that I am speaking with the correct person using two identifiers.  Location: Patient: home Provider: office    I discussed the limitations of evaluation and management by telemedicine and the availability of in person appointments. The patient expressed understanding and agreed to proceed.  History of Present Illness:   pt found tick on her scalp and she is not sure how long it has been there.  After a few days she started to feel extrem fatigue and weakness  And bodyaches.  No fevers, cough or sob Observations/Objective: No vitals obtained Pt is in nAD  Assessment and Plan: .1. Tick bite of scalp, initial encounter Check labs after covid if needed Doxy per orders  covid test - doxycycline (VIBRA-TABS) 100 MG tablet; Take 1 tablet (100 mg total) by mouth 2 (two) times daily.  Dispense: 20 tablet; Refill: 0 - B. burgdorfi antibodies; Future - [place] spotted fvr ab, IgM-blood; Future  2. Other fatigue  - Novel Coronavirus, NAA ([facility])  Follow Up Instructions:    I discussed the assessment and treatment plan with the patient. The patient was provided an opportunity to ask questions and all were answered. The patient agreed with the plan and demonstrated an understanding of the instructions.   The patient was advised to call back or seek an in-person evaluation if the symptoms worsen or if the condition fails to improve as anticipated.  I provided 15 minutes of non-face-to-face time during this encounter.   Ann Held, DO

## 2019-04-24 ENCOUNTER — Other Ambulatory Visit: Payer: Self-pay

## 2019-04-24 ENCOUNTER — Other Ambulatory Visit: Payer: 59

## 2019-04-24 DIAGNOSIS — Z20822 Contact with and (suspected) exposure to covid-19: Secondary | ICD-10-CM

## 2019-04-25 LAB — NOVEL CORONAVIRUS, NAA: SARS-CoV-2, NAA: NOT DETECTED

## 2019-05-01 ENCOUNTER — Other Ambulatory Visit: Payer: Self-pay

## 2019-05-01 ENCOUNTER — Other Ambulatory Visit (INDEPENDENT_AMBULATORY_CARE_PROVIDER_SITE_OTHER): Payer: 59

## 2019-05-01 DIAGNOSIS — W57XXXA Bitten or stung by nonvenomous insect and other nonvenomous arthropods, initial encounter: Secondary | ICD-10-CM

## 2019-05-01 DIAGNOSIS — S0006XA Insect bite (nonvenomous) of scalp, initial encounter: Secondary | ICD-10-CM | POA: Diagnosis not present

## 2019-05-02 ENCOUNTER — Ambulatory Visit
Admission: RE | Admit: 2019-05-02 | Discharge: 2019-05-02 | Disposition: A | Discharge status: Home / Self Care | Payer: 59 | Source: Ambulatory Visit | Attending: Internal Medicine | Admitting: Internal Medicine

## 2019-05-02 DIAGNOSIS — E042 Nontoxic multinodular goiter: Secondary | ICD-10-CM

## 2019-05-02 LAB — ROCKY MTN SPOTTED FVR ABS PNL(IGG+IGM)
RMSF IgG: NOT DETECTED
RMSF IgM: NOT DETECTED

## 2019-05-02 LAB — B. BURGDORFI ANTIBODIES: B burgdorferi Ab IgG+IgM: <0.9 index

## 2019-05-10 ENCOUNTER — Other Ambulatory Visit: Payer: Self-pay | Admitting: Family Medicine

## 2019-05-10 DIAGNOSIS — Z0000 Encounter for general adult medical examination without abnormal findings: Secondary | ICD-10-CM

## 2019-05-10 DIAGNOSIS — I10 Essential (primary) hypertension: Secondary | ICD-10-CM

## 2019-05-10 DIAGNOSIS — Z780 Asymptomatic menopausal state: Secondary | ICD-10-CM

## 2019-05-13 ENCOUNTER — Other Ambulatory Visit: Payer: Self-pay | Admitting: Family Medicine

## 2019-05-13 DIAGNOSIS — Z0000 Encounter for general adult medical examination without abnormal findings: Secondary | ICD-10-CM

## 2019-06-21 ENCOUNTER — Other Ambulatory Visit: Payer: Self-pay

## 2019-06-21 ENCOUNTER — Ambulatory Visit (INDEPENDENT_AMBULATORY_CARE_PROVIDER_SITE_OTHER): Payer: 59

## 2019-06-21 DIAGNOSIS — Z23 Encounter for immunization: Secondary | ICD-10-CM | POA: Diagnosis not present

## 2019-09-04 ENCOUNTER — Encounter: Payer: Self-pay | Admitting: Family Medicine

## 2019-09-04 ENCOUNTER — Ambulatory Visit (INDEPENDENT_AMBULATORY_CARE_PROVIDER_SITE_OTHER): Payer: 59 | Admitting: Family Medicine

## 2019-09-04 ENCOUNTER — Other Ambulatory Visit: Payer: Self-pay

## 2019-09-04 VITALS — BP 179/101 | Temp 99.0°F | Ht 65.0 in | Wt 148.0 lb

## 2019-09-04 DIAGNOSIS — J0140 Acute pansinusitis, unspecified: Secondary | ICD-10-CM

## 2019-09-04 MED ORDER — AMOXICILLIN-POT CLAVULANATE 875-125 MG PO TABS: 1.0000 | ORAL_TABLET | Freq: Two times a day (BID) | ORAL | 0 refills | Status: DC

## 2019-09-04 NOTE — Progress Notes (Deleted)
Virtual Visit via Video Note  I connected with Janet Knox on 09/04/19 at  2:00 PM EST by a video enabled telemedicine application and verified that I am speaking with the correct person using two identifiers.  Location: Patient: home alone  Provider: office    I discussed the limitations of evaluation and management by telemedicine and the availability of in person appointments. The patient expressed understanding and agreed to proceed.  History of Present Illness: Pt is home c/o runny nose and sinus congestion and pressure in the ears about 1 week ago   She is taking advil  --- + low grade fever    Observations/Objective:   Assessment and Plan:   Follow Up Instructions:    I discussed the assessment and treatment plan with the patient. The patient was provided an opportunity to ask questions and all were answered. The patient agreed with the plan and demonstrated an understanding of the instructions.   The patient was advised to call back or seek an in-person evaluation if the symptoms worsen or if the condition fails to improve as anticipated.  I provided *** minutes of non-face-to-face time during this encounter.   Ann Held, DO

## 2019-09-04 NOTE — Progress Notes (Signed)
Virtual Visit via Telephone Note  I connected with Janet Knox on 09/04/19 at  2:00 PM EST by telephone and verified that I am speaking with the correct person using two identifiers.  Location: Patient: home alone  Provider: office    I discussed the limitations, risks, security and privacy concerns of performing an evaluation and management service by telephone and the availability of in person appointments. I also discussed with the patient that there may be a patient responsible charge related to this service. The patient expressed understanding and agreed to proceed.   History of Present Illness: Pt is home c/o sinus symptoms , headache x 1 week.  + low grade fever   + advil prn  + nasal congestion  No prod cough    Observations/Objective: Vitals:   09/04/19 1359  BP: (!) 179/101  Temp: 99 F (37.2 C)   pt is congested sounding but no sob Pt is in NAd --- repeat bp was lower but tends to  Assessment and Plan: 1. Acute non-recurrent pansinusitis abx per orders con't flonase  Pt to [facility] cov[facility]  - amoxicillin-clavulanate (AUGMENTIN) 875-125 MG tablet; Take 1 tablet by mouth 2 (two) times daily.  Dispense: 20 tablet; Refill: 0   Follow Up Instructions:    I discussed the assessment and treatment plan with the patient. The patient was provided an opportunity to ask questions and all were answered. The patient agreed with the plan and demonstrated an understanding of the instructions.   The patient was advised to call back or seek an in-person evaluation if the symptoms worsen or if the condition fails to improve as anticipated.  I provided 15 minutes of non-face-to-face time during this encounter.   Ann Held, DO

## 2019-09-05 ENCOUNTER — Other Ambulatory Visit: Payer: Self-pay

## 2019-09-05 DIAGNOSIS — Z20822 Contact with and (suspected) exposure to covid-19: Secondary | ICD-10-CM

## 2019-09-07 LAB — NOVEL CORONAVIRUS, NAA: SARS-CoV-2, NAA: DETECTED — AB

## 2019-10-09 ENCOUNTER — Encounter: Payer: Self-pay | Admitting: Family Medicine

## 2019-10-09 NOTE — Telephone Encounter (Signed)
We can do virtual tomorrow -- she probably does need antibiotics

## 2019-10-10 ENCOUNTER — Other Ambulatory Visit: Payer: Self-pay

## 2019-10-10 ENCOUNTER — Ambulatory Visit (INDEPENDENT_AMBULATORY_CARE_PROVIDER_SITE_OTHER): Payer: 59 | Admitting: Family Medicine

## 2019-10-10 ENCOUNTER — Encounter: Payer: Self-pay | Admitting: Family Medicine

## 2019-10-10 DIAGNOSIS — J0140 Acute pansinusitis, unspecified: Secondary | ICD-10-CM

## 2019-10-10 MED ORDER — DOXYCYCLINE HYCLATE 100 MG PO TABS: 100.0000 mg | ORAL_TABLET | Freq: Two times a day (BID) | ORAL | 0 refills | Status: DC

## 2019-10-10 NOTE — Progress Notes (Addendum)
Virtual Visit via Telephone Note  I connected with AADITRI CHAVEZ on 10/10/19 at  3:20 PM EST by telephone and verified that I am speaking with the correct person using two identifiers.  Location: Patient: home alone  Provider: home    I discussed the limitations, risks, security and privacy concerns of performing an evaluation and management service by telephone and the availability of in person appointments. I also discussed with the patient that there may be a patient responsible charge related to this service. The patient expressed understanding and agreed to proceed.   History of Present Illness: Pt is home  She is taking flonase and saline nasal spray and she is taking mucinex sinus   she had covid last month and most of those symptoms have resolved the the sinus congestion has not resolved  Observations/Objective: No fevers  No other vitals obtained   Assessment and Plan: 1. Acute non-recurrent pansinusitis con't flonase and antihistamine Pt had covid several weeks ago  - doxycycline (VIBRA-TABS) 100 MG tablet; Take 1 tablet (100 mg total) by mouth 2 (two) times daily.  Dispense: 20 tablet; Refill: 0 Call if symptoms worsen   Follow Up Instructions:    I discussed the assessment and treatment plan with the patient. The patient was provided an opportunity to ask questions and all were answered. The patient agreed with the plan and demonstrated an understanding of the instructions.   The patient was advised to call back or seek an in-person evaluation if the symptoms worsen or if the condition fails to improve as anticipated.  VV lasted 15 min  Ann Held, DO

## 2020-01-06 ENCOUNTER — Other Ambulatory Visit: Payer: Self-pay | Admitting: Family Medicine

## 2020-03-29 ENCOUNTER — Other Ambulatory Visit: Payer: Self-pay | Admitting: Family Medicine

## 2020-03-29 DIAGNOSIS — Z0000 Encounter for general adult medical examination without abnormal findings: Secondary | ICD-10-CM

## 2020-03-29 DIAGNOSIS — I10 Essential (primary) hypertension: Secondary | ICD-10-CM

## 2020-03-29 DIAGNOSIS — Z780 Asymptomatic menopausal state: Secondary | ICD-10-CM

## 2020-04-02 ENCOUNTER — Other Ambulatory Visit: Payer: Self-pay | Admitting: Family Medicine

## 2020-04-02 DIAGNOSIS — Z0000 Encounter for general adult medical examination without abnormal findings: Secondary | ICD-10-CM

## 2020-04-08 ENCOUNTER — Encounter: Payer: Self-pay | Admitting: Family Medicine

## 2020-04-08 ENCOUNTER — Other Ambulatory Visit: Payer: Self-pay

## 2020-04-08 ENCOUNTER — Ambulatory Visit: Payer: 59 | Admitting: Family Medicine

## 2020-04-08 VITALS — BP 180/100 | HR 69 | Temp 98.8°F | Resp 18 | Ht 65.0 in | Wt 147.8 lb

## 2020-04-08 DIAGNOSIS — I10 Essential (primary) hypertension: Secondary | ICD-10-CM

## 2020-04-08 DIAGNOSIS — Z8669 Personal history of other diseases of the nervous system and sense organs: Secondary | ICD-10-CM

## 2020-04-08 MED ORDER — SUMATRIPTAN SUCCINATE 50 MG PO TABS: 50.0000 mg | ORAL_TABLET | ORAL | 0 refills | Status: DC | PRN

## 2020-04-08 MED ORDER — LOSARTAN POTASSIUM 50 MG PO TABS: 50.0000 mg | ORAL_TABLET | Freq: Every day | ORAL | 3 refills | Status: DC

## 2020-04-08 NOTE — Progress Notes (Signed)
Patient ID: Janet Knox, female    DOB: 1955-12-22  Age: 64 y.o. MRN: [id number]    Subjective:  Subjective  HPI ANTORIA LANZA presents for htn and stress.   Her bp and has been elevated and she having more headaches  No chest pain   Review of Systems  Constitutional: Negative for appetite change, diaphoresis, fatigue and unexpected weight change.  Eyes: Negative for pain, redness and visual disturbance.  Respiratory: Negative for cough, chest tightness, shortness of breath and wheezing.   Cardiovascular: Negative for chest pain, palpitations and leg swelling.  Endocrine: Negative for cold intolerance, heat intolerance, polydipsia, polyphagia and polyuria.  Genitourinary: Negative for difficulty urinating, dysuria and frequency.  Neurological: Negative for dizziness, light-headedness, numbness and headaches.    History Past Medical History:  Diagnosis Date  . Anemia   . Hyperlipidemia   . Hypertension   . Skin cancer     She has a past surgical history that includes Abdominal hysterectomy; Knee surgery; and Eye surgery (Left).   Her family history includes Alzheimer's disease in her mother; Coronary artery disease in her paternal grandfather; Hyperlipidemia in her father and mother; Hypertension in her father and mother; Stroke in her paternal grandfather.She reports that she has never smoked. She has never used smokeless tobacco. She reports current alcohol use of about 1.0 standard drink of alcohol per week. She reports that she does not use drugs.  Current Outpatient Medications on File Prior to Visit  Medication Sig Dispense Refill  . aspirin 81 MG tablet Take 81 mg by mouth daily.    Marland Kitchen atorvastatin (LIPITOR) 40 MG tablet Take 1 tablet (40 mg total) by mouth daily. 90 tablet 3  . Cholecalciferol (VITAMIN D3) 5000 UNITS TABS Take 1 tablet by mouth daily.     . Coenzyme Q10 (COQ-10) 200 MG CAPS Take 1 capsule by mouth daily.    Marland Kitchen estradiol (ESTRACE) 2 MG tablet Take 1  tablet (2 mg total) by mouth daily. Pt needs follow up for further refills 90 tablet 0  . fenofibrate 54 MG tablet Take 1 tablet (54 mg total) by mouth daily. 90 tablet 1  . Ferrous Sulfate 27 MG TABS Take 1 tablet by mouth daily.    . metoprolol tartrate (LOPRESSOR) 50 MG tablet Take 1 tablet (50 mg total) by mouth 2 (two) times daily. Pt needs follow up for further refills 180 tablet 0  . Multiple Vitamin (MULTIVITAMIN) tablet Take 1 tablet by mouth daily.    . niacin 500 MG tablet Take 2 tablets (1,000 mg total) by mouth daily with breakfast. 180 tablet 3  . Omega-3 Fatty Acids (FISH OIL) 1200 MG CAPS Take 1 capsule by mouth daily.     . vitamin E 400 UNIT capsule Take 400 Units by mouth daily.     No current facility-administered medications on file prior to visit.     Objective:  Objective  Physical Exam Vitals and nursing note reviewed.  Constitutional:      Appearance: She is well-developed.  HENT:     Head: Normocephalic and atraumatic.  Eyes:     Conjunctiva/sclera: Conjunctivae normal.  Neck:     Thyroid: No thyromegaly.     Vascular: No carotid bruit or JVD.  Cardiovascular:     Rate and Rhythm: Normal rate and regular rhythm.     Heart sounds: Normal heart sounds. No murmur heard.   Pulmonary:     Effort: Pulmonary effort is normal. No respiratory distress.  Breath sounds: Normal breath sounds. No wheezing or rales.  Chest:     Chest wall: No tenderness.  Musculoskeletal:     Cervical back: Normal range of motion and neck supple.  Neurological:     Mental Status: She is alert and oriented to person, place, and time.    BP (!) 180/100 (BP Location: Left Arm, Patient Position: Sitting, Cuff Size: Normal)   Pulse 69   Temp 98.8 F (37.1 C) (Temporal)   Resp 18   Ht 5\' 5"  (1.651 m)   Wt 147 lb 12.8 oz (67 kg)   SpO2 98%   BMI 24.60 kg/m  Wt Readings from Last 3 Encounters:  04/08/20 147 lb 12.8 oz (67 kg)  09/04/19 148 lb (67.1 kg)  04/05/19 149 lb  (67.6 kg)     Lab Results  Component Value Date   WBC 4.2 03/06/2019   HGB 11.9 (L) 03/06/2019   HCT 36.3 03/06/2019   PLT 236.0 03/06/2019   GLUCOSE 87 03/06/2019   CHOL 154 03/06/2019   TRIG 116.0 03/06/2019   HDL 58.50 03/06/2019   LDLDIRECT 117.5 01/27/2010   LDLCALC 72 03/06/2019   ALT 12 03/06/2019   AST 19 03/06/2019   NA 139 03/06/2019   K 4.8 03/06/2019   CL 106 03/06/2019   CREATININE 0.70 03/06/2019   BUN 22 03/06/2019   CO2 27 03/06/2019   TSH 4.35 03/06/2019   MICROALBUR 0.7 03/22/2013    US THYROID  Result Date: 05/03/2019 CLINICAL DATA:  Thyroid nodules EXAM: THYROID ULTRASOUND TECHNIQUE: Ultrasound examination of the thyroid gland and adjacent soft tissues was performed. COMPARISON:  04/06/2018 FINDINGS: Parenchymal Echotexture: Mildly heterogenous Isthmus: 1 mm Right lobe: 3.9 x 1.1 x 1.3 cm, previously 3.9 x 1.4 x 1.2 cm Left lobe: 3.1 x 0.8 x 1.1 cm, previously 3.0 x 1.1 x 0.8 cm _________________________________________________________ Estimated total number of nodules >/= 1 cm: 2 Number of spongiform nodules >/=  2 cm not described below (TR1): 0 Number of mixed cystic and solid nodules >/= 1.5 cm not described below ([place]): 0 _________________________________________________________ Nodule # 2: Location: Right; Mid Maximum size: 1.6, previously 1.5 cm; Other 2 dimensions: 1.0 x 0.6 cm Composition: mixed cystic and solid (1) Echogenicity: isoechoic (1) Shape: not taller-than-wide (0) Margins: smooth (0) Echogenic foci: none (0) ACR TI-RADS total points: 2. ACR TI-RADS risk category: TR2 (2 points). ACR TI-RADS recommendations: This nodule does NOT meet TI-RADS criteria for biopsy or dedicated follow-up. This nodule is now more cystic, and has been down graded and no longer warrants follow-up _________________________________________________________ There are additional bilateral isoechoic and mixed cystic/solid nodules noted all measuring 1 cm or less in size and  would not meet criteria for any biopsy or follow-up. No hypervascularity.  No regional adenopathy. IMPRESSION: Stable 1.6 cm right mid thyroid TR 2 nodule previously TR 3. This nodule has become more cystic and down graded. No further follow-up or biopsy warranted. Stable additional subcentimeter nodules. No new finding. The above is in keeping with the ACR TI-RADS recommendations - J Am Coll Radiol 2017;14:587-595. Electronically Signed   By: Jerilynn Mages.  Shick M.D.   On: 05/03/2019 09:39     Assessment & Plan:  Plan  I have discontinued ADDALINE PEPLINSKI "Donna"'s amoxicillin-clavulanate and doxycycline. I am also having her start on losartan. Additionally, I am having her maintain her vitamin E, multivitamin, aspirin, CoQ-10, Fish Oil, Vitamin D3, niacin, Ferrous Sulfate, atorvastatin, estradiol, metoprolol tartrate, fenofibrate, and SUMAtriptan.  Meds ordered this encounter  Medications  . losartan (COZAAR) 50 MG tablet    Sig: Take 1 tablet (50 mg total) by mouth daily.    Dispense:  30 tablet    Refill:  3  . SUMAtriptan (IMITREX) 50 MG tablet    Sig: Take 1 tablet (50 mg total) by mouth every 2 (two) hours as needed for migraine. May repeat in 2 hours if headache persists or recurs.    Dispense:  10 tablet    Refill:  0    Problem List Items Addressed This Visit      Unprioritized   Essential hypertension - Primary    Poorly controlled will alter medications, encouraged DASH diet, minimize caffeine and obtain adequate sleep. Report concerning symptoms and follow up as directed and as needed con't metoprolol Add losartan 50 mg  F/u 2-3 weeks       Relevant Medications   losartan (COZAAR) 50 MG tablet   History of migraine    May be from bp---  Refill imitrex F/u 2-3 weeks       Relevant Medications   SUMAtriptan (IMITREX) 50 MG tablet      Follow-up: Return in about 2 weeks (around 04/22/2020) for hypertension.  Ann Held, DO

## 2020-04-08 NOTE — Assessment & Plan Note (Signed)
May be from bp---  Refill imitrex F/u 2-3 weeks

## 2020-04-08 NOTE — Patient Instructions (Signed)
DASH Eating Plan DASH stands for "Dietary Approaches to Stop Hypertension." The DASH eating plan is a healthy eating plan that has been shown to reduce high blood pressure (hypertension). It may also reduce your risk for type 2 diabetes, heart disease, and stroke. The DASH eating plan may also help with weight loss. What are tips for following this plan?  General guidelines  Avoid eating more than 2,300 mg (milligrams) of salt (sodium) a day. If you have hypertension, you may need to reduce your sodium intake to 1,500 mg a day.  Limit alcohol intake to no more than 1 drink a day for nonpregnant women and 2 drinks a day for men. One drink equals 12 oz of beer, 5 oz of wine, or 1 oz of hard liquor.  Work with your health care provider to maintain a healthy body weight or to lose weight. Ask what an ideal weight is for you.  Get at least 30 minutes of exercise that causes your heart to beat faster (aerobic exercise) most days of the week. Activities may include walking, swimming, or biking.  Work with your health care provider or diet and nutrition specialist (dietitian) to adjust your eating plan to your individual calorie needs. Reading food labels   Check food labels for the amount of sodium per serving. Choose foods with less than 5 percent of the Daily Value of sodium. Generally, foods with less than 300 mg of sodium per serving fit into this eating plan.  To find whole grains, look for the word "whole" as the first word in the ingredient list. Shopping  Buy products labeled as "low-sodium" or "no salt added."  Buy fresh foods. Avoid canned foods and premade or frozen meals. Cooking  Avoid adding salt when cooking. Use salt-free seasonings or herbs instead of table salt or sea salt. Check with your health care provider or pharmacist before using salt substitutes.  Do not fry foods. Cook foods using healthy methods such as baking, boiling, grilling, and broiling instead.  Cook with  heart-healthy oils, such as olive, canola, soybean, or sunflower oil. Meal planning  Eat a balanced diet that includes: ? 5 or more servings of fruits and vegetables each day. At each meal, try to fill half of your plate with fruits and vegetables. ? Up to 6-8 servings of whole grains each day. ? Less than 6 oz of lean meat, poultry, or fish each day. A 3-oz serving of meat is about the same size as a deck of cards. One egg equals 1 oz. ? 2 servings of low-fat dairy each day. ? A serving of nuts, seeds, or beans 5 times each week. ? Heart-healthy fats. Healthy fats called Omega-3 fatty acids are found in foods such as flaxseeds and coldwater fish, like sardines, salmon, and mackerel.  Limit how much you eat of the following: ? Canned or prepackaged foods. ? Food that is high in trans fat, such as fried foods. ? Food that is high in saturated fat, such as fatty meat. ? Sweets, desserts, sugary drinks, and other foods with added sugar. ? Full-fat dairy products.  Do not salt foods before eating.  Try to eat at least 2 vegetarian meals each week.  Eat more home-cooked food and less restaurant, buffet, and fast food.  When eating at a restaurant, ask that your food be prepared with less salt or no salt, if possible. What foods are recommended? The items listed may not be a complete list. Talk with your dietitian about   what dietary choices are best for you. Grains Whole-grain or whole-wheat bread. Whole-grain or whole-wheat pasta. Brown rice. Oatmeal. Quinoa. Bulgur. Whole-grain and low-sodium cereals. Pita bread. Low-fat, low-sodium crackers. Whole-wheat flour tortillas. Vegetables Fresh or frozen vegetables (raw, steamed, roasted, or grilled). Low-sodium or reduced-sodium tomato and vegetable juice. Low-sodium or reduced-sodium tomato sauce and tomato paste. Low-sodium or reduced-sodium canned vegetables. Fruits All fresh, dried, or frozen fruit. Canned fruit in natural juice (without  added sugar). Meat and other protein foods Skinless chicken or turkey. Ground chicken or turkey. Pork with fat trimmed off. Fish and seafood. Egg whites. Dried beans, peas, or lentils. Unsalted nuts, nut butters, and seeds. Unsalted canned beans. Lean cuts of beef with fat trimmed off. Low-sodium, lean deli meat. Dairy Low-fat (1%) or fat-free (skim) milk. Fat-free, low-fat, or reduced-fat cheeses. Nonfat, low-sodium ricotta or cottage cheese. Low-fat or nonfat yogurt. Low-fat, low-sodium cheese. Fats and oils Soft margarine without trans fats. Vegetable oil. Low-fat, reduced-fat, or light mayonnaise and salad dressings (reduced-sodium). Canola, safflower, olive, soybean, and sunflower oils. Avocado. Seasoning and other foods Herbs. Spices. Seasoning mixes without salt. Unsalted popcorn and pretzels. Fat-free sweets. What foods are not recommended? The items listed may not be a complete list. Talk with your dietitian about what dietary choices are best for you. Grains Baked goods made with fat, such as croissants, muffins, or some breads. Dry pasta or rice meal packs. Vegetables Creamed or fried vegetables. Vegetables in a cheese sauce. Regular canned vegetables (not low-sodium or reduced-sodium). Regular canned tomato sauce and paste (not low-sodium or reduced-sodium). Regular tomato and vegetable juice (not low-sodium or reduced-sodium). Pickles. Olives. Fruits Canned fruit in a light or heavy syrup. Fried fruit. Fruit in cream or butter sauce. Meat and other protein foods Fatty cuts of meat. Ribs. Fried meat. Bacon. Sausage. Bologna and other processed lunch meats. Salami. Fatback. Hotdogs. Bratwurst. Salted nuts and seeds. Canned beans with added salt. Canned or smoked fish. Whole eggs or egg yolks. Chicken or turkey with skin. Dairy Whole or 2% milk, cream, and half-and-half. Whole or full-fat cream cheese. Whole-fat or sweetened yogurt. Full-fat cheese. Nondairy creamers. Whipped toppings.  Processed cheese and cheese spreads. Fats and oils Butter. Stick margarine. Lard. Shortening. Ghee. Bacon fat. Tropical oils, such as coconut, palm kernel, or palm oil. Seasoning and other foods Salted popcorn and pretzels. Onion salt, garlic salt, seasoned salt, table salt, and sea salt. Worcestershire sauce. Tartar sauce. Barbecue sauce. Teriyaki sauce. Soy sauce, including reduced-sodium. Steak sauce. Canned and packaged gravies. Fish sauce. Oyster sauce. Cocktail sauce. Horseradish that you find on the shelf. Ketchup. Mustard. Meat flavorings and tenderizers. Bouillon cubes. Hot sauce and Tabasco sauce. Premade or packaged marinades. Premade or packaged taco seasonings. Relishes. Regular salad dressings. Where to find more information:  National Heart, Lung, and Blood Institute: www.nhlbi.nih.gov  American Heart Association: www.heart.org Summary  The DASH eating plan is a healthy eating plan that has been shown to reduce high blood pressure (hypertension). It may also reduce your risk for type 2 diabetes, heart disease, and stroke.  With the DASH eating plan, you should limit salt (sodium) intake to 2,300 mg a day. If you have hypertension, you may need to reduce your sodium intake to 1,500 mg a day.  When on the DASH eating plan, aim to eat more fresh fruits and vegetables, whole grains, lean proteins, low-fat dairy, and heart-healthy fats.  Work with your health care provider or diet and nutrition specialist (dietitian) to adjust your eating plan to your   individual calorie needs. This information is not intended to replace advice given to you by your health care provider. Make sure you discuss any questions you have with your health care provider. Document Revised: 08/27/2017 Document Reviewed: 09/07/2016 Elsevier Patient Education  2020 Elsevier Inc.  

## 2020-04-08 NOTE — Assessment & Plan Note (Signed)
Poorly controlled will alter medications, encouraged DASH diet, minimize caffeine and obtain adequate sleep. Report concerning symptoms and follow up as directed and as needed con't metoprolol Add losartan 50 mg  F/u 2-3 weeks

## 2020-04-30 ENCOUNTER — Other Ambulatory Visit: Payer: Self-pay

## 2020-04-30 ENCOUNTER — Ambulatory Visit: Payer: 59 | Admitting: Family Medicine

## 2020-04-30 ENCOUNTER — Encounter: Payer: Self-pay | Admitting: Family Medicine

## 2020-04-30 VITALS — BP 140/90 | HR 58 | Temp 97.6°F | Resp 18 | Ht 65.0 in | Wt 148.2 lb

## 2020-04-30 DIAGNOSIS — I10 Essential (primary) hypertension: Secondary | ICD-10-CM

## 2020-04-30 DIAGNOSIS — E1169 Type 2 diabetes mellitus with other specified complication: Secondary | ICD-10-CM | POA: Diagnosis not present

## 2020-04-30 DIAGNOSIS — E785 Hyperlipidemia, unspecified: Secondary | ICD-10-CM | POA: Diagnosis not present

## 2020-04-30 LAB — COMPREHENSIVE METABOLIC PANEL
ALT: 14 U/L (ref 0–35)
AST: 21 U/L (ref 0–37)
Albumin: 4.2 g/dL (ref 3.5–5.2)
Alkaline Phosphatase: 48 U/L (ref 39–117)
BUN: 16 mg/dL (ref 6–23)
CO2: 29 mEq/L (ref 19–32)
Calcium: 9.4 mg/dL (ref 8.4–10.5)
Chloride: 103 mEq/L (ref 96–112)
Creatinine, Ser: 0.62 mg/dL (ref 0.40–1.20)
GFR: 96.67 mL/min (ref >60.00)
Glucose, Bld: 86 mg/dL (ref 70–99)
Potassium: 4.5 mEq/L (ref 3.5–5.1)
Sodium: 137 mEq/L (ref 135–145)
Total Bilirubin: 0.6 mg/dL (ref 0.2–1.2)
Total Protein: 6.8 g/dL (ref 6.0–8.3)

## 2020-04-30 LAB — LIPID PANEL
Cholesterol: 166 mg/dL (ref 0–200)
HDL: 63.8 mg/dL (ref >39.00)
LDL Cholesterol: 85 mg/dL (ref 0–99)
NonHDL: 101.71
Total CHOL/HDL Ratio: 3
Triglycerides: 82 mg/dL (ref 0.0–149.0)
VLDL: 16.4 mg/dL (ref 0.0–40.0)

## 2020-04-30 MED ORDER — LOSARTAN POTASSIUM 100 MG PO TABS: 100.0000 mg | ORAL_TABLET | Freq: Every day | ORAL | 3 refills | Status: DC

## 2020-04-30 MED ORDER — LOSARTAN POTASSIUM 100 MG PO TABS: 100.0000 mg | ORAL_TABLET | Freq: Every day | ORAL | 0 refills | Status: DC

## 2020-04-30 NOTE — Patient Instructions (Signed)
DASH Eating Plan DASH stands for "Dietary Approaches to Stop Hypertension." The DASH eating plan is a healthy eating plan that has been shown to reduce high blood pressure (hypertension). It may also reduce your risk for type 2 diabetes, heart disease, and stroke. The DASH eating plan may also help with weight loss. What are tips for following this plan?  General guidelines  Avoid eating more than 2,300 mg (milligrams) of salt (sodium) a day. If you have hypertension, you may need to reduce your sodium intake to 1,500 mg a day.  Limit alcohol intake to no more than 1 drink a day for nonpregnant women and 2 drinks a day for men. One drink equals 12 oz of beer, 5 oz of wine, or 1 oz of hard liquor.  Work with your health care provider to maintain a healthy body weight or to lose weight. Ask what an ideal weight is for you.  Get at least 30 minutes of exercise that causes your heart to beat faster (aerobic exercise) most days of the week. Activities may include walking, swimming, or biking.  Work with your health care provider or diet and nutrition specialist (dietitian) to adjust your eating plan to your individual calorie needs. Reading food labels   Check food labels for the amount of sodium per serving. Choose foods with less than 5 percent of the Daily Value of sodium. Generally, foods with less than 300 mg of sodium per serving fit into this eating plan.  To find whole grains, look for the word "whole" as the first word in the ingredient list. Shopping  Buy products labeled as "low-sodium" or "no salt added."  Buy fresh foods. Avoid canned foods and premade or frozen meals. Cooking  Avoid adding salt when cooking. Use salt-free seasonings or herbs instead of table salt or sea salt. Check with your health care provider or pharmacist before using salt substitutes.  Do not fry foods. Cook foods using healthy methods such as baking, boiling, grilling, and broiling instead.  Cook with  heart-healthy oils, such as olive, canola, soybean, or sunflower oil. Meal planning  Eat a balanced diet that includes: ? 5 or more servings of fruits and vegetables each day. At each meal, try to fill half of your plate with fruits and vegetables. ? Up to 6-8 servings of whole grains each day. ? Less than 6 oz of lean meat, poultry, or fish each day. A 3-oz serving of meat is about the same size as a deck of cards. One egg equals 1 oz. ? 2 servings of low-fat dairy each day. ? A serving of nuts, seeds, or beans 5 times each week. ? Heart-healthy fats. Healthy fats called Omega-3 fatty acids are found in foods such as flaxseeds and coldwater fish, like sardines, salmon, and mackerel.  Limit how much you eat of the following: ? Canned or prepackaged foods. ? Food that is high in trans fat, such as fried foods. ? Food that is high in saturated fat, such as fatty meat. ? Sweets, desserts, sugary drinks, and other foods with added sugar. ? Full-fat dairy products.  Do not salt foods before eating.  Try to eat at least 2 vegetarian meals each week.  Eat more home-cooked food and less restaurant, buffet, and fast food.  When eating at a restaurant, ask that your food be prepared with less salt or no salt, if possible. What foods are recommended? The items listed may not be a complete list. Talk with your dietitian about   what dietary choices are best for you. Grains Whole-grain or whole-wheat bread. Whole-grain or whole-wheat pasta. Brown rice. Oatmeal. Quinoa. Bulgur. Whole-grain and low-sodium cereals. Pita bread. Low-fat, low-sodium crackers. Whole-wheat flour tortillas. Vegetables Fresh or frozen vegetables (raw, steamed, roasted, or grilled). Low-sodium or reduced-sodium tomato and vegetable juice. Low-sodium or reduced-sodium tomato sauce and tomato paste. Low-sodium or reduced-sodium canned vegetables. Fruits All fresh, dried, or frozen fruit. Canned fruit in natural juice (without  added sugar). Meat and other protein foods Skinless chicken or turkey. Ground chicken or turkey. Pork with fat trimmed off. Fish and seafood. Egg whites. Dried beans, peas, or lentils. Unsalted nuts, nut butters, and seeds. Unsalted canned beans. Lean cuts of beef with fat trimmed off. Low-sodium, lean deli meat. Dairy Low-fat (1%) or fat-free (skim) milk. Fat-free, low-fat, or reduced-fat cheeses. Nonfat, low-sodium ricotta or cottage cheese. Low-fat or nonfat yogurt. Low-fat, low-sodium cheese. Fats and oils Soft margarine without trans fats. Vegetable oil. Low-fat, reduced-fat, or light mayonnaise and salad dressings (reduced-sodium). Canola, safflower, olive, soybean, and sunflower oils. Avocado. Seasoning and other foods Herbs. Spices. Seasoning mixes without salt. Unsalted popcorn and pretzels. Fat-free sweets. What foods are not recommended? The items listed may not be a complete list. Talk with your dietitian about what dietary choices are best for you. Grains Baked goods made with fat, such as croissants, muffins, or some breads. Dry pasta or rice meal packs. Vegetables Creamed or fried vegetables. Vegetables in a cheese sauce. Regular canned vegetables (not low-sodium or reduced-sodium). Regular canned tomato sauce and paste (not low-sodium or reduced-sodium). Regular tomato and vegetable juice (not low-sodium or reduced-sodium). Pickles. Olives. Fruits Canned fruit in a light or heavy syrup. Fried fruit. Fruit in cream or butter sauce. Meat and other protein foods Fatty cuts of meat. Ribs. Fried meat. Bacon. Sausage. Bologna and other processed lunch meats. Salami. Fatback. Hotdogs. Bratwurst. Salted nuts and seeds. Canned beans with added salt. Canned or smoked fish. Whole eggs or egg yolks. Chicken or turkey with skin. Dairy Whole or 2% milk, cream, and half-and-half. Whole or full-fat cream cheese. Whole-fat or sweetened yogurt. Full-fat cheese. Nondairy creamers. Whipped toppings.  Processed cheese and cheese spreads. Fats and oils Butter. Stick margarine. Lard. Shortening. Ghee. Bacon fat. Tropical oils, such as coconut, palm kernel, or palm oil. Seasoning and other foods Salted popcorn and pretzels. Onion salt, garlic salt, seasoned salt, table salt, and sea salt. Worcestershire sauce. Tartar sauce. Barbecue sauce. Teriyaki sauce. Soy sauce, including reduced-sodium. Steak sauce. Canned and packaged gravies. Fish sauce. Oyster sauce. Cocktail sauce. Horseradish that you find on the shelf. Ketchup. Mustard. Meat flavorings and tenderizers. Bouillon cubes. Hot sauce and Tabasco sauce. Premade or packaged marinades. Premade or packaged taco seasonings. Relishes. Regular salad dressings. Where to find more information:  National Heart, Lung, and Blood Institute: www.nhlbi.nih.gov  American Heart Association: www.heart.org Summary  The DASH eating plan is a healthy eating plan that has been shown to reduce high blood pressure (hypertension). It may also reduce your risk for type 2 diabetes, heart disease, and stroke.  With the DASH eating plan, you should limit salt (sodium) intake to 2,300 mg a day. If you have hypertension, you may need to reduce your sodium intake to 1,500 mg a day.  When on the DASH eating plan, aim to eat more fresh fruits and vegetables, whole grains, lean proteins, low-fat dairy, and heart-healthy fats.  Work with your health care provider or diet and nutrition specialist (dietitian) to adjust your eating plan to your   individual calorie needs. This information is not intended to replace advice given to you by your health care provider. Make sure you discuss any questions you have with your health care provider. Document Revised: 08/27/2017 Document Reviewed: 09/07/2016 Elsevier Patient Education  2020 Elsevier Inc.  

## 2020-04-30 NOTE — Progress Notes (Signed)
Patient ID: Janet Knox, female    DOB: 1955/12/26  Age: 64 y.o. MRN: [id number]    Subjective:  Subjective  HPI Janet Knox presents for f/u bp and chol.  No complaints   Review of Systems  Constitutional: Negative for appetite change, diaphoresis, fatigue and unexpected weight change.  Eyes: Negative for pain, redness and visual disturbance.  Respiratory: Negative for cough, chest tightness, shortness of breath and wheezing.   Cardiovascular: Negative for chest pain, palpitations and leg swelling.  Endocrine: Negative for cold intolerance, heat intolerance, polydipsia, polyphagia and polyuria.  Genitourinary: Negative for difficulty urinating, dysuria and frequency.  Neurological: Negative for dizziness, light-headedness, numbness and headaches.    History Past Medical History:  Diagnosis Date  . Anemia   . Hyperlipidemia   . Hypertension   . Skin cancer     She has a past surgical history that includes Abdominal hysterectomy; Knee surgery; and Eye surgery (Left).   Her family history includes Alzheimer's disease in her mother; Coronary artery disease in her paternal grandfather; Hyperlipidemia in her father and mother; Hypertension in her father and mother; Stroke in her paternal grandfather.She reports that she has never smoked. She has never used smokeless tobacco. She reports current alcohol use of about 1.0 standard drink of alcohol per week. She reports that she does not use drugs.  Current Outpatient Medications on File Prior to Visit  Medication Sig Dispense Refill  . aspirin 81 MG tablet Take 81 mg by mouth daily.    Marland Kitchen atorvastatin (LIPITOR) 40 MG tablet Take 1 tablet (40 mg total) by mouth daily. 90 tablet 3  . Cholecalciferol (VITAMIN D3) 5000 UNITS TABS Take 1 tablet by mouth daily.     . Coenzyme Q10 (COQ-10) 200 MG CAPS Take 1 capsule by mouth daily.    Marland Kitchen estradiol (ESTRACE) 2 MG tablet Take 1 tablet (2 mg total) by mouth daily. Pt needs follow up for  further refills 90 tablet 0  . fenofibrate 54 MG tablet Take 1 tablet (54 mg total) by mouth daily. 90 tablet 1  . Ferrous Sulfate 27 MG TABS Take 1 tablet by mouth daily.    . metoprolol tartrate (LOPRESSOR) 50 MG tablet Take 1 tablet (50 mg total) by mouth 2 (two) times daily. Pt needs follow up for further refills 180 tablet 0  . Multiple Vitamin (MULTIVITAMIN) tablet Take 1 tablet by mouth daily.    . niacin 500 MG tablet Take 2 tablets (1,000 mg total) by mouth daily with breakfast. 180 tablet 3  . Omega-3 Fatty Acids (FISH OIL) 1200 MG CAPS Take 1 capsule by mouth daily.     . SUMAtriptan (IMITREX) 50 MG tablet Take 1 tablet (50 mg total) by mouth every 2 (two) hours as needed for migraine. May repeat in 2 hours if headache persists or recurs. 10 tablet 0  . vitamin E 400 UNIT capsule Take 400 Units by mouth daily.     No current facility-administered medications on file prior to visit.     Objective:  Objective  Physical Exam Vitals and nursing note reviewed.  Constitutional:      Appearance: She is well-developed.  HENT:     Head: Normocephalic and atraumatic.  Eyes:     Conjunctiva/sclera: Conjunctivae normal.  Neck:     Thyroid: No thyromegaly.     Vascular: No carotid bruit or JVD.  Cardiovascular:     Rate and Rhythm: Normal rate and regular rhythm.     Heart sounds:  Normal heart sounds. No murmur heard.   Pulmonary:     Effort: Pulmonary effort is normal. No respiratory distress.     Breath sounds: Normal breath sounds. No wheezing or rales.  Chest:     Chest wall: No tenderness.  Musculoskeletal:     Cervical back: Normal range of motion and neck supple.  Neurological:     Mental Status: She is alert and oriented to person, place, and time.    BP 140/90 (BP Location: Right Arm, Patient Position: Sitting, Cuff Size: Normal)   Pulse (!) 58   Temp 97.6 F (36.4 C) (Oral)   Resp 18   Ht 5\' 5"  (1.651 m)   Wt 148 lb 3.2 oz (67.2 kg)   SpO2 100%   BMI 24.66  kg/m  Wt Readings from Last 3 Encounters:  04/30/20 148 lb 3.2 oz (67.2 kg)  04/08/20 147 lb 12.8 oz (67 kg)  09/04/19 148 lb (67.1 kg)     Lab Results  Component Value Date   WBC 4.2 03/06/2019   HGB 11.9 (L) 03/06/2019   HCT 36.3 03/06/2019   PLT 236.0 03/06/2019   GLUCOSE 86 04/30/2020   CHOL 166 04/30/2020   TRIG 82.0 04/30/2020   HDL 63.80 04/30/2020   LDLDIRECT 117.5 01/27/2010   LDLCALC 85 04/30/2020   ALT 14 04/30/2020   AST 21 04/30/2020   NA 137 04/30/2020   K 4.5 04/30/2020   CL 103 04/30/2020   CREATININE 0.62 04/30/2020   BUN 16 04/30/2020   CO2 29 04/30/2020   TSH 4.35 03/06/2019   MICROALBUR 0.7 03/22/2013    US THYROID  Result Date: 05/03/2019 CLINICAL DATA:  Thyroid nodules EXAM: THYROID ULTRASOUND TECHNIQUE: Ultrasound examination of the thyroid gland and adjacent soft tissues was performed. COMPARISON:  04/06/2018 FINDINGS: Parenchymal Echotexture: Mildly heterogenous Isthmus: 1 mm Right lobe: 3.9 x 1.1 x 1.3 cm, previously 3.9 x 1.4 x 1.2 cm Left lobe: 3.1 x 0.8 x 1.1 cm, previously 3.0 x 1.1 x 0.8 cm _________________________________________________________ Estimated total number of nodules >/= 1 cm: 2 Number of spongiform nodules >/=  2 cm not described below (TR1): 0 Number of mixed cystic and solid nodules >/= 1.5 cm not described below ([place]): 0 _________________________________________________________ Nodule # 2: Location: Right; Mid Maximum size: 1.6, previously 1.5 cm; Other 2 dimensions: 1.0 x 0.6 cm Composition: mixed cystic and solid (1) Echogenicity: isoechoic (1) Shape: not taller-than-wide (0) Margins: smooth (0) Echogenic foci: none (0) ACR TI-RADS total points: 2. ACR TI-RADS risk category: TR2 (2 points). ACR TI-RADS recommendations: This nodule does NOT meet TI-RADS criteria for biopsy or dedicated follow-up. This nodule is now more cystic, and has been down graded and no longer warrants follow-up  _________________________________________________________ There are additional bilateral isoechoic and mixed cystic/solid nodules noted all measuring 1 cm or less in size and would not meet criteria for any biopsy or follow-up. No hypervascularity.  No regional adenopathy. IMPRESSION: Stable 1.6 cm right mid thyroid TR 2 nodule previously TR 3. This nodule has become more cystic and down graded. No further follow-up or biopsy warranted. Stable additional subcentimeter nodules. No new finding. The above is in keeping with the ACR TI-RADS recommendations - J Am Coll Radiol 2017;14:587-595. Electronically Signed   By: Jerilynn Mages.  Shick M.D.   On: 05/03/2019 09:39     Assessment & Plan:  Plan  I am having Janet Knox "Butch Penny" maintain her vitamin E, multivitamin, aspirin, CoQ-10, Fish Oil, Vitamin D3, niacin, Ferrous Sulfate, atorvastatin,  estradiol, metoprolol tartrate, fenofibrate, SUMAtriptan, and losartan.  Meds ordered this encounter  Medications  . DISCONTD: losartan (COZAAR) 100 MG tablet    Sig: Take 1 tablet (100 mg total) by mouth daily.    Dispense:  90 tablet    Refill:  3  . losartan (COZAAR) 100 MG tablet    Sig: Take 1 tablet (100 mg total) by mouth daily.    Dispense:  30 tablet    Refill:  0    Problem List Items Addressed This Visit      Unprioritized   Essential hypertension - Primary    Poorly controlled will alter medications, encouraged DASH diet, minimize caffeine and obtain adequate sleep. Report concerning symptoms and follow up as directed and as needed      Relevant Medications   losartan (COZAAR) 100 MG tablet   Other Relevant Orders   Lipid panel (Completed)   Comprehensive metabolic panel (Completed)   Hyperlipidemia LDL goal <70    Encouraged heart healthy diet, increase exercise, avoid trans fats, consider a krill oil cap daily      Relevant Medications   losartan (COZAAR) 100 MG tablet    Other Visit Diagnoses    Hyperlipidemia associated with type 2  diabetes mellitus (HCC)       Relevant Medications   losartan (COZAAR) 100 MG tablet   Other Relevant Orders   Lipid panel (Completed)   Comprehensive metabolic panel (Completed)      Follow-up: Return if symptoms worsen or fail to improve.  Ann Held, DO

## 2020-05-05 NOTE — Assessment & Plan Note (Signed)
Poorly controlled will alter medications, encouraged DASH diet, minimize caffeine and obtain adequate sleep. Report concerning symptoms and follow up as directed and as needed 

## 2020-05-05 NOTE — Assessment & Plan Note (Signed)
Encouraged heart healthy diet, increase exercise, avoid trans fats, consider a krill oil cap daily 

## 2020-06-22 ENCOUNTER — Other Ambulatory Visit: Payer: Self-pay | Admitting: Family Medicine

## 2020-06-22 DIAGNOSIS — Z0000 Encounter for general adult medical examination without abnormal findings: Secondary | ICD-10-CM

## 2020-06-22 DIAGNOSIS — I10 Essential (primary) hypertension: Secondary | ICD-10-CM

## 2020-06-22 DIAGNOSIS — Z780 Asymptomatic menopausal state: Secondary | ICD-10-CM

## 2020-07-12 ENCOUNTER — Encounter: Payer: Self-pay | Admitting: Family Medicine

## 2020-07-12 NOTE — Telephone Encounter (Signed)
Please advise. Urgent care?

## 2020-07-12 NOTE — Telephone Encounter (Signed)
Can we check UA ? Other option is calling her gyn

## 2020-07-16 ENCOUNTER — Other Ambulatory Visit: Payer: Self-pay

## 2020-07-16 ENCOUNTER — Encounter: Payer: Self-pay | Admitting: Family Medicine

## 2020-07-16 ENCOUNTER — Ambulatory Visit (INDEPENDENT_AMBULATORY_CARE_PROVIDER_SITE_OTHER): Payer: Medicare Other | Admitting: Family Medicine

## 2020-07-16 VITALS — BP 130/90 | HR 70 | Temp 97.9°F | Resp 18 | Ht 65.0 in | Wt 140.6 lb

## 2020-07-16 DIAGNOSIS — R300 Dysuria: Secondary | ICD-10-CM

## 2020-07-16 DIAGNOSIS — I10 Essential (primary) hypertension: Secondary | ICD-10-CM

## 2020-07-16 DIAGNOSIS — Z0000 Encounter for general adult medical examination without abnormal findings: Secondary | ICD-10-CM

## 2020-07-16 DIAGNOSIS — Z8669 Personal history of other diseases of the nervous system and sense organs: Secondary | ICD-10-CM

## 2020-07-16 DIAGNOSIS — R8290 Unspecified abnormal findings in urine: Secondary | ICD-10-CM

## 2020-07-16 DIAGNOSIS — Z780 Asymptomatic menopausal state: Secondary | ICD-10-CM | POA: Diagnosis not present

## 2020-07-16 DIAGNOSIS — E785 Hyperlipidemia, unspecified: Secondary | ICD-10-CM

## 2020-07-16 DIAGNOSIS — Z136 Encounter for screening for cardiovascular disorders: Secondary | ICD-10-CM | POA: Diagnosis not present

## 2020-07-16 DIAGNOSIS — Z23 Encounter for immunization: Secondary | ICD-10-CM

## 2020-07-16 LAB — POC URINALSYSI DIPSTICK (AUTOMATED)
Bilirubin, UA: NEGATIVE
Clarity, UA: NEGATIVE
Glucose, UA: NEGATIVE
Ketones, UA: NEGATIVE
Leukocytes, UA: NEGATIVE
Nitrite, UA: NEGATIVE
Protein, UA: NEGATIVE
Spec Grav, UA: >=1.03 — AB (ref 1.010–1.025)
Urobilinogen, UA: 0.2 E.U./dL
pH, UA: 5 (ref 5.0–8.0)

## 2020-07-16 MED ORDER — SUMATRIPTAN SUCCINATE 50 MG PO TABS: 50.0000 mg | ORAL_TABLET | ORAL | 1 refills | Status: DC | PRN

## 2020-07-16 MED ORDER — ATORVASTATIN CALCIUM 40 MG PO TABS: 40.0000 mg | ORAL_TABLET | Freq: Every day | ORAL | 3 refills | Status: DC

## 2020-07-16 MED ORDER — LOSARTAN POTASSIUM 100 MG PO TABS: 100.0000 mg | ORAL_TABLET | Freq: Every day | ORAL | 1 refills | Status: DC

## 2020-07-16 MED ORDER — METOPROLOL TARTRATE 50 MG PO TABS: 50.0000 mg | ORAL_TABLET | Freq: Two times a day (BID) | ORAL | 1 refills | Status: DC

## 2020-07-16 MED ORDER — ESTRADIOL 2 MG PO TABS: 2.0000 mg | ORAL_TABLET | Freq: Every day | ORAL | 1 refills | Status: DC

## 2020-07-16 MED ORDER — FENOFIBRATE 54 MG PO TABS: 54.0000 mg | ORAL_TABLET | Freq: Every day | ORAL | 1 refills | Status: DC

## 2020-07-16 NOTE — Assessment & Plan Note (Signed)
Tolerating statin, encouraged heart healthy diet, avoid trans fats, minimize simple carbs and saturated fats. Increase exercise as tolerated con't lipitor and fenofibrate

## 2020-07-16 NOTE — Progress Notes (Signed)
Subjective:    Janet Knox is a 64 y.o. female who presents for a Welcome to Medicare exam.    She also need f/u chol and bp.   No complaints   Review of Systems  Review of Systems  Constitutional: Negative for activity change, appetite change and fatigue.  HENT: Negative for hearing loss, congestion, tinnitus and ear discharge.   Eyes: Negative for visual disturbance (see optho q1y -- vision corrected to 20/20 with glasses).  Respiratory: Negative for cough, chest tightness and shortness of breath.   Cardiovascular: Negative for chest pain, palpitations and leg swelling.  Gastrointestinal: Negative for abdominal pain, diarrhea, constipation and abdominal distention.  Genitourinary: Negative for urgency, frequency, decreased urine volume and difficulty urinating.  Musculoskeletal: Negative for back pain, arthralgias and gait problem.  Skin: Negative for color change, pallor and rash.  Neurological: Negative for dizziness, light-headedness, numbness and headaches.  Hematological: Negative for adenopathy. Does not bruise/bleed easily.  Psychiatric/Behavioral: Negative for suicidal ideas, confusion, sleep disturbance, self-injury, dysphoric mood, decreased concentration and agitation.  Pt is able to read and write and can do all ADLs No risk for falling No abuse/ violence in home          Objective:    Today's Vitals   07/16/20 1015  BP: 130/90  Pulse: 70  Resp: 18  Temp: 97.9 F (36.6 C)  TempSrc: Oral  SpO2: 98%  Weight: 140 lb 9.6 oz (63.8 kg)  Height: 5\' 5"  (1.651 m)  Body mass index is 23.4 kg/m.  Medications Outpatient Encounter Medications as of 07/16/2020  Medication Sig  . aspirin 81 MG tablet Take 81 mg by mouth daily.  Marland Kitchen atorvastatin (LIPITOR) 40 MG tablet Take 1 tablet (40 mg total) by mouth daily.  . Cholecalciferol (VITAMIN D3) 5000 UNITS TABS Take 1 tablet by mouth daily.   . Coenzyme Q10 (COQ-10) 200 MG CAPS Take 1 capsule by mouth daily.  Marland Kitchen  estradiol (ESTRACE) 2 MG tablet Take 1 tablet (2 mg total) by mouth daily.  . fenofibrate 54 MG tablet Take 1 tablet (54 mg total) by mouth daily.  . Ferrous Sulfate 27 MG TABS Take 1 tablet by mouth daily.  Marland Kitchen losartan (COZAAR) 100 MG tablet Take 1 tablet (100 mg total) by mouth daily.  . metoprolol tartrate (LOPRESSOR) 50 MG tablet Take 1 tablet (50 mg total) by mouth 2 (two) times daily.  . Multiple Vitamin (MULTIVITAMIN) tablet Take 1 tablet by mouth daily.  . niacin 500 MG tablet Take 2 tablets (1,000 mg total) by mouth daily with breakfast.  . Omega-3 Fatty Acids (FISH OIL) 1200 MG CAPS Take 1 capsule by mouth daily.   . SUMAtriptan (IMITREX) 50 MG tablet Take 1 tablet (50 mg total) by mouth every 2 (two) hours as needed for migraine. May repeat in 2 hours if headache persists or recurs.  . vitamin E 400 UNIT capsule Take 400 Units by mouth daily.  . [DISCONTINUED] atorvastatin (LIPITOR) 40 MG tablet Take 1 tablet (40 mg total) by mouth daily.  . [DISCONTINUED] estradiol (ESTRACE) 2 MG tablet Take 1 tablet (2 mg total) by mouth daily.  . [DISCONTINUED] fenofibrate 54 MG tablet Take 1 tablet (54 mg total) by mouth daily.  . [DISCONTINUED] losartan (COZAAR) 100 MG tablet Take 1 tablet (100 mg total) by mouth daily.  . [DISCONTINUED] metoprolol tartrate (LOPRESSOR) 50 MG tablet Take 1 tablet (50 mg total) by mouth 2 (two) times daily.  . [DISCONTINUED] SUMAtriptan (IMITREX) 50 MG tablet  Take 1 tablet (50 mg total) by mouth every 2 (two) hours as needed for migraine. May repeat in 2 hours if headache persists or recurs.   No facility-administered encounter medications on file as of 07/16/2020.     History: Past Medical History:  Diagnosis Date  . Anemia   . Hyperlipidemia   . Hypertension   . Skin cancer    Past Surgical History:  Procedure Laterality Date  . ABDOMINAL HYSTERECTOMY    . EYE SURGERY Left    Torn Retina  . KNEE SURGERY      Family History  Problem Relation Age of  Onset  . Alzheimer's disease Mother   . Hyperlipidemia Mother   . Hypertension Mother   . Coronary artery disease Paternal Grandfather   . Stroke Paternal Grandfather   . Hyperlipidemia Father   . Hypertension Father    Social History   Occupational History  . Occupation: [employer]  Tobacco Use  . Smoking status: Never Smoker  . Smokeless tobacco: Never Used  Vaping Use  . Vaping Use: Never used  Substance and Sexual Activity  . Alcohol use: Yes    Alcohol/week: 1.0 standard drink    Types: 1 Glasses of wine per week  . Drug use: No  . Sexual activity: Yes    Partners: Male    Tobacco Counseling Counseling given: Not Answered   Immunizations and Health Maintenance Immunization History  Administered Date(s) Administered  . Influenza Whole 07/26/2007, 07/20/2008  . Influenza,inj,Quad PF,6+ Mos 07/26/2015, 06/23/2016, 07/15/2017, 08/05/2018, 06/21/2019, 07/16/2020  . Moderna SARS-COVID-2 Vaccination 12/09/2019, 01/09/2020  . Pneumococcal Polysaccharide-23 12/31/2015  . Td 11/03/2002  . Tdap 03/22/2013  . Zoster 08/13/2015  . Zoster Recombinat (Shingrix) 01/05/2017, 07/01/2017   Health Maintenance Due  Topic Date Due  . DEXA SCAN  03/19/2017  . MAMMOGRAM  06/23/2019    Activities of Daily Living In your present state of health, do you have any difficulty performing the following activities: 07/16/2020 04/30/2020  Hearing? N N  Vision? N N  Difficulty concentrating or making decisions? N N  Walking or climbing stairs? N N  Dressing or bathing? N N  Doing errands, shopping? N N  Some recent data might be hidden    Physical Exam  BP 130/90 (BP Location: Right Arm, Patient Position: Sitting, Cuff Size: Normal)   Pulse 70   Temp 97.9 F (36.6 C) (Oral)   Resp 18   Ht 5\' 5"  (1.651 m)   Wt 140 lb 9.6 oz (63.8 kg)   SpO2 98%   BMI 23.40 kg/m  General appearance: alert, cooperative, appears stated age and no distress Head: Normocephalic, without obvious  abnormality, atraumatic Eyes: negative findings: lids and lashes normal, conjunctivae and sclerae normal and pupils equal, round, reactive to light and accomodation Ears: normal TM's and external ear canals both ears Neck: no adenopathy, no carotid bruit, no JVD, supple, symmetrical, trachea midline and thyroid not enlarged, symmetric, no tenderness/mass/nodules Back: symmetric, no curvature. ROM normal. No CVA tenderness. Lungs: clear to auscultation bilaterally Heart: regular rate and rhythm, S1, S2 normal, no murmur, click, rub or gallop Abdomen: soft, non-tender; bowel sounds normal; no masses,  no organomegaly Pelvic: not indicated; status post hysterectomy, negative ROS Extremities: extremities normal, atraumatic, no cyanosis or edema Pulses: 2+ and symmetric Skin: Skin color, texture, turgor normal. No rashes or lesions Lymph nodes: Cervical, supraclavicular, and axillary nodes normal. Neurologic: Alert and oriented X 3, normal strength and tone. Normal symmetric reflexes. Normal coordination and  gait(optional), or other factors deemed appropriate based on the beneficiary's medical and social history and current clinical standards  Advanced Directives: Does Patient Have a Medical Advance Directive?: Yes    Assessment:    This is a routine wellness examination for this patient .    Vision/Hearing screen  Hearing Screening   125Hz  250Hz  500Hz  1000Hz  2000Hz  3000Hz  4000Hz  6000Hz  8000Hz   Right ear:           Left ear:           Comments: Normal whisper test   Visual Acuity Screening   Right eye Left eye Both eyes  Without correction:     With correction: 20/20 20/20 20/20     Dietary issues and exercise activities discussed:  Current Exercise Habits: Home exercise routine, Type of exercise: walking, Time (Minutes): 45, Frequency (Times/Week): 3, Weekly Exercise (Minutes/Week): 135, Intensity: Moderate, Exercise limited by: orthopedic condition(s) (twisted ankle last  week)  Goals   None    Depression Screen PHQ 2/9 Scores 07/16/2020 04/30/2020 01/13/2018 06/23/2016  PHQ - 2 Score 0 0 0 0  PHQ- 9 Score 0 - - -     Fall Risk Fall Risk  07/16/2020  Falls in the past year? 0  Number falls in past yr: 0  Injury with Fall? 0  Follow up Falls evaluation completed    Cognitive Function: MMSE - Mini Mental State Exam 07/16/2020  Orientation to time 5  Orientation to Place 5  Registration 3  Attention/ Calculation 5  Recall 3  Language- name 2 objects 2  Language- repeat 1  Language- follow 3 step command 3  Language- read & follow direction 1  Write a sentence 1  Copy design 1  Total score 30        Patient Care Team: Ann Held, DO as PCP - General Maisie Fus, MD as Consulting Physician (Obstetrics and Gynecology) Sherlynn Stalls, MD as Consulting Physician (Ophthalmology) Barbaraann Cao, OD as Referring Physician (Optometry) Darleen Crocker, MD as Consulting Physician (Ophthalmology)     Plan:    1. Hyperlipidemia, unspecified hyperlipidemia type Tolerating statin, encouraged heart healthy diet, avoid trans fats, minimize simple carbs and saturated fats. Increase exercise as tolerated - atorvastatin (LIPITOR) 40 MG tablet; Take 1 tablet (40 mg total) by mouth daily.  Dispense: 90 tablet; Refill: 3 - Comprehensive metabolic panel - Lipid panel  2. Menopause   - estradiol (ESTRACE) 2 MG tablet; Take 1 tablet (2 mg total) by mouth daily.  Dispense: 90 tablet; Refill: 1  3. Preventative health care See above  - estradiol (ESTRACE) 2 MG tablet; Take 1 tablet (2 mg total) by mouth daily.  Dispense: 90 tablet; Refill: 1 - fenofibrate 54 MG tablet; Take 1 tablet (54 mg total) by mouth daily.  Dispense: 90 tablet; Refill: 1  4. Essential hypertension Well controlled, no changes to meds. Encouraged heart healthy diet such as the DASH diet and exercise as tolerated.  - losartan (COZAAR) 100 MG tablet; Take 1 tablet  (100 mg total) by mouth daily.  Dispense: 90 tablet; Refill: 1 - metoprolol tartrate (LOPRESSOR) 50 MG tablet; Take 1 tablet (50 mg total) by mouth 2 (two) times daily.  Dispense: 180 tablet; Refill: 1  5. History of migraine  - SUMAtriptan (IMITREX) 50 MG tablet; Take 1 tablet (50 mg total) by mouth every 2 (two) hours as needed for migraine. May repeat in 2 hours if headache persists or recurs.  Dispense: 10 tablet; Refill: 1  6. Need for influenza vaccination  - Flu Vaccine QUAD 36+ mos IM  7. Welcome to Medicare preventive visit  - EKG 12-Lead  8. Dysuria Recheck urine  - POCT Urinalysis Dipstick (Automated) - Urine Culture  9. Encounter for Medicare annual wellness exam    10. Influenza vaccine administered    11. Abnormal urine   - Urine Culture  12. Hyperlipidemia LDL goal <70 Tolerating statin, encouraged heart healthy diet, avoid trans fats, minimize simple carbs and saturated fats. Increase exercise as tolerated  I have personally reviewed and noted the following in the patient's chart:   . Medical and social history . Use of alcohol, tobacco or illicit drugs  . Current medications and supplements . Functional ability and status . Nutritional status . Physical activity . Advanced directives . List of other physicians . Hospitalizations, surgeries, and ER visits in previous 12 months . Vitals . Screenings to include cognitive, depression, and falls . Referrals and appointments  In addition, I have reviewed and discussed with patient certain preventive protocols, quality metrics, and best practice recommendations. A written personalized care plan for preventive services as well as general preventive health recommendations were provided to patient.     [place], DO 07/16/2020

## 2020-07-16 NOTE — Patient Instructions (Signed)

## 2020-07-16 NOTE — Telephone Encounter (Signed)
Pt had visit today.

## 2020-07-16 NOTE — Assessment & Plan Note (Addendum)
Well controlled, no changes to meds. Encouraged heart healthy diet such as the DASH diet and exercise as tolerated.  con't losartan

## 2020-07-17 ENCOUNTER — Other Ambulatory Visit: Payer: Self-pay | Admitting: *Deleted

## 2020-07-17 DIAGNOSIS — E785 Hyperlipidemia, unspecified: Secondary | ICD-10-CM

## 2020-07-17 LAB — COMPREHENSIVE METABOLIC PANEL
AG Ratio: 1.7 (calc) (ref 1.0–2.5)
ALT: 15 U/L (ref 6–29)
AST: 23 U/L (ref 10–35)
Albumin: 4.3 g/dL (ref 3.6–5.1)
Alkaline phosphatase (APISO): 52 U/L (ref 37–153)
BUN: 14 mg/dL (ref 7–25)
CO2: 25 mmol/L (ref 20–32)
Calcium: 9.4 mg/dL (ref 8.6–10.4)
Chloride: 105 mmol/L (ref 98–110)
Creat: 0.63 mg/dL (ref 0.50–0.99)
Globulin: 2.5 g/dL (calc) (ref 1.9–3.7)
Glucose, Bld: 86 mg/dL (ref 65–99)
Potassium: 4.7 mmol/L (ref 3.5–5.3)
Sodium: 139 mmol/L (ref 135–146)
Total Bilirubin: 0.6 mg/dL (ref 0.2–1.2)
Total Protein: 6.8 g/dL (ref 6.1–8.1)

## 2020-07-17 LAB — LIPID PANEL
Cholesterol: 184 mg/dL (ref <200)
HDL: 72 mg/dL (ref >=50)
LDL Cholesterol (Calc): 93 mg/dL (calc)
Non-HDL Cholesterol (Calc): 112 mg/dL (calc) (ref <130)
Total CHOL/HDL Ratio: 2.6 (calc) (ref <5.0)
Triglycerides: 94 mg/dL (ref <150)

## 2020-07-17 LAB — URINE CULTURE
MICRO NUMBER:: 11090043
Result:: NO GROWTH
SPECIMEN QUALITY:: ADEQUATE

## 2020-07-17 MED ORDER — ATORVASTATIN CALCIUM 40 MG PO TABS: 40.0000 mg | ORAL_TABLET | Freq: Every day | ORAL | 3 refills | Status: DC

## 2020-07-22 ENCOUNTER — Encounter: Payer: Self-pay | Admitting: Family Medicine

## 2020-07-22 DIAGNOSIS — Z13820 Encounter for screening for osteoporosis: Secondary | ICD-10-CM

## 2020-07-22 DIAGNOSIS — Z1231 Encounter for screening mammogram for malignant neoplasm of breast: Secondary | ICD-10-CM

## 2020-07-22 DIAGNOSIS — E2839 Other primary ovarian failure: Secondary | ICD-10-CM

## 2020-07-22 NOTE — Telephone Encounter (Signed)
Okay to place these referrals?

## 2020-07-22 NOTE — Telephone Encounter (Signed)
yes

## 2020-08-26 ENCOUNTER — Ambulatory Visit: Payer: Medicare Other | Attending: Internal Medicine

## 2020-08-26 ENCOUNTER — Other Ambulatory Visit (HOSPITAL_BASED_OUTPATIENT_CLINIC_OR_DEPARTMENT_OTHER): Payer: Self-pay | Admitting: Internal Medicine

## 2020-08-26 DIAGNOSIS — Z23 Encounter for immunization: Secondary | ICD-10-CM

## 2020-08-26 MED FILL — MODERNA COVID-19 VACCINE 10: 100 | 1 days supply | Qty: 0 | Fill #0

## 2020-08-26 NOTE — Progress Notes (Signed)
   Cov[facility] Vaccination [facility]  Name:  Janet Knox    MRN: [id number] DOB: [birth year]  08/26/2020  Janet Knox was observed post Covid-19 immunization for 15 minutes without incident. She was provided with Vaccine Information Sheet and instruction to access the V-Safe system.   Janet Knox was instructed to call 911 with any severe reactions post vaccine: Marland Kitchen Difficulty breathing  . Swelling of face and throat  . A fast heartbeat  . A bad rash all over body  . Dizziness and weakness   Immunizations Administered    No immunizations on file.

## 2020-10-09 LAB — HM MAMMOGRAPHY

## 2020-10-09 LAB — HM DEXA SCAN

## 2020-10-11 ENCOUNTER — Encounter: Payer: Self-pay | Admitting: Family Medicine

## 2020-10-15 ENCOUNTER — Encounter: Payer: Self-pay | Admitting: Family Medicine

## 2020-10-16 ENCOUNTER — Encounter: Payer: Self-pay | Admitting: Family Medicine

## 2020-10-16 NOTE — Telephone Encounter (Signed)
Can it be scanned into epic under imaging ?

## 2020-10-16 NOTE — Telephone Encounter (Signed)
Bone density abstracted and sent for scanning.

## 2020-12-11 ENCOUNTER — Other Ambulatory Visit: Payer: Self-pay | Admitting: Family Medicine

## 2020-12-11 DIAGNOSIS — Z0000 Encounter for general adult medical examination without abnormal findings: Secondary | ICD-10-CM

## 2020-12-11 DIAGNOSIS — I10 Essential (primary) hypertension: Secondary | ICD-10-CM

## 2020-12-20 ENCOUNTER — Other Ambulatory Visit: Payer: Self-pay | Admitting: Family Medicine

## 2020-12-20 DIAGNOSIS — I10 Essential (primary) hypertension: Secondary | ICD-10-CM

## 2020-12-20 DIAGNOSIS — Z0000 Encounter for general adult medical examination without abnormal findings: Secondary | ICD-10-CM

## 2020-12-27 ENCOUNTER — Other Ambulatory Visit: Payer: Self-pay

## 2020-12-27 ENCOUNTER — Ambulatory Visit (INDEPENDENT_AMBULATORY_CARE_PROVIDER_SITE_OTHER): Payer: Medicare Other | Admitting: Family Medicine

## 2020-12-27 ENCOUNTER — Encounter: Payer: Self-pay | Admitting: Family Medicine

## 2020-12-27 VITALS — BP 118/80 | HR 61 | Temp 98.2°F | Resp 18 | Ht 65.0 in | Wt 145.4 lb

## 2020-12-27 DIAGNOSIS — E785 Hyperlipidemia, unspecified: Secondary | ICD-10-CM | POA: Diagnosis not present

## 2020-12-27 DIAGNOSIS — I10 Essential (primary) hypertension: Secondary | ICD-10-CM | POA: Diagnosis not present

## 2020-12-27 DIAGNOSIS — Z23 Encounter for immunization: Secondary | ICD-10-CM

## 2020-12-27 DIAGNOSIS — E042 Nontoxic multinodular goiter: Secondary | ICD-10-CM | POA: Diagnosis not present

## 2020-12-27 LAB — COMPREHENSIVE METABOLIC PANEL
ALT: 14 U/L (ref 0–35)
AST: 21 U/L (ref 0–37)
Albumin: 4.1 g/dL (ref 3.5–5.2)
Alkaline Phosphatase: 49 U/L (ref 39–117)
BUN: 21 mg/dL (ref 6–23)
CO2: 27 mEq/L (ref 19–32)
Calcium: 9 mg/dL (ref 8.4–10.5)
Chloride: 105 mEq/L (ref 96–112)
Creatinine, Ser: 0.61 mg/dL (ref 0.40–1.20)
GFR: 93.81 mL/min (ref >60.00)
Glucose, Bld: 94 mg/dL (ref 70–99)
Potassium: 4.7 mEq/L (ref 3.5–5.1)
Sodium: 139 mEq/L (ref 135–145)
Total Bilirubin: 0.6 mg/dL (ref 0.2–1.2)
Total Protein: 6.5 g/dL (ref 6.0–8.3)

## 2020-12-27 LAB — LIPID PANEL
Cholesterol: 162 mg/dL (ref 0–200)
HDL: 60.8 mg/dL (ref >39.00)
LDL Cholesterol: 85 mg/dL (ref 0–99)
NonHDL: 101.22
Total CHOL/HDL Ratio: 3
Triglycerides: 83 mg/dL (ref 0.0–149.0)
VLDL: 16.6 mg/dL (ref 0.0–40.0)

## 2020-12-27 NOTE — Assessment & Plan Note (Signed)
Well controlled, no changes to meds. Encouraged heart healthy diet such as the DASH diet and exercise as tolerated.  °

## 2020-12-27 NOTE — Assessment & Plan Note (Signed)
F/u endo  

## 2020-12-27 NOTE — Assessment & Plan Note (Signed)
Encouraged heart healthy diet, increase exercise, avoid trans fats, consider a krill oil cap daily 

## 2020-12-27 NOTE — Patient Instructions (Signed)

## 2020-12-27 NOTE — Progress Notes (Signed)
Patient ID: Janet Knox, female    DOB: 08-10-1956  Age: 65 y.o. MRN: [id number]    Subjective:  Subjective  HPI Janet Knox presents for an office visit today. She reports feeling well. She states she is has been as active as before due to taking care of her mother in law and her father, as well as working full time. She has a FMHx of heart disease and is requesting preventive care.She agrees to an influenza vaccine today. She denies any chest pain, SOB, fever, abdominal pain, cough, chills, sore throat, dysuria, urinary incontinence, back pain, HA, or N/VD at this time.   Review of Systems  Constitutional: Negative for appetite change, chills, diaphoresis, fatigue, fever and unexpected weight change.  HENT: Negative for ear pain, hearing loss, sinus pain and sore throat.   Eyes: Negative for pain, redness and visual disturbance.  Respiratory: Negative for cough, chest tightness, shortness of breath and wheezing.   Cardiovascular: Negative for chest pain, palpitations and leg swelling.  Gastrointestinal: Negative for abdominal pain, blood in stool, constipation, diarrhea, nausea and vomiting.  Endocrine: Negative for cold intolerance, heat intolerance, polydipsia, polyphagia and polyuria.  Genitourinary: Negative for difficulty urinating, dysuria, frequency, hematuria and urgency.  Neurological: Negative for dizziness, light-headedness, numbness and headaches.    History Past Medical History:  Diagnosis Date  . Anemia   . Hyperlipidemia   . Hypertension   . Skin cancer     She has a past surgical history that includes Abdominal hysterectomy; Knee surgery; and Eye surgery (Left).   Her family history includes Alzheimer's disease in her mother; Atrial fibrillation in her father; Coronary artery disease in her paternal grandfather; Hyperlipidemia in her father and mother; Hypertension in her father and mother; Stroke in her paternal grandfather.She reports that she has never  smoked. She has never used smokeless tobacco. She reports current alcohol use of about 1.0 standard drink of alcohol per week. She reports that she does not use drugs.  Current Outpatient Medications on File Prior to Visit  Medication Sig Dispense Refill  . aspirin 81 MG tablet Take 81 mg by mouth daily.    Marland Kitchen atorvastatin (LIPITOR) 40 MG tablet Take 1 tablet (40 mg total) by mouth daily. 90 tablet 3  . Cholecalciferol (VITAMIN D3) 5000 UNITS TABS Take 1 tablet by mouth daily.     . Coenzyme Q10 (COQ-10) 200 MG CAPS Take 1 capsule by mouth daily.    Marland Kitchen estradiol (ESTRACE) 2 MG tablet Take 1 tablet (2 mg total) by mouth daily. 90 tablet 1  . fenofibrate 54 MG tablet TAKE 1 TABLET DAILY 90 tablet 1  . Ferrous Sulfate 27 MG TABS Take 1 tablet by mouth daily.    Marland Kitchen losartan (COZAAR) 100 MG tablet TAKE 1 TABLET DAILY 90 tablet 1  . metoprolol tartrate (LOPRESSOR) 50 MG tablet TAKE 1 TABLET TWICE A DAY 180 tablet 1  . Multiple Vitamin (MULTIVITAMIN) tablet Take 1 tablet by mouth daily.    . niacin 500 MG tablet Take 2 tablets (1,000 mg total) by mouth daily with breakfast. 180 tablet 3  . Omega-3 Fatty Acids (FISH OIL) 1200 MG CAPS Take 1 capsule by mouth daily.     . SUMAtriptan (IMITREX) 50 MG tablet Take 1 tablet (50 mg total) by mouth every 2 (two) hours as needed for migraine. May repeat in 2 hours if headache persists or recurs. 10 tablet 1  . vitamin E 400 UNIT capsule Take 400 Units by mouth daily.  No current facility-administered medications on file prior to visit.     Objective:  Objective  Physical Exam Vitals and nursing note reviewed.  Constitutional:      General: She is not in acute distress.    Appearance: Normal appearance. She is well-developed. She is not ill-appearing.  HENT:     Head: Normocephalic and atraumatic.     Right Ear: External ear normal.     Left Ear: External ear normal.     Nose: Nose normal.  Eyes:     Extraocular Movements: Extraocular movements  intact.     Pupils: Pupils are equal, round, and reactive to light.  Cardiovascular:     Rate and Rhythm: Normal rate and regular rhythm.     Pulses: Normal pulses.     Heart sounds: Normal heart sounds. No murmur heard. No friction rub. No gallop.   Pulmonary:     Effort: Pulmonary effort is normal. No respiratory distress.     Breath sounds: Normal breath sounds. No wheezing, rhonchi or rales.  Abdominal:     General: Bowel sounds are normal. There is no distension.     Palpations: Abdomen is soft.     Tenderness: There is no abdominal tenderness. There is no guarding.     Hernia: No hernia is present.  Musculoskeletal:        General: Normal range of motion.     Cervical back: Normal range of motion and neck supple.  Skin:    General: Skin is warm and dry.  Neurological:     Mental Status: She is alert and oriented to person, place, and time.  Psychiatric:        Behavior: Behavior normal.        Thought Content: Thought content normal.    BP 118/80 (BP Location: Right Arm, Patient Position: Sitting, Cuff Size: Normal)   Pulse 61   Temp 98.2 F (36.8 C) (Oral)   Resp 18   Ht 5\' 5"  (1.651 m)   Wt 145 lb 6.4 oz (66 kg)   SpO2 100%   BMI 24.20 kg/m  Wt Readings from Last 3 Encounters:  12/27/20 145 lb 6.4 oz (66 kg)  07/16/20 140 lb 9.6 oz (63.8 kg)  04/30/20 148 lb 3.2 oz (67.2 kg)     Lab Results  Component Value Date   WBC 4.2 03/06/2019   HGB 11.9 (L) 03/06/2019   HCT 36.3 03/06/2019   PLT 236.0 03/06/2019   GLUCOSE 86 07/16/2020   CHOL 184 07/16/2020   TRIG 94 07/16/2020   HDL 72 07/16/2020   LDLDIRECT 117.5 01/27/2010   LDLCALC 93 07/16/2020   ALT 15 07/16/2020   AST 23 07/16/2020   NA 139 07/16/2020   K 4.7 07/16/2020   CL 105 07/16/2020   CREATININE 0.63 07/16/2020   BUN 14 07/16/2020   CO2 25 07/16/2020   TSH 4.35 03/06/2019   MICROALBUR 0.7 03/22/2013    US THYROID  Result Date: 05/03/2019 CLINICAL DATA:  Thyroid nodules EXAM: THYROID  ULTRASOUND TECHNIQUE: Ultrasound examination of the thyroid gland and adjacent soft tissues was performed. COMPARISON:  04/06/2018 FINDINGS: Parenchymal Echotexture: Mildly heterogenous Isthmus: 1 mm Right lobe: 3.9 x 1.1 x 1.3 cm, previously 3.9 x 1.4 x 1.2 cm Left lobe: 3.1 x 0.8 x 1.1 cm, previously 3.0 x 1.1 x 0.8 cm _________________________________________________________ Estimated total number of nodules >/= 1 cm: 2 Number of spongiform nodules >/=  2 cm not described below (TR1): 0 Number of mixed  cystic and solid nodules >/= 1.5 cm not described below ([place]): 0 _________________________________________________________ Nodule # 2: Location: Right; Mid Maximum size: 1.6, previously 1.5 cm; Other 2 dimensions: 1.0 x 0.6 cm Composition: mixed cystic and solid (1) Echogenicity: isoechoic (1) Shape: not taller-than-wide (0) Margins: smooth (0) Echogenic foci: none (0) ACR TI-RADS total points: 2. ACR TI-RADS risk category: TR2 (2 points). ACR TI-RADS recommendations: This nodule does NOT meet TI-RADS criteria for biopsy or dedicated follow-up. This nodule is now more cystic, and has been down graded and no longer warrants follow-up _________________________________________________________ There are additional bilateral isoechoic and mixed cystic/solid nodules noted all measuring 1 cm or less in size and would not meet criteria for any biopsy or follow-up. No hypervascularity.  No regional adenopathy. IMPRESSION: Stable 1.6 cm right mid thyroid TR 2 nodule previously TR 3. This nodule has become more cystic and down graded. No further follow-up or biopsy warranted. Stable additional subcentimeter nodules. No new finding. The above is in keeping with the ACR TI-RADS recommendations - J Am Coll Radiol 2017;14:587-595. Electronically Signed   By: Jerilynn Mages.  Shick M.D.   On: 05/03/2019 09:39     Assessment & Plan:  Plan    No orders of the defined types were placed in this encounter.   Problem List Items Addressed  This Visit      Unprioritized   Essential hypertension    Well controlled, no changes to meds. Encouraged heart healthy diet such as the DASH diet and exercise as tolerated.       Hyperlipidemia LDL goal <70    Encouraged heart healthy diet, increase exercise, avoid trans fats, consider a krill oil cap daily      Multiple thyroid nodules (Chronic)    F/u endo       Relevant Orders   Ambulatory referral to Endocrinology   Thyroid Panel With TSH    Other Visit Diagnoses    Hyperlipidemia, unspecified hyperlipidemia type    -  Primary   Relevant Orders   Lipid panel   Comprehensive metabolic panel   Need for 23-polyvalent pneumococcal polysaccharide vaccine       Relevant Orders   Pneumococcal polysaccharide vaccine 23-valent greater than or equal to 2yo subcutaneous/IM (Completed)   Primary hypertension       Relevant Orders   Lipid panel   Comprehensive metabolic panel      Follow-up: Return in about 6 months (around 06/28/2021), or if symptoms worsen or fail to improve, for hypertension, hyperlipidemia.   I,Gordon Zheng,acting as a Education administrator for [employer], DO.,have documented all relevant documentation on the behalf of Ann Held, DO,as directed by  Ann Held, DO while in the presence of [place], DO, have reviewed all documentation for this visit. The documentation on 12/27/20 for the exam, diagnosis, procedures, and orders are all accurate and complete. Ann Held, DO

## 2020-12-28 LAB — THYROID PANEL WITH TSH
Free Thyroxine Index: 2.4 (ref 1.4–3.8)
T3 Uptake: 24 % (ref 22–35)
T4, Total: 9.9 ug/dL (ref 5.1–11.9)
TSH: 2.54 mIU/L (ref 0.40–4.50)

## 2021-04-01 ENCOUNTER — Encounter: Payer: Self-pay | Admitting: Internal Medicine

## 2021-04-01 ENCOUNTER — Other Ambulatory Visit: Payer: Self-pay

## 2021-04-01 ENCOUNTER — Ambulatory Visit (INDEPENDENT_AMBULATORY_CARE_PROVIDER_SITE_OTHER): Payer: Medicare Other | Admitting: Internal Medicine

## 2021-04-01 VITALS — BP 132/80 | HR 52 | Ht 65.0 in | Wt 148.0 lb

## 2021-04-01 DIAGNOSIS — E042 Nontoxic multinodular goiter: Secondary | ICD-10-CM

## 2021-04-01 NOTE — Patient Instructions (Signed)
Please return to see me as needed. °

## 2021-04-01 NOTE — Progress Notes (Signed)
Patient ID: Janet Knox, female   DOB: March 24, 1956, 65 y.o.   MRN: [id number]    HPI  Janet Knox is a 65 y.o.-year-old female, initially referred by her PCP, Dr. Carollee Herter, returning for follow-up for thyroid nodules.  Last visit 2 years ago.  Interim history: She has high blood pressure - added a new BP medication. She tries to exercise more-would like to lose some weight. Her son got married last year and bought a house and she would help him move this fall.  Reviewed and addended history: Patient's right thyroid nodules were found incidentally on carotid ultrasound from 01/25/2018:  Right thyroid lobe: 1.5 cm complex cystic nodule  0.8 cm simple cyst Left thyroid lobe:  0.5 cm solid nodule  The above ultrasound was checked when patient had a severe migraine episode.  This showed decreased carotid flow.  She had a repeat carotid ultrasound in 03/23/2018 that showed normalization of the flow.   Thyroid U/S (04/07/2018): Intermediate partially cystic right thyroid nodule without worrisome features: Parenchymal Echotexture: Mildly heterogenous Isthmus: 0.1 cm Right lobe: 3.9 x 1.4 x 1.2 cm Left lobe: 3.0 x 1.1 x 0.8 cm _______________________________________________________  Nodule # 1: Location: Right; Mid Maximum size: 1.5 cm; Other 2 dimensions: 0.8 x 0.9 cm Composition: mixed cystic and solid (1) Echogenicity: hypoechoic (2) *Given size (>/= 1.5 - 2.4 cm) and appearance, a follow-up ultrasound in 1 year should be considered based on TI-RADS criteria. __________________________________________  Other nodules measure 0.7 cm or less and do not meet criteria for biopsy nor follow-up.  IMPRESSION: Right mid nodule 1 meets criteria for annual follow-up.    Thyroid U/S (05/03/2019):  Stable 1.6 cm TR2 nodule, previously TR3, 1.5 cm.  Nodule has become more cystic.   No further follow-up or intervention is needed  Her thyroid tests were normal: Lab Results  Component  Value Date   TSH 2.54 12/27/2020   TSH 4.35 03/06/2019   TSH 3.38 08/05/2018   TSH 3.26 01/13/2018   TSH 2.80 01/05/2017   TSH 3.13 12/31/2015   TSH 2.77 12/20/2014   TSH 2.40 03/22/2013   TSH 1.48 01/27/2010   TSH 1.50 01/29/2009    Pt denies: - feeling nodules in neck - hoarseness - dysphagia - choking - SOB with lying down  No FH of thyroid ds. No FH of thyroid cancer. No h/o radiation tx to head or neck.  Pt also has a history of HTN, HL, migraines, hysterectomy.  She is exercising consistently 3 times a week - walking on the treadmill and riding an exercise bike.  ROS: Constitutional: no weight gain/no weight loss, no fatigue, no subjective hyperthermia, no subjective hypothermia Eyes: no blurry vision, no xerophthalmia ENT: no sore throat, + see HPI Cardiovascular: no CP/no SOB/no palpitations/no leg swelling Respiratory: no cough/no SOB/no wheezing Gastrointestinal: no N/no V/no D/no C/no acid reflux Musculoskeletal: no muscle aches/no joint aches Skin: no rashes, no hair loss Neurological: no tremors/no numbness/no tingling/no dizziness  I reviewed pt's medications, allergies, PMH, social hx, family hx, and changes were documented in the history of present illness. Otherwise, unchanged from my initial visit note.  Past Medical History:  Diagnosis Date   Anemia    Hyperlipidemia    Hypertension    Skin cancer    Past Surgical History:  Procedure Laterality Date   ABDOMINAL HYSTERECTOMY     EYE SURGERY Left    Torn Retina   KNEE SURGERY     Social History   Socioeconomic  History   Marital status: Married    Spouse name: Not on file   Number of children: 1   Years of education: Not on file   Highest education level: Not on file  Occupational History   Occupation: [employer]  Tobacco Use   Smoking status: Never Smoker   Smokeless tobacco: Never Used  Substance and Sexual Activity   Alcohol use: Yes    Alcohol/week: 0.6 oz    Types: 1  Glasses of wine per week   Drug use: No   Sexual activity: Yes    Partners: Male  Lifestyle   Physical activity:    Days per week: Not on file    Minutes per session: Not on file  Relationships  Social History Narrative   Exercise-- 3 x a week for 1 hour   Current Outpatient Medications on File Prior to Visit  Medication Sig Dispense Refill   aspirin 81 MG tablet Take 81 mg by mouth daily.     atorvastatin (LIPITOR) 40 MG tablet Take 1 tablet (40 mg total) by mouth daily. 90 tablet 3   Cholecalciferol (VITAMIN D3) 5000 UNITS TABS Take 1 tablet by mouth daily.      Coenzyme Q10 (COQ-10) 200 MG CAPS Take 1 capsule by mouth daily.     COVID-19 mRNA vaccine, Moderna, 100 MCG/0.5ML injection INJECT AS DIRECTED .25 mL 0   estradiol (ESTRACE) 2 MG tablet Take 1 tablet (2 mg total) by mouth daily. 90 tablet 1   fenofibrate 54 MG tablet TAKE 1 TABLET DAILY 90 tablet 1   Ferrous Sulfate 27 MG TABS Take 1 tablet by mouth daily.     losartan (COZAAR) 100 MG tablet TAKE 1 TABLET DAILY 90 tablet 1   metoprolol tartrate (LOPRESSOR) 50 MG tablet TAKE 1 TABLET TWICE A DAY 180 tablet 1   Multiple Vitamin (MULTIVITAMIN) tablet Take 1 tablet by mouth daily.     niacin 500 MG tablet Take 2 tablets (1,000 mg total) by mouth daily with breakfast. 180 tablet 3   Omega-3 Fatty Acids (FISH OIL) 1200 MG CAPS Take 1 capsule by mouth daily.      SUMAtriptan (IMITREX) 50 MG tablet Take 1 tablet (50 mg total) by mouth every 2 (two) hours as needed for migraine. May repeat in 2 hours if headache persists or recurs. 10 tablet 1   vitamin E 400 UNIT capsule Take 400 Units by mouth daily.     No current facility-administered medications on file prior to visit.   Allergies  Allergen Reactions   Latex Rash   Family History  Problem Relation Age of Onset   Alzheimer's disease Mother    Hyperlipidemia Mother    Hypertension Mother    Coronary artery disease Paternal Grandfather    Stroke Paternal Grandfather     Hyperlipidemia Father    Hypertension Father    Atrial fibrillation Father    Mother with basal cell carcinoma and brother with melanoma.  PE: BP 132/80 (BP Location: Left Arm, Patient Position: Sitting)   Pulse (!) 52   Ht 5\' 5"  (1.651 m)   Wt 148 lb (67.1 kg)   SpO2 98%   BMI 24.63 kg/m  Wt Readings from Last 3 Encounters:  04/01/21 148 lb (67.1 kg)  12/27/20 145 lb 6.4 oz (66 kg)  07/16/20 140 lb 9.6 oz (63.8 kg)   Constitutional: normal weight, in NAD Eyes: PERRLA, EOMI, no exophthalmos ENT: moist mucous membranes, no thyromegaly, no cervical lymphadenopathy Cardiovascular: RRR, No  MRG Respiratory: CTA B Gastrointestinal: abdomen soft, NT, ND, BS+ Musculoskeletal: no deformities, strength intact in all 4 Skin: moist, warm, no rashes Neurological: no tremor with outstretched hands, DTR normal in all 4  ASSESSMENT: 1. Multiple thyroid nodules  PLAN: 1. Multiple thyroid nodules -We reviewed together her latest thyroid ultrasound obtained 05/03/2019.  She had a stable 1.6 cm right dominant nodule, which was stable in size from before but has become more cystic.  This was considered a benign feature and no follow-up or intervention was recommended. -At this visit, she does not complain of any neck compression symptoms: No problem swallowing, pain with swallowing, neck pressure -At this visit, we also reviewed her previous thyroid tests: The TSH was normal in 12/2020. -At this visit, she has no neck compression symptoms -Of note, she does not have a family history of thyroid cancer or personal history of radiation therapy to head or neck to increase her risk for thyroid cancer -we discussed about the fact that her nodule is considered very low risk and, in the absence of symptoms, no repeat thyroid ultrasound is indicated. -I advised her to let me know if she develops neck compression symptoms in the future, and in that case we should definitely repeat the ultrasound. -She agrees  with this plan -I will see her back as needed  Philemon Kingdom, MD PhD [facility] Endocrinology

## 2021-05-16 ENCOUNTER — Other Ambulatory Visit: Payer: Self-pay | Admitting: Family Medicine

## 2021-05-16 DIAGNOSIS — I10 Essential (primary) hypertension: Secondary | ICD-10-CM

## 2021-05-16 DIAGNOSIS — Z0000 Encounter for general adult medical examination without abnormal findings: Secondary | ICD-10-CM

## 2021-06-11 ENCOUNTER — Other Ambulatory Visit: Payer: Self-pay

## 2021-06-11 ENCOUNTER — Ambulatory Visit (INDEPENDENT_AMBULATORY_CARE_PROVIDER_SITE_OTHER): Payer: Medicare Other | Admitting: Family

## 2021-06-11 VITALS — BP 157/78 | HR 57 | Temp 98.1°F | Resp 16 | Ht 65.0 in | Wt 148.0 lb

## 2021-06-11 DIAGNOSIS — H6593 Unspecified nonsuppurative otitis media, bilateral: Secondary | ICD-10-CM | POA: Diagnosis not present

## 2021-06-11 NOTE — Progress Notes (Signed)
Subjective:   By signing my name below, I, Shehryar Baig, attest that this documentation has been prepared under the direction and in the presence of Debbrah Alar NP. 06/11/2021    Patient ID: Janet Knox, female    DOB: [birth year], 65 y.o.   MRN: [id number]  Chief Complaint  Patient presents with   Otalgia    Patient complains of on and off bilateral ear ache.    Dizziness    Complains of dizziness     Otalgia   Dizziness  Patient is in today for an office visit.  She complains of on and off ear aches in both ears for the past couple of weeks. The left ear is worse than the right ear. She also has dizziness as well. She reports having a painful earache episode last Saturday that improved the next day on its own.    Health Maintenance Due  Topic Date Due   COVID-19 Vaccine (4 - Booster for Moderna series) 12/24/2020   INFLUENZA VACCINE  04/28/2021   PAP SMEAR-Modifier  06/22/2021    Past Medical History:  Diagnosis Date   Anemia    Hyperlipidemia    Hypertension    Skin cancer     Past Surgical History:  Procedure Laterality Date   ABDOMINAL HYSTERECTOMY     EYE SURGERY Left    Torn Retina   KNEE SURGERY      Family History  Problem Relation Age of Onset   Alzheimer's disease Mother    Hyperlipidemia Mother    Hypertension Mother    Coronary artery disease Paternal Grandfather    Stroke Paternal Grandfather    Hyperlipidemia Father    Hypertension Father    Atrial fibrillation Father     Social History   Socioeconomic History   Marital status: Married    Spouse name: Not on file   Number of children: Not on file   Years of education: Not on file   Highest education level: Not on file  Occupational History   Occupation: [employer]  Tobacco Use   Smoking status: Never   Smokeless tobacco: Never  Vaping Use   Vaping Use: Never used  Substance and Sexual Activity   Alcohol use: Yes    Alcohol/week: 1.0 standard drink    Types: 1  Glasses of wine per week   Drug use: No   Sexual activity: Yes    Partners: Male  Other Topics Concern   Not on file  Social History Narrative   Exercise-- 3 x a week for 1 hour   Social Determinants of Health   Financial Resource Strain: Not on file  Food Insecurity: Not on file  Transportation Needs: Not on file  Physical Activity: Not on file  Stress: Not on file  Social Connections: Not on file  Intimate Partner Violence: Not on file    Outpatient Medications Prior to Visit  Medication Sig Dispense Refill   aspirin 81 MG tablet Take 81 mg by mouth daily.     atorvastatin (LIPITOR) 40 MG tablet Take 1 tablet (40 mg total) by mouth daily. 90 tablet 3   Cholecalciferol (VITAMIN D3) 5000 UNITS TABS Take 1 tablet by mouth daily.      Coenzyme Q10 (COQ-10) 200 MG CAPS Take 1 capsule by mouth daily.     COVID-19 mRNA vaccine, Moderna, 100 MCG/0.5ML injection INJECT AS DIRECTED .25 mL 0   estradiol (ESTRACE) 2 MG tablet Take 1 tablet (2 mg total) by mouth daily.  90 tablet 1   fenofibrate 54 MG tablet TAKE 1 TABLET DAILY 90 tablet 1   Ferrous Sulfate 27 MG TABS Take 1 tablet by mouth daily.     losartan (COZAAR) 100 MG tablet TAKE 1 TABLET DAILY 90 tablet 1   metoprolol tartrate (LOPRESSOR) 50 MG tablet TAKE 1 TABLET TWICE A DAY 180 tablet 1   Multiple Vitamin (MULTIVITAMIN) tablet Take 1 tablet by mouth daily.     niacin 500 MG tablet Take 2 tablets (1,000 mg total) by mouth daily with breakfast. 180 tablet 3   Omega-3 Fatty Acids (FISH OIL) 1200 MG CAPS Take 1 capsule by mouth daily.      SUMAtriptan (IMITREX) 50 MG tablet Take 1 tablet (50 mg total) by mouth every 2 (two) hours as needed for migraine. May repeat in 2 hours if headache persists or recurs. 10 tablet 1   vitamin E 400 UNIT capsule Take 400 Units by mouth daily.     No facility-administered medications prior to visit.    Allergies  Allergen Reactions   Latex Rash    Review of Systems  HENT:  Positive for ear  pain (L>R).   Neurological:  Positive for dizziness.      Objective:    Physical Exam Constitutional:      General: She is not in acute distress.    Appearance: Normal appearance. She is not ill-appearing.  HENT:     Head: Normocephalic and atraumatic.     Right Ear: External ear normal. Tympanic membrane is retracted. Tympanic membrane is not injected, perforated, erythematous or bulging.     Left Ear: External ear normal. Tympanic membrane is retracted. Tympanic membrane is not injected, perforated, erythematous or bulging.     Ears:     Comments: Clear fluid noted behind bilateral TM Eyes:     Extraocular Movements: Extraocular movements intact.     Pupils: Pupils are equal, round, and reactive to light.  Cardiovascular:     Rate and Rhythm: Normal rate and regular rhythm.     Heart sounds: Normal heart sounds. No murmur heard.   No gallop.  Pulmonary:     Effort: Pulmonary effort is normal. No respiratory distress.     Breath sounds: Normal breath sounds. No wheezing or rales.  Skin:    General: Skin is warm and dry.  Neurological:     Mental Status: She is alert and oriented to person, place, and time.  Psychiatric:        Behavior: Behavior normal.    BP (!) 157/78 (BP Location: Right Arm, Patient Position: Sitting, Cuff Size: Small)   Pulse (!) 57   Temp 98.1 F (36.7 C) (Oral)   Resp 16   Ht '5\' 5"'$  (1.651 m)   Wt 148 lb (67.1 kg)   SpO2 100%   BMI 24.63 kg/m  Wt Readings from Last 3 Encounters:  06/11/21 148 lb (67.1 kg)  04/01/21 148 lb (67.1 kg)  12/27/20 145 lb 6.4 oz (66 kg)       Assessment & Plan:   Problem List Items Addressed This Visit       Unprioritized   Bilateral serous otitis media - Primary    New. No sign of infection. Recommended that patient add claritin '10mg'$  once daily as well as flonase 2 sprays each nostril once daily. She is advised to call if new/worsening symptoms or if symptoms fail to improve.         No orders of the  defined types were placed in this encounter.   I, Debbrah Alar NP, personally preformed the services described in this documentation.  All medical record entries made by the scribe were at my direction and in my presence.  I have reviewed the chart and discharge instructions (if applicable) and agree that the record reflects my personal performance and is accurate and complete. 06/11/2021   I,Shehryar Baig,acting as a scribe for Nance Pear, NP.,have documented all relevant documentation on the behalf of Nance Pear, NP,as directed by  Nance Pear, NP while in the presence of Nance Pear, NP.   Nance Pear, NP

## 2021-06-11 NOTE — Patient Instructions (Signed)
Please begin claritin 10 mg once daily.  Add flonase 2 sprays each nostril once daily. Call if symptoms worsen or don't improve.

## 2021-06-11 NOTE — Assessment & Plan Note (Signed)
New. No sign of infection. Recommended that patient add claritin '10mg'$  once daily as well as flonase 2 sprays each nostril once daily. She is advised to call if new/worsening symptoms or if symptoms fail to improve.

## 2021-06-23 ENCOUNTER — Encounter: Payer: Self-pay | Admitting: Family Medicine

## 2021-06-24 MED ORDER — AMOXICILLIN-POT CLAVULANATE 875-125 MG PO TABS: 1.0000 | ORAL_TABLET | Freq: Two times a day (BID) | ORAL | 0 refills | Status: DC

## 2021-06-30 ENCOUNTER — Ambulatory Visit (INDEPENDENT_AMBULATORY_CARE_PROVIDER_SITE_OTHER): Payer: Medicare Other | Admitting: Family Medicine

## 2021-06-30 ENCOUNTER — Encounter: Payer: Self-pay | Admitting: Family Medicine

## 2021-06-30 ENCOUNTER — Other Ambulatory Visit: Payer: Self-pay

## 2021-06-30 VITALS — BP 110/80 | HR 69 | Temp 98.3°F | Resp 18 | Ht 65.0 in | Wt 149.8 lb

## 2021-06-30 DIAGNOSIS — I6523 Occlusion and stenosis of bilateral carotid arteries: Secondary | ICD-10-CM

## 2021-06-30 DIAGNOSIS — E042 Nontoxic multinodular goiter: Secondary | ICD-10-CM

## 2021-06-30 DIAGNOSIS — E1169 Type 2 diabetes mellitus with other specified complication: Secondary | ICD-10-CM

## 2021-06-30 DIAGNOSIS — I10 Essential (primary) hypertension: Secondary | ICD-10-CM | POA: Diagnosis not present

## 2021-06-30 DIAGNOSIS — Z23 Encounter for immunization: Secondary | ICD-10-CM | POA: Diagnosis not present

## 2021-06-30 DIAGNOSIS — E785 Hyperlipidemia, unspecified: Secondary | ICD-10-CM

## 2021-06-30 DIAGNOSIS — E041 Nontoxic single thyroid nodule: Secondary | ICD-10-CM

## 2021-06-30 LAB — LIPID PANEL
Cholesterol: 166 mg/dL (ref 0–200)
HDL: 65.1 mg/dL (ref >39.00)
LDL Cholesterol: 82 mg/dL (ref 0–99)
NonHDL: 101.14
Total CHOL/HDL Ratio: 3
Triglycerides: 97 mg/dL (ref 0.0–149.0)
VLDL: 19.4 mg/dL (ref 0.0–40.0)

## 2021-06-30 LAB — COMPREHENSIVE METABOLIC PANEL
ALT: 16 U/L (ref 0–35)
AST: 23 U/L (ref 0–37)
Albumin: 4.2 g/dL (ref 3.5–5.2)
Alkaline Phosphatase: 51 U/L (ref 39–117)
BUN: 18 mg/dL (ref 6–23)
CO2: 27 mEq/L (ref 19–32)
Calcium: 9.2 mg/dL (ref 8.4–10.5)
Chloride: 104 mEq/L (ref 96–112)
Creatinine, Ser: 0.62 mg/dL (ref 0.40–1.20)
GFR: 93.11 mL/min (ref >60.00)
Glucose, Bld: 84 mg/dL (ref 70–99)
Potassium: 4.3 mEq/L (ref 3.5–5.1)
Sodium: 140 mEq/L (ref 135–145)
Total Bilirubin: 0.5 mg/dL (ref 0.2–1.2)
Total Protein: 6.8 g/dL (ref 6.0–8.3)

## 2021-06-30 NOTE — Assessment & Plan Note (Signed)
Encourage heart healthy diet such as MIND or DASH diet, increase exercise, avoid trans fats, simple carbohydrates and processed foods, consider a krill or fish or flaxseed oil cap daily.  °

## 2021-06-30 NOTE — Assessment & Plan Note (Signed)
Well controlled, no changes to meds. Encouraged heart healthy diet such as the DASH diet and exercise as tolerated.  °

## 2021-06-30 NOTE — Assessment & Plan Note (Signed)
Check thyroid panel 

## 2021-06-30 NOTE — Assessment & Plan Note (Signed)
Pt is overdue for carotid US

## 2021-06-30 NOTE — Progress Notes (Signed)
Established Patient Office Visit  Subjective:  Patient ID: [name], female    DOB: 09-Jan-1956  Age: 65 y.o. MRN: [id number]  CC:  Chief Complaint  Patient presents with   Hypertension   Hyperlipidemia   Follow-up    HPI Janet Knox presents for f/u cholesterol and she would like her flu shot    no complaints   Past Medical History:  Diagnosis Date   Anemia    Hyperlipidemia    Hypertension    Skin cancer     Past Surgical History:  Procedure Laterality Date   ABDOMINAL HYSTERECTOMY     EYE SURGERY Left    Torn Retina   KNEE SURGERY      Family History  Problem Relation Age of Onset   Alzheimer's disease Mother    Hyperlipidemia Mother    Hypertension Mother    Coronary artery disease Paternal Grandfather    Stroke Paternal Grandfather    Hyperlipidemia Father    Hypertension Father    Atrial fibrillation Father     Social History   Socioeconomic History   Marital status: Married    Spouse name: Not on file   Number of children: Not on file   Years of education: Not on file   Highest education level: Not on file  Occupational History   Occupation: [employer]  Tobacco Use   Smoking status: Never   Smokeless tobacco: Never  Vaping Use   Vaping Use: Never used  Substance and Sexual Activity   Alcohol use: Yes    Alcohol/week: 1.0 standard drink    Types: 1 Glasses of wine per week   Drug use: No   Sexual activity: Yes    Partners: Male  Other Topics Concern   Not on file  Social History Narrative   Exercise-- 3 x a week for 1 hour   Social Determinants of Health   Financial Resource Strain: Not on file  Food Insecurity: Not on file  Transportation Needs: Not on file  Physical Activity: Not on file  Stress: Not on file  Social Connections: Not on file  Intimate Partner Violence: Not on file    Outpatient Medications Prior to Visit  Medication Sig Dispense Refill   amoxicillin-clavulanate (AUGMENTIN) 875-125 MG tablet  Take 1 tablet by mouth 2 (two) times daily. 20 tablet 0   aspirin 81 MG tablet Take 81 mg by mouth daily.     atorvastatin (LIPITOR) 40 MG tablet Take 1 tablet (40 mg total) by mouth daily. 90 tablet 3   Cholecalciferol (VITAMIN D3) 5000 UNITS TABS Take 1 tablet by mouth daily.      Coenzyme Q10 (COQ-10) 200 MG CAPS Take 1 capsule by mouth daily.     COVID-19 mRNA vaccine, Moderna, 100 MCG/0.5ML injection INJECT AS DIRECTED .25 mL 0   estradiol (ESTRACE) 2 MG tablet Take 1 tablet (2 mg total) by mouth daily. 90 tablet 1   fenofibrate 54 MG tablet TAKE 1 TABLET DAILY 90 tablet 1   Ferrous Sulfate 27 MG TABS Take 1 tablet by mouth daily.     losartan (COZAAR) 100 MG tablet TAKE 1 TABLET DAILY 90 tablet 1   metoprolol tartrate (LOPRESSOR) 50 MG tablet TAKE 1 TABLET TWICE A DAY 180 tablet 1   Multiple Vitamin (MULTIVITAMIN) tablet Take 1 tablet by mouth daily.     niacin 500 MG tablet Take 2 tablets (1,000 mg total) by mouth daily with breakfast. 180 tablet 3   Omega-3  Fatty Acids (FISH OIL) 1200 MG CAPS Take 1 capsule by mouth daily.      SUMAtriptan (IMITREX) 50 MG tablet Take 1 tablet (50 mg total) by mouth every 2 (two) hours as needed for migraine. May repeat in 2 hours if headache persists or recurs. 10 tablet 1   vitamin E 400 UNIT capsule Take 400 Units by mouth daily.     No facility-administered medications prior to visit.    Allergies  Allergen Reactions   Latex Rash    ROS Review of Systems  Constitutional:  Negative for appetite change, diaphoresis, fatigue and unexpected weight change.  Eyes:  Negative for pain, redness and visual disturbance.  Respiratory:  Negative for cough, chest tightness, shortness of breath and wheezing.   Cardiovascular:  Negative for chest pain, palpitations and leg swelling.  Endocrine: Negative for cold intolerance, heat intolerance, polydipsia, polyphagia and polyuria.  Genitourinary:  Negative for difficulty urinating, dysuria and frequency.   Neurological:  Negative for dizziness, light-headedness, numbness and headaches.     Objective:    Physical Exam Vitals and nursing note reviewed.  Constitutional:      Appearance: She is well-developed.  HENT:     Head: Normocephalic and atraumatic.  Eyes:     Conjunctiva/sclera: Conjunctivae normal.  Neck:     Thyroid: No thyromegaly.     Vascular: No carotid bruit or JVD.  Cardiovascular:     Rate and Rhythm: Normal rate and regular rhythm.     Heart sounds: Normal heart sounds. No murmur heard. Pulmonary:     Effort: Pulmonary effort is normal. No respiratory distress.     Breath sounds: Normal breath sounds. No wheezing or rales.  Chest:     Chest wall: No tenderness.  Musculoskeletal:     Cervical back: Normal range of motion and neck supple.  Neurological:     Mental Status: She is alert and oriented to person, place, and time.  Psychiatric:        Mood and Affect: Mood normal.        Thought Content: Thought content normal.        Judgment: Judgment normal.    BP 110/80 (BP Location: Left Arm, Patient Position: Sitting, Cuff Size: Normal)   Pulse 69   Temp 98.3 F (36.8 C) (Oral)   Resp 18   Ht 5\' 5"  (1.651 m)   Wt 149 lb 12.8 oz (67.9 kg)   SpO2 100%   BMI 24.93 kg/m  Wt Readings from Last 3 Encounters:  06/30/21 149 lb 12.8 oz (67.9 kg)  06/11/21 148 lb (67.1 kg)  04/01/21 148 lb (67.1 kg)     Health Maintenance Due  Topic Date Due   COVID-19 Vaccine (4 - Booster for Moderna series) 11/18/2020   PAP SMEAR-Modifier  06/22/2021    There are no preventive care reminders to display for this patient.  Lab Results  Component Value Date   TSH 2.54 12/27/2020   Lab Results  Component Value Date   WBC 4.2 03/06/2019   HGB 11.9 (L) 03/06/2019   HCT 36.3 03/06/2019   MCV 95.5 03/06/2019   PLT 236.0 03/06/2019   Lab Results  Component Value Date   NA 139 12/27/2020   K 4.7 12/27/2020   CO2 27 12/27/2020   GLUCOSE 94 12/27/2020   BUN 21  12/27/2020   CREATININE 0.61 12/27/2020   BILITOT 0.6 12/27/2020   ALKPHOS 49 12/27/2020   AST 21 12/27/2020   ALT 14 12/27/2020  PROT 6.5 12/27/2020   ALBUMIN 4.1 12/27/2020   CALCIUM 9.0 12/27/2020   ANIONGAP 10 08/26/2016   GFR 93.81 12/27/2020   Lab Results  Component Value Date   CHOL 162 12/27/2020   Lab Results  Component Value Date   HDL 60.80 12/27/2020   Lab Results  Component Value Date   LDLCALC 85 12/27/2020   Lab Results  Component Value Date   TRIG 83.0 12/27/2020   Lab Results  Component Value Date   CHOLHDL 3 12/27/2020   No results found for: HGBA1C    Assessment & Plan:   Problem List Items Addressed This Visit       Unprioritized   Multiple thyroid nodules (Chronic)    Check thyroid panel       Bilateral carotid artery stenosis    Pt is overdue for carotid US       Relevant Orders   US Carotid Duplex Bilateral   Essential hypertension    Well controlled, no changes to meds. Encouraged heart healthy diet such as the DASH diet and exercise as tolerated.       Hyperlipidemia LDL goal <70    Encourage heart healthy diet such as MIND or DASH diet, increase exercise, avoid trans fats, simple carbohydrates and processed foods, consider a krill or fish or flaxseed oil cap daily.       Other Visit Diagnoses     Need for influenza vaccination    -  Primary   Relevant Orders   Flu Vaccine QUAD High Dose(Fluad) (Completed)   Hyperlipidemia, unspecified hyperlipidemia type       Relevant Orders   Comprehensive metabolic panel   Lipid panel   Thyroid nodule       Relevant Orders   Thyroid Panel With TSH   Hyperlipidemia associated with type 2 diabetes mellitus (HCC)   (Chronic)         No orders of the defined types were placed in this encounter.   Follow-up: No follow-ups on file.    Ann Held, DO

## 2021-06-30 NOTE — Patient Instructions (Addendum)

## 2021-07-01 LAB — THYROID PANEL WITH TSH
Free Thyroxine Index: 2.2 (ref 1.4–3.8)
T3 Uptake: 24 % (ref 22–35)
T4, Total: 9.3 ug/dL (ref 5.1–11.9)
TSH: 4.23 mIU/L (ref 0.40–4.50)

## 2021-07-02 ENCOUNTER — Other Ambulatory Visit: Payer: Self-pay

## 2021-07-02 ENCOUNTER — Ambulatory Visit (HOSPITAL_BASED_OUTPATIENT_CLINIC_OR_DEPARTMENT_OTHER)
Admission: RE | Admit: 2021-07-02 | Discharge: 2021-07-02 | Disposition: A | Discharge status: Home / Self Care | Payer: Medicare Other | Source: Ambulatory Visit | Attending: Family Medicine | Admitting: Family Medicine

## 2021-07-02 DIAGNOSIS — I6523 Occlusion and stenosis of bilateral carotid arteries: Secondary | ICD-10-CM | POA: Insufficient documentation

## 2021-07-03 ENCOUNTER — Other Ambulatory Visit: Payer: Self-pay | Admitting: Family Medicine

## 2021-07-03 ENCOUNTER — Telehealth: Payer: Self-pay | Admitting: Family Medicine

## 2021-07-03 NOTE — Telephone Encounter (Signed)
[name] calling again for Janet Knox  Whenever Janet Knox has time please give the office a call for Dr Earleen Newport

## 2021-07-03 NOTE — Telephone Encounter (Signed)
Sherral from Radiologist needs for [facility] to contact Dr Earleen Newport back regarding bilateral carotid duplex. [phone]. Please advise

## 2021-07-04 ENCOUNTER — Other Ambulatory Visit: Payer: Self-pay | Admitting: Family Medicine

## 2021-07-04 DIAGNOSIS — R9389 Abnormal findings on diagnostic imaging of other specified body structures: Secondary | ICD-10-CM

## 2021-07-19 ENCOUNTER — Other Ambulatory Visit: Payer: Self-pay | Admitting: Family Medicine

## 2021-07-19 DIAGNOSIS — Z780 Asymptomatic menopausal state: Secondary | ICD-10-CM

## 2021-07-19 DIAGNOSIS — Z0000 Encounter for general adult medical examination without abnormal findings: Secondary | ICD-10-CM

## 2021-07-25 ENCOUNTER — Other Ambulatory Visit: Payer: Self-pay

## 2021-07-25 ENCOUNTER — Ambulatory Visit (HOSPITAL_BASED_OUTPATIENT_CLINIC_OR_DEPARTMENT_OTHER)
Admission: RE | Admit: 2021-07-25 | Discharge: 2021-07-25 | Disposition: A | Discharge status: Home / Self Care | Payer: Medicare Other | Source: Ambulatory Visit | Attending: Family Medicine | Admitting: Family Medicine

## 2021-07-25 ENCOUNTER — Ambulatory Visit: Payer: Medicare Other | Attending: Internal Medicine

## 2021-07-25 ENCOUNTER — Encounter (HOSPITAL_BASED_OUTPATIENT_CLINIC_OR_DEPARTMENT_OTHER): Payer: Self-pay

## 2021-07-25 DIAGNOSIS — R9389 Abnormal findings on diagnostic imaging of other specified body structures: Secondary | ICD-10-CM | POA: Insufficient documentation

## 2021-07-25 DIAGNOSIS — Z23 Encounter for immunization: Secondary | ICD-10-CM

## 2021-07-25 MED ORDER — IOHEXOL 350 MG/ML SOLN
100.0000 mL | Freq: Once | INTRAVENOUS | Status: AC | PRN
  Administered 2021-07-25: 100 mL via INTRAVENOUS

## 2021-07-25 NOTE — Progress Notes (Signed)
   Covid-19 Vaccination Clinic  Name:  DERIYAH KUNATH    MRN: [id number] DOB: Jun 24, 1956  07/25/2021  Ms. Rana was observed post Covid-19 immunization for 15 minutes without incident. She was provided with Vaccine Information Sheet and instruction to access the V-Safe system.   Ms. Disbro was instructed to call 911 with any severe reactions post vaccine: Difficulty breathing  Swelling of face and throat  A fast heartbeat  A bad rash all over body  Dizziness and weakness   Immunizations Administered     Name Date Dose VIS Date Route   Moderna Covid-19 vaccine Bivalent Booster 07/25/2021  8:58 AM 0.5 mL 05/10/2021 Intramuscular   Manufacturer: Moderna   Lot: [id number]   [place]: [id number]

## 2021-07-28 ENCOUNTER — Encounter: Payer: Self-pay | Admitting: Family Medicine

## 2021-07-28 ENCOUNTER — Other Ambulatory Visit: Payer: Self-pay | Admitting: Family Medicine

## 2021-07-28 DIAGNOSIS — I671 Cerebral aneurysm, nonruptured: Secondary | ICD-10-CM

## 2021-07-30 ENCOUNTER — Encounter: Payer: Self-pay | Admitting: Family Medicine

## 2021-08-01 ENCOUNTER — Other Ambulatory Visit (HOSPITAL_BASED_OUTPATIENT_CLINIC_OR_DEPARTMENT_OTHER): Payer: Medicare Other

## 2021-08-01 ENCOUNTER — Other Ambulatory Visit: Payer: Self-pay | Admitting: Family Medicine

## 2021-08-01 MED ORDER — CYCLOBENZAPRINE HCL 10 MG PO TABS: 10.0000 mg | ORAL_TABLET | Freq: Three times a day (TID) | ORAL | 0 refills | Status: DC | PRN

## 2021-08-01 NOTE — Telephone Encounter (Signed)
OV/VV?

## 2021-08-04 NOTE — Progress Notes (Signed)
[facility] [place]  ASSESSMENT / PLAN: 65 y.o. 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 She should continue ASA 81mg  PO QD for this indefinitely. Follow up with me for repeat carotid duplex. Will check renal duplex to exclude FMD / aneurysm of renal vessels. 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 Will refer her to Dr. Kathyrn Sheriff.  CHIEF COMPLAINT: FMD of carotid arteries  HISTORY OF PRESENT ILLNESS: Janet Knox is a 65 y.o. female referred to clinic for evaluation of fibromuscular dysplasia of bilateral carotid arteries.  The patient was evaluated by her primary care physician and a carotid artery duplex performed.  This revealed evidence of fibromuscular dysplasia.  A CT angiogram was then performed.  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  The patient is asymptomatic from a neurologic standpoint.  She specifically denies unilateral weakness, numbness, difficulty speaking, difficulty swallowing, facial droop.  Spent the majority of the visit reviewing the natural history of fibromuscular dysplasia and the rationale for surveillance and treatment.  VASCULAR SURGICAL HISTORY: none  VASCULAR RISK FACTORS: Negative history of stroke / transient ischemic attack. Negative history of coronary artery disease.  Negative history of diabetes mellitus.  Negative history of smoking.  Positive history of hypertension.  Negative history of chronic kidney disease.  Negative history of chronic obstructive pulmonary disease.  FUNCTIONAL STATUS: ECOG performance status: (0) Fully active, able to carry on all predisease performance without restriction Ambulatory status: Ambulatory within the community without limits  Past Medical History:  Diagnosis Date   Anemia     Hyperlipidemia    Hypertension    Skin cancer     Past Surgical History:  Procedure Laterality Date   ABDOMINAL HYSTERECTOMY     EYE SURGERY Left    Torn Retina   KNEE SURGERY      Family History  Problem Relation Age of Onset   Alzheimer's disease Mother    Hyperlipidemia Mother    Hypertension Mother    Coronary artery disease Paternal Grandfather    Stroke Paternal Grandfather    Hyperlipidemia Father    Hypertension Father    Atrial fibrillation Father     Social History   Socioeconomic History   Marital status: Married    Spouse name: Not on file   Number of children: Not on file   Years of education: Not on file   Highest education level: Not on file  Occupational History   Occupation: [employer]  Tobacco Use   Smoking status: Never   Smokeless tobacco: Never  Vaping Use   Vaping Use: Never used  Substance and Sexual Activity   Alcohol use: Yes    Alcohol/week: 1.0 standard drink    Types: 1 Glasses of wine per week   Drug use: No   Sexual activity: Yes    Partners: Male  Other Topics Concern   Not on file  Social History Narrative   Exercise-- 3 x a week for 1 hour   Social Determinants of Health   Financial Resource Strain: Not on file  Food Insecurity: Not on file  Transportation Needs: Not on file  Physical Activity: Not on file  Stress: Not on file  Social Connections: Not on file  Intimate Partner Violence: Not on file    Allergies  Allergen Reactions   Latex Rash  Current Outpatient Medications  Medication Sig Dispense Refill   aspirin 81 MG tablet Take 81 mg by mouth daily.     atorvastatin (LIPITOR) 40 MG tablet Take 1 tablet (40 mg total) by mouth daily. 90 tablet 3   Cholecalciferol (VITAMIN D3) 5000 UNITS TABS Take 1 tablet by mouth daily.      Coenzyme Q10 (COQ-10) 200 MG CAPS Take 1 capsule by mouth daily.     COVID-19 mRNA vaccine, Moderna, 100 MCG/0.5ML injection INJECT AS DIRECTED .25 mL 0   cyclobenzaprine  (FLEXERIL) 10 MG tablet Take 1 tablet (10 mg total) by mouth 3 (three) times daily as needed for muscle spasms. 30 tablet 0   estradiol (ESTRACE) 2 MG tablet TAKE 1 TABLET DAILY 90 tablet 1   fenofibrate 54 MG tablet TAKE 1 TABLET DAILY 90 tablet 1   Ferrous Sulfate 27 MG TABS Take 1 tablet by mouth daily.     losartan (COZAAR) 100 MG tablet TAKE 1 TABLET DAILY 90 tablet 1   metoprolol tartrate (LOPRESSOR) 50 MG tablet TAKE 1 TABLET TWICE A DAY 180 tablet 1   Multiple Vitamin (MULTIVITAMIN) tablet Take 1 tablet by mouth daily.     niacin 500 MG tablet Take 2 tablets (1,000 mg total) by mouth daily with breakfast. 180 tablet 3   Omega-3 Fatty Acids (FISH OIL) 1200 MG CAPS Take 1 capsule by mouth daily.      SUMAtriptan (IMITREX) 50 MG tablet Take 1 tablet (50 mg total) by mouth every 2 (two) hours as needed for migraine. May repeat in 2 hours if headache persists or recurs. 10 tablet 1   vitamin E 400 UNIT capsule Take 400 Units by mouth daily.     No current facility-administered medications for this visit.    REVIEW OF SYSTEMS:  [X]  denotes positive finding, [ ]  denotes negative finding Cardiac  Comments:  Chest pain or chest pressure:    Shortness of breath upon exertion:    Short of breath when lying flat:    Irregular heart rhythm:        Vascular    Pain in calf, thigh, or hip brought on by ambulation:    Pain in feet at night that wakes you up from your sleep:     Blood clot in your veins:    Leg swelling:         Pulmonary    Oxygen at home:    Productive cough:     Wheezing:         Neurologic    Sudden weakness in arms or legs:     Sudden numbness in arms or legs:     Sudden onset of difficulty speaking or slurred speech:    Temporary loss of vision in one eye:     Problems with dizziness:         Gastrointestinal    Blood in stool:     Vomited blood:         Genitourinary    Burning when urinating:     Blood in urine:        Psychiatric    Major depression:          Hematologic    Bleeding problems:    Problems with blood clotting too easily:        Skin    Rashes or ulcers:        Constitutional    Fever or chills:      PHYSICAL EXAM Vitals:  08/05/21 0822 08/05/21 0824  BP: (!) 141/80 (!) 145/84  Pulse: 65   Resp: 20   Temp: 99 F (37.2 C)   SpO2: 99%   Weight: 152 lb (68.9 kg)   Height: 5\' 5"  (1.651 m)     Constitutional: well appearing. no distress. Appears well nourished.  Neurologic: CN intact. no focal findings. Normal gait and station. Psychiatric:  Mood and affect symmetric and appropriate. Eyes:  No icterus. No conjunctival pallor. Ears, nose, throat:  mucous membranes moist. Midline trachea.  Cardiac: regular rate and rhythm.  Respiratory:  unlabored. Abdominal: not distended..  Extremity: no edema. no cyanosis. no pallor.   PERTINENT LABORATORY AND RADIOLOGIC DATA  Most recent CBC CBC Latest Ref Rng & Units 03/06/2019 01/13/2018 07/01/2017  WBC 4.0 - 10.5 K/uL 4.2 6.3 4.6  Hemoglobin 12.0 - 15.0 g/dL 11.9(L) 12.2 12.1  Hematocrit 36.0 - 46.0 % 36.3 36.6 36.3  Platelets 150.0 - 400.0 K/uL 236.0 271.0 268.0     Most recent CMP CMP Latest Ref Rng & Units 06/30/2021 12/27/2020 07/16/2020  Glucose 70 - 99 mg/dL 84 94 86  BUN 6 - 23 mg/dL 18 21 14   Creatinine 0.40 - 1.20 mg/dL 0.62 0.61 0.63  Sodium 135 - 145 mEq/L 140 139 139  Potassium 3.5 - 5.1 mEq/L 4.3 4.7 4.7  Chloride 96 - 112 mEq/L 104 105 105  CO2 19 - 32 mEq/L 27 27 25   Calcium 8.4 - 10.5 mg/dL 9.2 9.0 9.4  Total Protein 6.0 - 8.3 g/dL 6.8 6.5 6.8  Total Bilirubin 0.2 - 1.2 mg/dL 0.5 0.6 0.6  Alkaline Phos 39 - 117 U/L 51 49 -  AST 0 - 37 U/L 23 21 23   ALT 0 - 35 U/L 16 14 15     Renal function CrCl cannot be calculated (Patient's most recent lab result is older than the maximum 21 days allowed.).  No results found for: HGBA1C  LDL Cholesterol (Calc)  Date Value Ref Range Status  07/16/2020 93 mg/dL (calc) Final    Comment:    Reference  range: <100 . Desirable range <100 mg/dL for primary prevention;   <70 mg/dL for patients with CHD or diabetic patients  with > or = 2 CHD risk factors. Marland Kitchen LDL-C is now calculated using the Martin-Hopkins  calculation, which is a validated novel method providing  better accuracy than the Friedewald equation in the  estimation of LDL-C.  Cresenciano Genre et al. Annamaria Helling. 8546;270(35): 2061-2068  (http://education.[facility].com/faq/FAQ164)    LDL Cholesterol  Date Value Ref Range Status  06/30/2021 82 0 - 99 mg/dL Final   Direct LDL  Date Value Ref Range Status  01/27/2010 117.5 mg/dL Final    Comment:    See lab report for associated comment(s)     Vascular Imaging: CT angiogram of head and neck personally reviewed in detail.  She has CT evidence of fibromuscular dysplasia in bilateral internal carotid arteries.  Her distal left internal carotid artery near the skull base has a nearly 1 cm fusiform aneurysm without associated mural thrombus.  Yevonne Aline. Stanford Breed, MD [facility] Phone Number: [phone] 08/05/2021 9:01 AM  Total time spent on preparing this encounter including chart review, data review, collecting history, examining the patient, coordinating care for this new patient, 60 minutes.  Portions of this report may have been transcribed using voice recognition software.  Every effort has been made to ensure accuracy; however, inadvertent computerized transcription errors may still be present.

## 2021-08-05 ENCOUNTER — Ambulatory Visit (INDEPENDENT_AMBULATORY_CARE_PROVIDER_SITE_OTHER): Payer: Medicare Other | Admitting: Vascular Surgery

## 2021-08-05 ENCOUNTER — Other Ambulatory Visit: Payer: Self-pay

## 2021-08-05 ENCOUNTER — Encounter: Payer: Self-pay | Admitting: Vascular Surgery

## 2021-08-05 ENCOUNTER — Encounter: Payer: Self-pay | Admitting: Family Medicine

## 2021-08-05 VITALS — BP 145/84 | HR 65 | Temp 99.0°F | Resp 20 | Ht 65.0 in | Wt 152.0 lb

## 2021-08-05 DIAGNOSIS — I720 Aneurysm of carotid artery: Secondary | ICD-10-CM | POA: Diagnosis not present

## 2021-08-05 DIAGNOSIS — I773 Arterial fibromuscular dysplasia: Secondary | ICD-10-CM

## 2021-08-05 NOTE — Telephone Encounter (Signed)
Noted  

## 2021-08-12 ENCOUNTER — Ambulatory Visit (INDEPENDENT_AMBULATORY_CARE_PROVIDER_SITE_OTHER): Payer: Medicare Other

## 2021-08-12 ENCOUNTER — Other Ambulatory Visit: Payer: Self-pay

## 2021-08-12 ENCOUNTER — Ambulatory Visit: Payer: Medicare Other

## 2021-08-12 DIAGNOSIS — Z0000 Encounter for general adult medical examination without abnormal findings: Secondary | ICD-10-CM

## 2021-08-12 NOTE — Patient Instructions (Signed)
Janet Knox , Thank you for taking time to come for your Medicare Wellness Visit. I appreciate your ongoing commitment to your health goals. Please review the following plan we discussed and let me know if I can assist you in the future.   Screening recommendations/referrals: Colonoscopy: Done 09/25/15 repeat every 10 years  Mammogram: Done 10/09/20 repeat every year Bone Density: Done 10/09/20 repeat every 2 years Recommended yearly ophthalmology/optometry visit for glaucoma screening and checkup Recommended yearly dental visit for hygiene and checkup  Vaccinations: Influenza vaccine: Done 06/30/21 Pneumococcal vaccine: Up to date Tdap vaccine: Done 03/22/13 repeat every 10 years  Shingles vaccine: Completed 3/13, 4/13 & 08/26/20   Covid-19:Completed 4/10 & 07/01/17  Advanced directives: Please bring a copy of your health care power of attorney and living will to the office at your convenience.  Conditions/risks identified: lose weight  Next appointment: Follow up in one year for your annual wellness visit    Preventive Care 65 Years and Older, Female Preventive care refers to lifestyle choices and visits with your health care provider that can promote health and wellness. What does preventive care include? A yearly physical exam. This is also called an annual well check. Dental exams once or twice a year. Routine eye exams. Ask your health care provider how often you should have your eyes checked. Personal lifestyle choices, including: Daily care of your teeth and gums. Regular physical activity. Eating a healthy diet. Avoiding tobacco and drug use. Limiting alcohol use. Practicing safe sex. Taking low-dose aspirin every day. Taking vitamin and mineral supplements as recommended by your health care provider. What happens during an annual well check? The services and screenings done by your health care provider during your annual well check will depend on your age, overall health,  lifestyle risk factors, and family history of disease. Counseling  Your health care provider may ask you questions about your: Alcohol use. Tobacco use. Drug use. Emotional well-being. Home and relationship well-being. Sexual activity. Eating habits. History of falls. Memory and ability to understand (cognition). Work and work Statistician. Reproductive health. Screening  You may have the following tests or measurements: Height, weight, and BMI. Blood pressure. Lipid and cholesterol levels. These may be checked every 5 years, or more frequently if you are over 45 years old. Skin check. Lung cancer screening. You may have this screening every year starting at age 47 if you have a 30-pack-year history of smoking and currently smoke or have quit within the past 15 years. Fecal occult blood test (FOBT) of the stool. You may have this test every year starting at age 62. Flexible sigmoidoscopy or colonoscopy. You may have a sigmoidoscopy every 5 years or a colonoscopy every 10 years starting at age 37. Hepatitis C blood test. Hepatitis B blood test. Sexually transmitted disease (STD) testing. Diabetes screening. This is done by checking your blood sugar (glucose) after you have not eaten for a while (fasting). You may have this done every 1-3 years. Bone density scan. This is done to screen for osteoporosis. You may have this done starting at age 98. Mammogram. This may be done every 1-2 years. Talk to your health care provider about how often you should have regular mammograms. Talk with your health care provider about your test results, treatment options, and if necessary, the need for more tests. Vaccines  Your health care provider may recommend certain vaccines, such as: Influenza vaccine. This is recommended every year. Tetanus, diphtheria, and acellular pertussis (Tdap, Td) vaccine. You may  need a Td booster every 10 years. Zoster vaccine. You may need this after age  35. Pneumococcal 13-valent conjugate (PCV13) vaccine. One dose is recommended after age 78. Pneumococcal polysaccharide (PPSV23) vaccine. One dose is recommended after age 72. Talk to your health care provider about which screenings and vaccines you need and how often you need them. This information is not intended to replace advice given to you by your health care provider. Make sure you discuss any questions you have with your health care provider. Document Released: 10/11/2015 Document Revised: 06/03/2016 Document Reviewed: 07/16/2015 Elsevier Interactive Patient Education  2017 [place] Prevention in the Home Falls can cause injuries. They can happen to people of all ages. There are many things you can do to make your home safe and to help prevent falls. What can I do on the outside of my home? Regularly fix the edges of walkways and driveways and fix any cracks. Remove anything that might make you trip as you walk through a door, such as a raised step or threshold. Trim any bushes or trees on the path to your home. Use bright outdoor lighting. Clear any walking paths of anything that might make someone trip, such as rocks or tools. Regularly check to see if handrails are loose or broken. Make sure that both sides of any steps have handrails. Any raised decks and porches should have guardrails on the edges. Have any leaves, snow, or ice cleared regularly. Use sand or salt on walking paths during winter. Clean up any spills in your garage right away. This includes oil or grease spills. What can I do in the bathroom? Use night lights. Install grab bars by the toilet and in the tub and shower. Do not use towel bars as grab bars. Use non-skid mats or decals in the tub or shower. If you need to sit down in the shower, use a plastic, non-slip stool. Keep the floor dry. Clean up any water that spills on the floor as soon as it happens. Remove soap buildup in the tub or shower  regularly. Attach bath mats securely with double-sided non-slip rug tape. Do not have throw rugs and other things on the floor that can make you trip. What can I do in the bedroom? Use night lights. Make sure that you have a light by your bed that is easy to reach. Do not use any sheets or blankets that are too big for your bed. They should not hang down onto the floor. Have a firm chair that has side arms. You can use this for support while you get dressed. Do not have throw rugs and other things on the floor that can make you trip. What can I do in the kitchen? Clean up any spills right away. Avoid walking on wet floors. Keep items that you use a lot in easy-to-reach places. If you need to reach something above you, use a strong step stool that has a grab bar. Keep electrical cords out of the way. Do not use floor polish or wax that makes floors slippery. If you must use wax, use non-skid floor wax. Do not have throw rugs and other things on the floor that can make you trip. What can I do with my stairs? Do not leave any items on the stairs. Make sure that there are handrails on both sides of the stairs and use them. Fix handrails that are broken or loose. Make sure that handrails are as long as the stairways.  Check any carpeting to make sure that it is firmly attached to the stairs. Fix any carpet that is loose or worn. Avoid having throw rugs at the top or bottom of the stairs. If you do have throw rugs, attach them to the floor with carpet tape. Make sure that you have a light switch at the top of the stairs and the bottom of the stairs. If you do not have them, ask someone to add them for you. What else can I do to help prevent falls? Wear shoes that: Do not have high heels. Have rubber bottoms. Are comfortable and fit you well. Are closed at the toe. Do not wear sandals. If you use a stepladder: Make sure that it is fully opened. Do not climb a closed stepladder. Make sure that  both sides of the stepladder are locked into place. Ask someone to hold it for you, if possible. Clearly mark and make sure that you can see: Any grab bars or handrails. First and last steps. Where the edge of each step is. Use tools that help you move around (mobility aids) if they are needed. These include: Canes. Walkers. Scooters. Crutches. Turn on the lights when you go into a dark area. Replace any light bulbs as soon as they burn out. Set up your furniture so you have a clear path. Avoid moving your furniture around. If any of your floors are uneven, fix them. If there are any pets around you, be aware of where they are. Review your medicines with your doctor. Some medicines can make you feel dizzy. This can increase your chance of falling. Ask your doctor what other things that you can do to help prevent falls. This information is not intended to replace advice given to you by your health care provider. Make sure you discuss any questions you have with your health care provider. Document Released: 07/11/2009 Document Revised: 02/20/2016 Document Reviewed: 10/19/2014 Elsevier Interactive Patient Education  2017 [employer].

## 2021-08-12 NOTE — Progress Notes (Addendum)
Virtual Visit via Telephone Note  I connected with  Janet Knox on 08/12/21 at  2:30 PM EST by telephone and verified that I am speaking with the correct person using two identifiers.  Medicare Annual Wellness visit completed telephonically due to Covid-19 pandemic.   Persons participating in this call: This Health Coach and this patient.   Location: Patient: Home Provider: Office   I discussed the limitations, risks, security and privacy concerns of performing an evaluation and management service by telephone and the availability of in person appointments. The patient expressed understanding and agreed to proceed.  Unable to perform video visit due to video visit attempted and failed and/or patient does not have video capability.   Some vital signs may be absent or patient reported.   Willette Brace, LPN   Subjective:   Janet Knox is a 65 y.o. female who presents for an Initial Medicare Annual Wellness Visit.  Review of Systems     Cardiac Risk Factors include: advanced age (>43men, >48 women);hypertension;dyslipidemia     Objective:    There were no vitals filed for this visit. There is no height or weight on file to calculate BMI.  Advanced Directives 08/12/2021 07/16/2020 03/23/2018 08/26/2016  Does Patient Have a Medical Advance Directive? Yes Yes Yes No  Type of [employer] [place];Living will -  Copy of [place] in Chart? No - copy requested - No - copy requested -    Current Medications (verified) Outpatient Encounter Medications as of 08/12/2021  Medication Sig   aspirin 81 MG tablet Take 81 mg by mouth daily.   atorvastatin (LIPITOR) 40 MG tablet Take 1 tablet (40 mg total) by mouth daily.   Cholecalciferol (VITAMIN D3) 5000 UNITS TABS Take 1 tablet by mouth daily.    Coenzyme Q10 (COQ-10) 200 MG CAPS Take 1 capsule by mouth daily.   [employer] SUNSCREEN SPF 30 EX apply   estradiol  (ESTRACE) 2 MG tablet TAKE 1 TABLET DAILY   fenofibrate 54 MG tablet TAKE 1 TABLET DAILY   Ferrous Sulfate 27 MG TABS Take 1 tablet by mouth daily.   losartan (COZAAR) 100 MG tablet TAKE 1 TABLET DAILY   metoprolol tartrate (LOPRESSOR) 50 MG tablet TAKE 1 TABLET TWICE A DAY   Multiple Vitamin (MULTIVITAMIN) tablet Take 1 tablet by mouth daily.   niacin 500 MG tablet Take 2 tablets (1,000 mg total) by mouth daily with breakfast.   Omega-3 Fatty Acids (FISH OIL) 1200 MG CAPS Take 1 capsule by mouth daily.    SUMAtriptan (IMITREX) 50 MG tablet Take 1 tablet (50 mg total) by mouth every 2 (two) hours as needed for migraine. May repeat in 2 hours if headache persists or recurs.   vitamin E 400 UNIT capsule Take 400 Units by mouth daily.   COVID-19 mRNA vaccine, Moderna, 100 MCG/0.5ML injection INJECT AS DIRECTED   cyclobenzaprine (FLEXERIL) 10 MG tablet Take 1 tablet (10 mg total) by mouth 3 (three) times daily as needed for muscle spasms. (Patient not taking: Reported on 08/12/2021)   No facility-administered encounter medications on file as of 08/12/2021.    Allergies (verified) Latex   History: Past Medical History:  Diagnosis Date   Anemia    Hyperlipidemia    Hypertension    Skin cancer    Past Surgical History:  Procedure Laterality Date   ABDOMINAL HYSTERECTOMY     EYE SURGERY Left    Torn Retina  KNEE SURGERY     Family History  Problem Relation Age of Onset   Alzheimer's disease Mother    Hyperlipidemia Mother    Hypertension Mother    Coronary artery disease Paternal Grandfather    Stroke Paternal Grandfather    Hyperlipidemia Father    Hypertension Father    Atrial fibrillation Father    Social History   Socioeconomic History   Marital status: Married    Spouse name: Not on file   Number of children: Not on file   Years of education: Not on file   Highest education level: Not on file  Occupational History   Occupation: [employer]  Tobacco Use    Smoking status: Never   Smokeless tobacco: Never  Vaping Use   Vaping Use: Never used  Substance and Sexual Activity   Alcohol use: Yes    Alcohol/week: 1.0 standard drink    Types: 1 Glasses of wine per week   Drug use: No   Sexual activity: Yes    Partners: Male  Other Topics Concern   Not on file  Social History Narrative   Exercise-- 3 x a week for 1 hour   Social Determinants of Health   Financial Resource Strain: Low Risk    Difficulty of Paying Living Expenses: Not hard at all  Food Insecurity: No Food Insecurity   Worried About Charity fundraiser in the Last Year: Never true   [place] in the Last Year: Never true  Transportation Needs: No Transportation Needs   Lack of Transportation (Medical): No   Lack of Transportation (Non-Medical): No  Physical Activity: Inactive   Days of Exercise per Week: 0 days   Minutes of Exercise per Session: 0 min  Stress: Stress Concern Present   Feeling of Stress : To some extent  Social Connections: Moderately Integrated   Frequency of Communication with Friends and Family: More than three times a week   Frequency of Social Gatherings with Friends and Family: More than three times a week   Attends Religious Services: More than 4 times per year   Active Member of [employer] or Organizations: No   Attends Music therapist: Never   Marital Status: Married    Tobacco Counseling Counseling given: Not Answered   Clinical Intake:  Pre-visit preparation completed: Yes  Pain : No/denies pain     BMI - recorded: 25.29 Nutritional Status: BMI 25 -29 Overweight Nutritional Risks: None  How often do you need to have someone help you when you read instructions, pamphlets, or other written materials from your doctor or pharmacy?: 1 - Never  Diabetic?No  Interpreter Needed?: No  Information entered by :: Charlott Rakes, LPN   Activities of Daily Living In your present state of health, do you have any  difficulty performing the following activities: 08/12/2021  Hearing? N  Vision? N  Difficulty concentrating or making decisions? N  Walking or climbing stairs? N  Dressing or bathing? N  Doing errands, shopping? N  Preparing Food and eating ? N  Using the Toilet? N  In the past six months, have you accidently leaked urine? N  Do you have problems with loss of bowel control? N  Managing your Medications? N  Managing your Finances? N  Housekeeping or managing your Housekeeping? N  Some recent data might be hidden    Patient Care Team: Carollee Herter, Alferd Apa, DO as PCP - General Maisie Fus, MD as Consulting Physician (Obstetrics and  Gynecology) Sherlynn Stalls, MD as Consulting Physician (Ophthalmology) Barbaraann Cao, [place] as Referring Physician (Optometry) Darleen Crocker, MD as Consulting Physician (Ophthalmology)  Indicate any recent Medical Services you may have received from other than [facility] providers in the past year (date may be approximate).     Assessment:   This is a routine wellness examination for Pamalee Leyden.  Hearing/Vision screen Hearing Screening - Comments:: Pt denies any hearing issues  Vision Screening - Comments:: Pt follows up with was Dr Marica Otter and will change 1//1/23 to Dr Bing Plume  Dietary issues and exercise activities discussed: Current Exercise Habits: The patient does not participate in regular exercise at present   Goals Addressed             This Visit's Progress    Patient Stated       Lose weight        Depression Screen PHQ 2/9 Scores 08/12/2021 07/16/2020 04/30/2020 01/13/2018 06/23/2016 03/22/2013  PHQ - 2 Score 0 0 0 0 0 0  PHQ- 9 Score - 0 - - - -    Fall Risk Fall Risk  08/12/2021 07/16/2020 01/13/2018  Falls in the past year? 1 0 No  Number falls in past yr: 1 0 -  Injury with Fall? 1 0 -  Comment bruise - -  Follow up Falls prevention discussed Falls evaluation completed -    FALL RISK PREVENTION PERTAINING TO THE  HOME:  Any stairs in or around the home? Yes  If so, are there any without handrails? No  Home free of loose throw rugs in walkways, pet beds, electrical cords, etc? Yes  Adequate lighting in your home to reduce risk of falls? Yes   ASSISTIVE DEVICES UTILIZED TO PREVENT FALLS:  Life alert? No  Use of a cane, walker or w/c? No  Grab bars in the bathroom? No  Shower chair or bench in shower? No  Elevated toilet seat or a handicapped toilet? No   TIMED UP AND GO:  Was the test performed? No .   Cognitive Function: MMSE - Mini Mental State Exam 07/16/2020  Orientation to time 5  Orientation to Place 5  Registration 3  Attention/ Calculation 5  Recall 3  Language- name 2 objects 2  Language- repeat 1  Language- follow 3 step command 3  Language- read & follow direction 1  Write a sentence 1  Copy design 1  Total score 30     6CIT Screen 08/12/2021  What Year? 0 points  What month? 0 points  What time? 0 points  Count back from 20 0 points  Months in reverse 0 points  Repeat phrase 0 points  Total Score 0    Immunizations Immunization History  Administered Date(s) Administered   Fluad Quad(high Dose 65+) 06/30/2021   Influenza Whole 07/26/2007, 07/20/2008   Influenza,inj,Quad PF,6+ Mos 07/26/2015, 06/23/2016, 07/15/2017, 08/05/2018, 06/21/2019, 07/16/2020   Moderna Covid-19 Vaccine Bivalent Booster 67yrs & up 07/25/2021   Moderna SARS-COV2 Booster Vaccination 08/26/2020   Moderna Sars-Covid-2 Vaccination 12/09/2019, 01/09/2020   Pneumococcal Polysaccharide-23 12/31/2015, 12/27/2020   Td 11/03/2002   Tdap 03/22/2013   Zoster Recombinat (Shingrix) 01/05/2017, 07/01/2017   Zoster, Live 08/13/2015    TDAP status: Up to date  Flu Vaccine status: Up to date  Pneumococcal vaccine status: Up to date  Covid-19 vaccine status: Completed vaccines  Qualifies for Shingles Vaccine? Yes   Zostavax completed Yes   Shingrix Completed?: Yes  Screening Tests Health  Maintenance  Topic Date  Due   COVID-19 Vaccine (3 - Moderna risk series) 07/25/2021   MAMMOGRAM  10/09/2021   Pneumonia Vaccine 36+ Years old (3 - PCV) 12/27/2021   DEXA SCAN  10/09/2022   TETANUS/TDAP  03/23/2023   COLONOSCOPY (Pts 45-73yrs Insurance coverage will need to be confirmed)  09/24/2025   INFLUENZA VACCINE  Completed   Hepatitis C Screening  Completed   Zoster Vaccines- Shingrix  Completed   HPV VACCINES  Aged Out    Health Maintenance  Health Maintenance Due  Topic Date Due   COVID-19 Vaccine (3 - Moderna risk series) 07/25/2021    Colorectal cancer screening: Type of screening: Colonoscopy. Completed 09/25/15. Repeat every 10 years  Mammogram status: Completed 10/09/20. Repeat every year  Bone Density status: Completed 10/09/20. Results reflect: Bone density results: NORMAL. Repeat every 2 years.   Additional Screening:  Hepatitis C Screening:  Completed 07/26/15  Vision Screening: Recommended annual ophthalmology exams for early detection of glaucoma and other disorders of the eye. Is the patient up to date with their annual eye exam?  Yes  Who is the provider or what is the name of the office in which the patient attends annual eye exams? Dr Marica Otter will change in 2023 to dr digby If pt is not established with a provider, would they like to be referred to a provider to establish care? No .   Dental Screening: Recommended annual dental exams for proper oral hygiene  Community Resource Referral / Chronic Care Management: CRR required this visit?  No   CCM required this visit?  No      Plan:     I have personally reviewed and noted the following in the patient's chart:   Medical and social history Use of alcohol, tobacco or illicit drugs  Current medications and supplements including opioid prescriptions. Patient is not currently taking opioid prescriptions. Functional ability and status Nutritional status Physical activity Advanced  directives List of other physicians Hospitalizations, surgeries, and ER visits in previous 12 months Vitals Screenings to include cognitive, depression, and falls Referrals and appointments  In addition, I have reviewed and discussed with patient certain preventive protocols, quality metrics, and best practice recommendations. A written personalized care plan for preventive services as well as general preventive health recommendations were provided to patient.     Willette Brace, LPN   [id number]   Nurse Notes: None

## 2021-08-17 ENCOUNTER — Other Ambulatory Visit: Payer: Self-pay

## 2021-08-17 DIAGNOSIS — I720 Aneurysm of carotid artery: Secondary | ICD-10-CM

## 2021-08-18 ENCOUNTER — Other Ambulatory Visit (HOSPITAL_BASED_OUTPATIENT_CLINIC_OR_DEPARTMENT_OTHER): Payer: Self-pay

## 2021-08-18 MED ORDER — MODERNA COVID-19 BIVAL BOOSTER 50 MCG/0.5ML IM SUSP
INTRAMUSCULAR | 0 refills | Status: DC
  Filled 2021-08-18: qty 0.5, 1d supply, fill #0

## 2021-08-19 ENCOUNTER — Encounter: Payer: Self-pay | Admitting: Vascular Surgery

## 2021-09-16 ENCOUNTER — Other Ambulatory Visit: Payer: Self-pay

## 2021-09-16 ENCOUNTER — Ambulatory Visit (HOSPITAL_COMMUNITY)
Admission: RE | Admit: 2021-09-16 | Discharge: 2021-09-16 | Disposition: A | Discharge status: Home / Self Care | Payer: Medicare Other | Source: Ambulatory Visit | Attending: Vascular Surgery | Admitting: Vascular Surgery

## 2021-09-16 DIAGNOSIS — I720 Aneurysm of carotid artery: Secondary | ICD-10-CM | POA: Insufficient documentation

## 2021-10-16 DIAGNOSIS — C44319 Basal cell carcinoma of skin of other parts of face: Secondary | ICD-10-CM | POA: Diagnosis not present

## 2021-11-26 DIAGNOSIS — Z1151 Encounter for screening for human papillomavirus (HPV): Secondary | ICD-10-CM | POA: Diagnosis not present

## 2021-11-26 DIAGNOSIS — Z124 Encounter for screening for malignant neoplasm of cervix: Secondary | ICD-10-CM | POA: Diagnosis not present

## 2021-11-26 DIAGNOSIS — Z1231 Encounter for screening mammogram for malignant neoplasm of breast: Secondary | ICD-10-CM | POA: Diagnosis not present

## 2021-11-26 DIAGNOSIS — Z6825 Body mass index (BMI) 25.0-25.9, adult: Secondary | ICD-10-CM | POA: Diagnosis not present

## 2021-11-26 LAB — HM MAMMOGRAPHY

## 2021-12-03 DIAGNOSIS — C4441 Basal cell carcinoma of skin of scalp and neck: Secondary | ICD-10-CM | POA: Diagnosis not present

## 2021-12-08 ENCOUNTER — Other Ambulatory Visit: Payer: Self-pay | Admitting: Neurosurgery

## 2021-12-08 DIAGNOSIS — I720 Aneurysm of carotid artery: Secondary | ICD-10-CM

## 2021-12-22 ENCOUNTER — Telehealth (HOSPITAL_BASED_OUTPATIENT_CLINIC_OR_DEPARTMENT_OTHER): Payer: Self-pay

## 2021-12-30 ENCOUNTER — Ambulatory Visit (INDEPENDENT_AMBULATORY_CARE_PROVIDER_SITE_OTHER): Payer: Medicare HMO | Admitting: Family Medicine

## 2021-12-30 ENCOUNTER — Encounter: Payer: Self-pay | Admitting: Family Medicine

## 2021-12-30 VITALS — BP 122/70 | HR 68 | Temp 98.0°F | Resp 18 | Ht 65.0 in | Wt 151.0 lb

## 2021-12-30 DIAGNOSIS — I10 Essential (primary) hypertension: Secondary | ICD-10-CM | POA: Diagnosis not present

## 2021-12-30 DIAGNOSIS — Z8669 Personal history of other diseases of the nervous system and sense organs: Secondary | ICD-10-CM | POA: Diagnosis not present

## 2021-12-30 DIAGNOSIS — G8929 Other chronic pain: Secondary | ICD-10-CM

## 2021-12-30 DIAGNOSIS — I720 Aneurysm of carotid artery: Secondary | ICD-10-CM | POA: Diagnosis not present

## 2021-12-30 DIAGNOSIS — E785 Hyperlipidemia, unspecified: Secondary | ICD-10-CM

## 2021-12-30 DIAGNOSIS — Z780 Asymptomatic menopausal state: Secondary | ICD-10-CM | POA: Diagnosis not present

## 2021-12-30 DIAGNOSIS — Z0000 Encounter for general adult medical examination without abnormal findings: Secondary | ICD-10-CM

## 2021-12-30 DIAGNOSIS — M25511 Pain in right shoulder: Secondary | ICD-10-CM

## 2021-12-30 LAB — LIPID PANEL
Cholesterol: 178 mg/dL (ref 0–200)
HDL: 64.9 mg/dL (ref >39.00)
LDL Cholesterol: 99 mg/dL (ref 0–99)
NonHDL: 113.25
Total CHOL/HDL Ratio: 3
Triglycerides: 72 mg/dL (ref 0.0–149.0)
VLDL: 14.4 mg/dL (ref 0.0–40.0)

## 2021-12-30 LAB — COMPREHENSIVE METABOLIC PANEL
ALT: 16 U/L (ref 0–35)
AST: 24 U/L (ref 0–37)
Albumin: 4.3 g/dL (ref 3.5–5.2)
Alkaline Phosphatase: 50 U/L (ref 39–117)
BUN: 19 mg/dL (ref 6–23)
CO2: 27 mEq/L (ref 19–32)
Calcium: 9 mg/dL (ref 8.4–10.5)
Chloride: 104 mEq/L (ref 96–112)
Creatinine, Ser: 0.62 mg/dL (ref 0.40–1.20)
GFR: 92.78 mL/min (ref >60.00)
Glucose, Bld: 88 mg/dL (ref 70–99)
Potassium: 4.6 mEq/L (ref 3.5–5.1)
Sodium: 139 mEq/L (ref 135–145)
Total Bilirubin: 0.5 mg/dL (ref 0.2–1.2)
Total Protein: 6.5 g/dL (ref 6.0–8.3)

## 2021-12-30 LAB — CBC WITH DIFFERENTIAL/PLATELET
Basophils Absolute: 0 10*3/uL (ref 0.0–0.1)
Basophils Relative: 0.7 % (ref 0.0–3.0)
Eosinophils Absolute: 0.1 10*3/uL (ref 0.0–0.7)
Eosinophils Relative: 2.7 % (ref 0.0–5.0)
HCT: 36.1 % (ref 36.0–46.0)
Hemoglobin: 12.1 g/dL (ref 12.0–15.0)
Lymphocytes Relative: 29.2 % (ref 12.0–46.0)
Lymphs Abs: 1.2 10*3/uL (ref 0.7–4.0)
MCHC: 33.5 g/dL (ref 30.0–36.0)
MCV: 95.3 fl (ref 78.0–100.0)
Monocytes Absolute: 0.4 10*3/uL (ref 0.1–1.0)
Monocytes Relative: 9.2 % (ref 3.0–12.0)
Neutro Abs: 2.3 10*3/uL (ref 1.4–7.7)
Neutrophils Relative %: 58.2 % (ref 43.0–77.0)
Platelets: 256 10*3/uL (ref 150.0–400.0)
RBC: 3.79 Mil/uL — ABNORMAL LOW (ref 3.87–5.11)
RDW: 13.3 % (ref 11.5–15.5)
WBC: 4 10*3/uL (ref 4.0–10.5)

## 2021-12-30 MED ORDER — METOPROLOL TARTRATE 50 MG PO TABS: 50.0000 mg | ORAL_TABLET | Freq: Two times a day (BID) | ORAL | 1 refills | Status: DC

## 2021-12-30 MED ORDER — FENOFIBRATE 54 MG PO TABS: 54.0000 mg | ORAL_TABLET | Freq: Every day | ORAL | 1 refills | Status: DC

## 2021-12-30 MED ORDER — LOSARTAN POTASSIUM 100 MG PO TABS: 100.0000 mg | ORAL_TABLET | Freq: Every day | ORAL | 1 refills | Status: DC

## 2021-12-30 MED ORDER — SUMATRIPTAN SUCCINATE 50 MG PO TABS: 50.0000 mg | ORAL_TABLET | ORAL | 1 refills | Status: DC | PRN

## 2021-12-30 MED ORDER — ESTRADIOL 2 MG PO TABS: 2.0000 mg | ORAL_TABLET | Freq: Every day | ORAL | 1 refills | Status: DC

## 2021-12-30 MED ORDER — ATORVASTATIN CALCIUM 40 MG PO TABS: 40.0000 mg | ORAL_TABLET | Freq: Every day | ORAL | 3 refills | Status: DC

## 2021-12-30 NOTE — Assessment & Plan Note (Signed)
Well controlled, no changes to meds. Encouraged heart healthy diet such as the DASH diet and exercise as tolerated.  °

## 2021-12-30 NOTE — Patient Instructions (Signed)
Cholesterol Content in Foods ?Cholesterol is a waxy, fat-like substance that helps to carry fat in the blood. The body needs cholesterol in small amounts, but too much cholesterol can cause damage to the arteries and heart. ?What foods have cholesterol? ?Cholesterol is found in animal-based foods, such as meat, seafood, and dairy. Generally, low-fat dairy and lean meats have less cholesterol than full-fat dairy and fatty meats. The milligrams of cholesterol per serving (mg per serving) of common cholesterol-containing foods are listed below. ?Meats and other proteins ?Egg -- one large whole egg has 186 mg. ?Veal shank -- 4 oz (113 g) has 141 mg. ?Lean ground turkey (93% lean) -- 4 oz (113 g) has 118 mg. ?Fat-trimmed lamb loin -- 4 oz (113 g) has 106 mg. ?Lean ground beef (90% lean) -- 4 oz (113 g) has 100 mg. ?Lobster -- 3.5 oz (99 g) has 90 mg. ?Pork loin chops -- 4 oz (113 g) has 86 mg. ?Canned salmon -- 3.5 oz (99 g) has 83 mg. ?Fat-trimmed beef top loin -- 4 oz (113 g) has 78 mg. ?Frankfurter -- 1 frank (3.5 oz or 99 g) has 77 mg. ?Crab -- 3.5 oz (99 g) has 71 mg. ?Roasted chicken without skin, white meat -- 4 oz (113 g) has 66 mg. ?Light bologna -- 2 oz (57 g) has 45 mg. ?Deli-cut turkey -- 2 oz (57 g) has 31 mg. ?Canned tuna -- 3.5 oz (99 g) has 31 mg. ?Bacon -- 1 oz (28 g) has 29 mg. ?Oysters and mussels (raw) -- 3.5 oz (99 g) has 25 mg. ?Mackerel -- 1 oz (28 g) has 22 mg. ?Trout -- 1 oz (28 g) has 20 mg. ?Pork sausage -- 1 link (1 oz or 28 g) has 17 mg. ?Salmon -- 1 oz (28 g) has 16 mg. ?Tilapia -- 1 oz (28 g) has 14 mg. ?Dairy ?Soft-serve ice cream -- ? cup (4 oz or 86 g) has 103 mg. ?Whole-milk yogurt -- 1 cup (8 oz or 245 g) has 29 mg. ?Cheddar cheese -- 1 oz (28 g) has 28 mg. ?American cheese -- 1 oz (28 g) has 28 mg. ?Whole milk -- 1 cup (8 oz or 250 mL) has 23 mg. ?2% milk -- 1 cup (8 oz or 250 mL) has 18 mg. ?Cream cheese -- 1 tablespoon (Tbsp) (14.5 g) has 15 mg. ?Cottage cheese -- ? cup (4 oz or 113  g) has 14 mg. ?Low-fat (1%) milk -- 1 cup (8 oz or 250 mL) has 10 mg. ?Sour cream -- 1 Tbsp (12 g) has 8.5 mg. ?Low-fat yogurt -- 1 cup (8 oz or 245 g) has 8 mg. ?Nonfat Greek yogurt -- 1 cup (8 oz or 228 g) has 7 mg. ?Half-and-half cream -- 1 Tbsp (15 mL) has 5 mg. ?Fats and oils ?Cod liver oil -- 1 tablespoon (Tbsp) (13.6 g) has 82 mg. ?Butter -- 1 Tbsp (14 g) has 15 mg. ?Lard -- 1 Tbsp (12.8 g) has 14 mg. ?Bacon grease -- 1 Tbsp (12.9 g) has 14 mg. ?Mayonnaise -- 1 Tbsp (13.8 g) has 5-10 mg. ?Margarine -- 1 Tbsp (14 g) has 3-10 mg. ?The items listed above may not be a complete list of foods with cholesterol. Exact amounts of cholesterol in these foods may vary depending on specific ingredients and brands. Contact a dietitian for more information. ?What foods do not have cholesterol? ?Most plant-based foods do not have cholesterol unless you combine them with a food that has cholesterol.   Foods without cholesterol include: ?Grains and cereals. ?Vegetables. ?Fruits. ?Vegetable oils, such as olive, canola, and sunflower oil. ?Legumes, such as peas, beans, and lentils. ?Nuts and seeds. ?Egg whites. ?The items listed above may not be a complete list of foods that do not have cholesterol. Contact a dietitian for more information. ?Summary ?The body needs cholesterol in small amounts, but too much cholesterol can cause damage to the arteries and heart. ?Cholesterol is found in animal-based foods, such as meat, seafood, and dairy. Generally, low-fat dairy and lean meats have less cholesterol than full-fat dairy and fatty meats. ?This information is not intended to replace advice given to you by your health care provider. Make sure you discuss any questions you have with your health care provider. ?Document Revised: 01/24/2021 Document Reviewed: 01/24/2021 ?Elsevier Patient Education ? 2022 Elsevier Inc. ? ?

## 2021-12-30 NOTE — Assessment & Plan Note (Signed)
ghm utd Check labs  See avs  

## 2021-12-30 NOTE — Assessment & Plan Note (Signed)
Encourage heart healthy diet such as MIND or DASH diet, increase exercise, avoid trans fats, simple carbohydrates and processed foods, consider a krill or fish or flaxseed oil cap daily.  °

## 2021-12-30 NOTE — Progress Notes (Signed)
? ?Established Patient Office Visit ? ?Subjective:  ?Patient ID: Janet Knox, female    DOB: 02-17-1956  Age: 66 y.o. MRN: [id number] ? ?CC:  ?Chief Complaint  ?Patient presents with  ? Hyperlipidemia  ? Hypertension  ? Follow-up  ? ? ?HPI ?[name] presents for f/u bp and cholesterol  she also needs to have CT angio of neck to f/u aneurysm ?Pt also c/o R shoulder pain -- no known injury  ? ?Past Medical History:  ?Diagnosis Date  ? Anemia   ? Hyperlipidemia   ? Hypertension   ? Skin cancer   ? ? ?Past Surgical History:  ?Procedure Laterality Date  ? ABDOMINAL HYSTERECTOMY    ? EYE SURGERY Left   ? Torn Retina  ? KNEE SURGERY    ? ? ?Family History  ?Problem Relation Age of Onset  ? Alzheimer's disease Mother   ? Hyperlipidemia Mother   ? Hypertension Mother   ? Coronary artery disease Paternal Grandfather   ? Stroke Paternal Grandfather   ? Hyperlipidemia Father   ? Hypertension Father   ? Atrial fibrillation Father   ? ? ?Social History  ? ?Socioeconomic History  ? Marital status: Married  ?  Spouse name: Not on file  ? Number of children: Not on file  ? Years of education: Not on file  ? Highest education level: Not on file  ?Occupational History  ? Occupation: [employer]  ?Tobacco Use  ? Smoking status: Never  ? Smokeless tobacco: Never  ?Vaping Use  ? Vaping Use: Never used  ?Substance and Sexual Activity  ? Alcohol use: Yes  ?  Alcohol/week: 1.0 standard drink  ?  Types: 1 Glasses of wine per week  ? Drug use: No  ? Sexual activity: Yes  ?  Partners: Male  ?Other Topics Concern  ? Not on file  ?Social History Narrative  ? Exercise-- 3 x a week for 1 hour  ? ?Social Determinants of Health  ? ?Financial Resource Strain: Low Risk   ? Difficulty of Paying Living Expenses: Not hard at all  ?Food Insecurity: No Food Insecurity  ? Worried About Charity fundraiser in the Last Year: Never true  ? Ran Out of Food in the Last Year: Never true  ?Transportation Needs: No Transportation Needs  ? Lack of  Transportation (Medical): No  ? Lack of Transportation (Non-Medical): No  ?Physical Activity: Inactive  ? Days of Exercise per Week: 0 days  ? Minutes of Exercise per Session: 0 min  ?Stress: Stress Concern Present  ? Feeling of Stress : To some extent  ?Social Connections: Moderately Integrated  ? Frequency of Communication with Friends and Family: More than three times a week  ? Frequency of Social Gatherings with Friends and Family: More than three times a week  ? Attends Religious Services: More than 4 times per year  ? Active Member of Clubs or Organizations: No  ? Attends Archivist Meetings: Never  ? Marital Status: Married  ?Intimate Partner Violence: Not At Risk  ? Fear of Current or Ex-Partner: No  ? Emotionally Abused: No  ? Physically Abused: No  ? Sexually Abused: No  ? ? ?Outpatient Medications Prior to Visit  ?Medication Sig Dispense Refill  ? aspirin 81 MG tablet Take 81 mg by mouth daily.    ? Cholecalciferol (VITAMIN D3) 5000 UNITS TABS Take 1 tablet by mouth daily.     ? Coenzyme Q10 (COQ-10) 200 MG CAPS Take  1 capsule by mouth daily.    ? CVS SUNSCREEN SPF 30 EX apply    ? Ferrous Sulfate 27 MG TABS Take 1 tablet by mouth daily.    ? Multiple Vitamin (MULTIVITAMIN) tablet Take 1 tablet by mouth daily.    ? niacin 500 MG tablet Take 2 tablets (1,000 mg total) by mouth daily with breakfast. 180 tablet 3  ? Omega-3 Fatty Acids (FISH OIL) 1200 MG CAPS Take 1 capsule by mouth daily.     ? vitamin E 400 UNIT capsule Take 400 Units by mouth daily.    ? atorvastatin (LIPITOR) 40 MG tablet Take 1 tablet (40 mg total) by mouth daily. 90 tablet 3  ? estradiol (ESTRACE) 2 MG tablet TAKE 1 TABLET DAILY 90 tablet 1  ? fenofibrate 54 MG tablet TAKE 1 TABLET DAILY 90 tablet 1  ? losartan (COZAAR) 100 MG tablet TAKE 1 TABLET DAILY 90 tablet 1  ? metoprolol tartrate (LOPRESSOR) 50 MG tablet TAKE 1 TABLET TWICE A DAY 180 tablet 1  ? SUMAtriptan (IMITREX) 50 MG tablet Take 1 tablet (50 mg total) by mouth  every 2 (two) hours as needed for migraine. May repeat in 2 hours if headache persists or recurs. 10 tablet 1  ? COVID-19 mRNA bivalent vaccine, Moderna, (MODERNA COVID-19 BIVAL BOOSTER) 50 MCG/0.5ML injection Inject into the muscle. (Patient not taking: Reported on 12/30/2021) 0.5 mL 0  ? cyclobenzaprine (FLEXERIL) 10 MG tablet Take 1 tablet (10 mg total) by mouth 3 (three) times daily as needed for muscle spasms. (Patient not taking: Reported on 08/12/2021) 30 tablet 0  ? ?No facility-administered medications prior to visit.  ? ? ?Allergies  ?Allergen Reactions  ? Latex Rash  ? ? ?ROS ?Review of Systems  ?Constitutional:  Negative for appetite change, diaphoresis, fatigue and unexpected weight change.  ?Eyes:  Negative for pain, redness and visual disturbance.  ?Respiratory:  Negative for cough, chest tightness, shortness of breath and wheezing.   ?Cardiovascular:  Negative for chest pain, palpitations and leg swelling.  ?Endocrine: Negative for cold intolerance, heat intolerance, polydipsia, polyphagia and polyuria.  ?Genitourinary:  Negative for difficulty urinating, dysuria and frequency.  ?Neurological:  Negative for dizziness, light-headedness, numbness and headaches.  ? ?  ?Objective:  ?  ?Physical Exam ?Vitals and nursing note reviewed.  ?Constitutional:   ?   Appearance: She is well-developed.  ?HENT:  ?   Head: Normocephalic and atraumatic.  ?Eyes:  ?   Conjunctiva/sclera: Conjunctivae normal.  ?Neck:  ?   Thyroid: No thyromegaly.  ?   Vascular: No carotid bruit or JVD.  ?Cardiovascular:  ?   Rate and Rhythm: Normal rate and regular rhythm.  ?   Heart sounds: Normal heart sounds. No murmur heard. ?Pulmonary:  ?   Effort: Pulmonary effort is normal. No respiratory distress.  ?   Breath sounds: Normal breath sounds. No wheezing or rales.  ?Chest:  ?   Chest wall: No tenderness.  ?Musculoskeletal:  ?   Cervical back: Normal range of motion and neck supple.  ?Neurological:  ?   Mental Status: She is alert and  oriented to person, place, and time.  ? ? ?BP 122/70 (BP Location: Left Arm, Patient Position: Sitting, Cuff Size: Normal)   Pulse 68   Temp 98 ?F (36.7 ?C) (Oral)   Resp 18   Ht '5\' 5"'$  (1.651 m)   Wt 151 lb (68.5 kg)   SpO2 100%   BMI 25.13 kg/m?  ?Wt Readings from Last 3  Encounters:  ?12/30/21 151 lb (68.5 kg)  ?08/05/21 152 lb (68.9 kg)  ?06/30/21 149 lb 12.8 oz (67.9 kg)  ? ? ? ?Health Maintenance Due  ?Topic Date Due  ? COVID-19 Vaccine (3 - Moderna risk series) 07/25/2021  ? Pneumonia Vaccine 32+ Years old (2 - PCV) 12/27/2021  ? ? ?There are no preventive care reminders to display for this patient. ? ?Lab Results  ?Component Value Date  ? TSH 4.23 06/30/2021  ? ?Lab Results  ?Component Value Date  ? WBC 4.2 03/06/2019  ? HGB 11.9 (L) 03/06/2019  ? HCT 36.3 03/06/2019  ? MCV 95.5 03/06/2019  ? PLT 236.0 03/06/2019  ? ?Lab Results  ?Component Value Date  ? NA 140 06/30/2021  ? K 4.3 06/30/2021  ? CO2 27 06/30/2021  ? GLUCOSE 84 06/30/2021  ? BUN 18 06/30/2021  ? CREATININE 0.62 06/30/2021  ? BILITOT 0.5 06/30/2021  ? ALKPHOS 51 06/30/2021  ? AST 23 06/30/2021  ? ALT 16 06/30/2021  ? PROT 6.8 06/30/2021  ? ALBUMIN 4.2 06/30/2021  ? CALCIUM 9.2 06/30/2021  ? ANIONGAP 10 08/26/2016  ? GFR 93.11 06/30/2021  ? ?Lab Results  ?Component Value Date  ? CHOL 166 06/30/2021  ? ?Lab Results  ?Component Value Date  ? HDL 65.10 06/30/2021  ? ?Lab Results  ?Component Value Date  ? [place] 82 06/30/2021  ? ?Lab Results  ?Component Value Date  ? TRIG 97.0 06/30/2021  ? ?Lab Results  ?Component Value Date  ? CHOLHDL 3 06/30/2021  ? ?No results found for: HGBA1C ? ?  ?Assessment & Plan:  ? ?Problem List Items Addressed This Visit   ? ?  ? Unprioritized  ? Preventative health care  ? Relevant Medications  ? estradiol (ESTRACE) 2 MG tablet  ? fenofibrate 54 MG tablet  ? History of migraine  ? Relevant Medications  ? SUMAtriptan (IMITREX) 50 MG tablet  ? Essential hypertension - Primary  ? Relevant Medications  ? metoprolol  tartrate (LOPRESSOR) 50 MG tablet  ? losartan (COZAAR) 100 MG tablet  ? fenofibrate 54 MG tablet  ? atorvastatin (LIPITOR) 40 MG tablet  ? Other Relevant Orders  ? CBC with Differential/Platelet  ? Comprehensive meta

## 2021-12-31 DIAGNOSIS — H40013 Open angle with borderline findings, low risk, bilateral: Secondary | ICD-10-CM | POA: Diagnosis not present

## 2021-12-31 DIAGNOSIS — Z961 Presence of intraocular lens: Secondary | ICD-10-CM | POA: Diagnosis not present

## 2021-12-31 DIAGNOSIS — H52223 Regular astigmatism, bilateral: Secondary | ICD-10-CM | POA: Diagnosis not present

## 2021-12-31 DIAGNOSIS — H43811 Vitreous degeneration, right eye: Secondary | ICD-10-CM | POA: Diagnosis not present

## 2021-12-31 DIAGNOSIS — H33002 Unspecified retinal detachment with retinal break, left eye: Secondary | ICD-10-CM | POA: Diagnosis not present

## 2022-01-06 ENCOUNTER — Encounter (HOSPITAL_BASED_OUTPATIENT_CLINIC_OR_DEPARTMENT_OTHER): Payer: Self-pay

## 2022-01-06 ENCOUNTER — Ambulatory Visit (HOSPITAL_BASED_OUTPATIENT_CLINIC_OR_DEPARTMENT_OTHER)
Admission: RE | Admit: 2022-01-06 | Discharge: 2022-01-06 | Disposition: A | Discharge status: Home / Self Care | Payer: Medicare HMO | Source: Ambulatory Visit | Attending: Family Medicine | Admitting: Family Medicine

## 2022-01-06 DIAGNOSIS — I720 Aneurysm of carotid artery: Secondary | ICD-10-CM | POA: Insufficient documentation

## 2022-01-06 DIAGNOSIS — M25511 Pain in right shoulder: Secondary | ICD-10-CM | POA: Diagnosis not present

## 2022-01-06 DIAGNOSIS — M7541 Impingement syndrome of right shoulder: Secondary | ICD-10-CM | POA: Diagnosis not present

## 2022-01-06 MED ORDER — IOHEXOL 350 MG/ML SOLN
100.0000 mL | Freq: Once | INTRAVENOUS | Status: AC | PRN
  Administered 2022-01-06: 75 mL via INTRAVENOUS

## 2022-01-16 DIAGNOSIS — L820 Inflamed seborrheic keratosis: Secondary | ICD-10-CM | POA: Diagnosis not present

## 2022-01-16 DIAGNOSIS — Z85828 Personal history of other malignant neoplasm of skin: Secondary | ICD-10-CM | POA: Diagnosis not present

## 2022-01-16 DIAGNOSIS — L821 Other seborrheic keratosis: Secondary | ICD-10-CM | POA: Diagnosis not present

## 2022-01-16 DIAGNOSIS — L814 Other melanin hyperpigmentation: Secondary | ICD-10-CM | POA: Diagnosis not present

## 2022-01-16 DIAGNOSIS — D229 Melanocytic nevi, unspecified: Secondary | ICD-10-CM | POA: Diagnosis not present

## 2022-01-22 DIAGNOSIS — I720 Aneurysm of carotid artery: Secondary | ICD-10-CM | POA: Diagnosis not present

## 2022-02-27 ENCOUNTER — Other Ambulatory Visit: Payer: Self-pay | Admitting: Family Medicine

## 2022-02-27 DIAGNOSIS — Z8669 Personal history of other diseases of the nervous system and sense organs: Secondary | ICD-10-CM

## 2022-03-20 ENCOUNTER — Encounter: Payer: Self-pay | Admitting: Family Medicine

## 2022-03-20 ENCOUNTER — Ambulatory Visit (INDEPENDENT_AMBULATORY_CARE_PROVIDER_SITE_OTHER): Payer: Medicare HMO | Admitting: Family Medicine

## 2022-03-20 VITALS — BP 148/56 | HR 57 | Ht 65.0 in | Wt 151.8 lb

## 2022-03-20 DIAGNOSIS — R42 Dizziness and giddiness: Secondary | ICD-10-CM

## 2022-03-20 LAB — COMPREHENSIVE METABOLIC PANEL
ALT: 14 U/L (ref 0–35)
AST: 22 U/L (ref 0–37)
Albumin: 4.1 g/dL (ref 3.5–5.2)
Alkaline Phosphatase: 46 U/L (ref 39–117)
BUN: 18 mg/dL (ref 6–23)
CO2: 30 mEq/L (ref 19–32)
Calcium: 9.7 mg/dL (ref 8.4–10.5)
Chloride: 104 mEq/L (ref 96–112)
Creatinine, Ser: 0.72 mg/dL (ref 0.40–1.20)
GFR: 86.98 mL/min (ref >60.00)
Glucose, Bld: 93 mg/dL (ref 70–99)
Potassium: 4.7 mEq/L (ref 3.5–5.1)
Sodium: 141 mEq/L (ref 135–145)
Total Bilirubin: 0.4 mg/dL (ref 0.2–1.2)
Total Protein: 6.6 g/dL (ref 6.0–8.3)

## 2022-03-20 LAB — CBC
HCT: 37.7 % (ref 36.0–46.0)
Hemoglobin: 12.4 g/dL (ref 12.0–15.0)
MCHC: 33 g/dL (ref 30.0–36.0)
MCV: 96.8 fl (ref 78.0–100.0)
Platelets: 262 10*3/uL (ref 150.0–400.0)
RBC: 3.9 Mil/uL (ref 3.87–5.11)
RDW: 13.2 % (ref 11.5–15.5)
WBC: 4.7 10*3/uL (ref 4.0–10.5)

## 2022-05-08 IMAGING — CT CT ANGIO NECK
2 of 7 series · 8 of 33 positions shown · non-contrast
Comparison: CTA head/neck 07/25/2021

CLINICAL DATA: Follow-up left carotid artery aneurysm



[Series 5: cta neck · axial · 0.50mm/px · z∈[-226,-146]mm · 2 of 120 slices shown]
[im 40/120  soft-tissue]
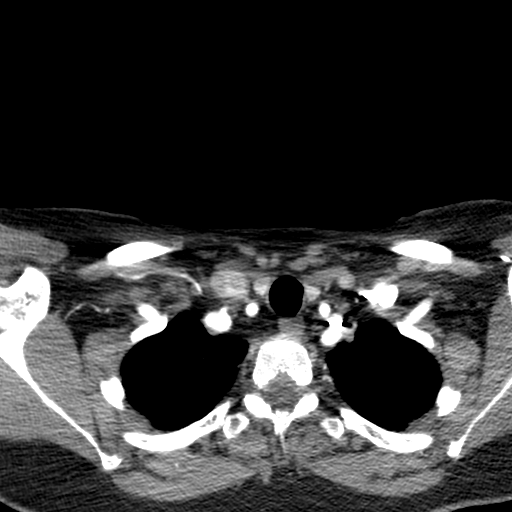
[im 80/120  soft-tissue]
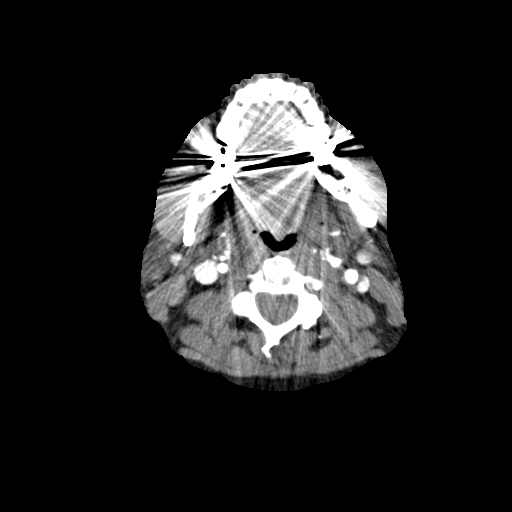

[Series 7: ax thin · axial · 0.46mm/px · z∈[-269,-99]mm · 6 of 240 slices shown]
[im 35/240  soft-tissue]
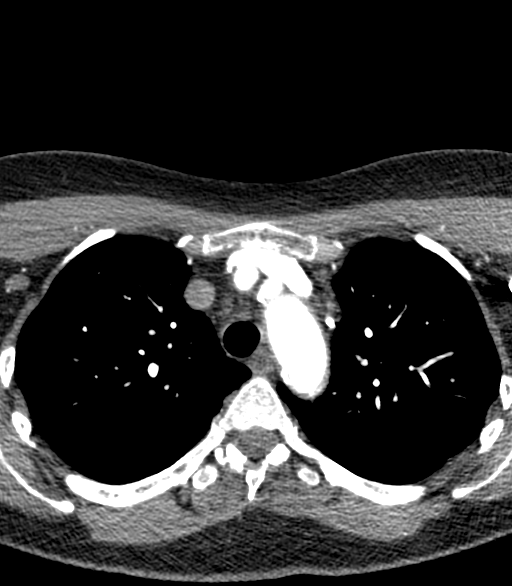
[im 69/240  bone]
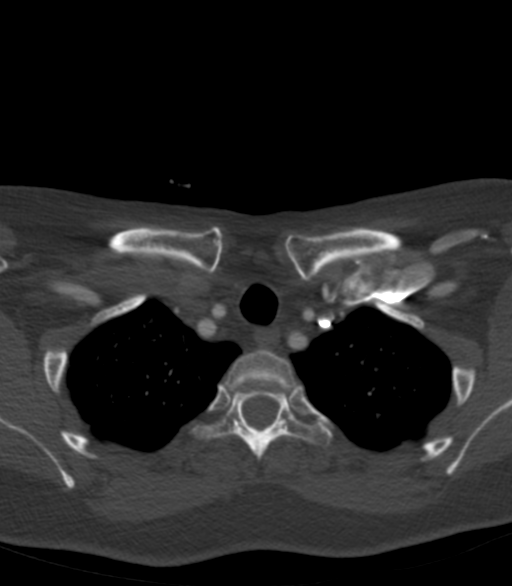
[im 103/240  soft-tissue]
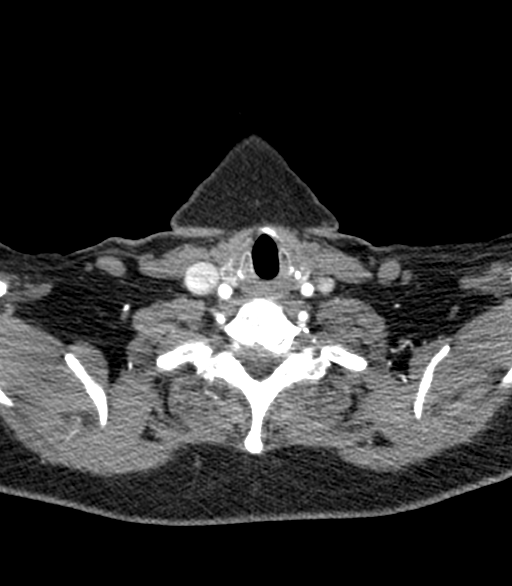
[im 137/240  bone]
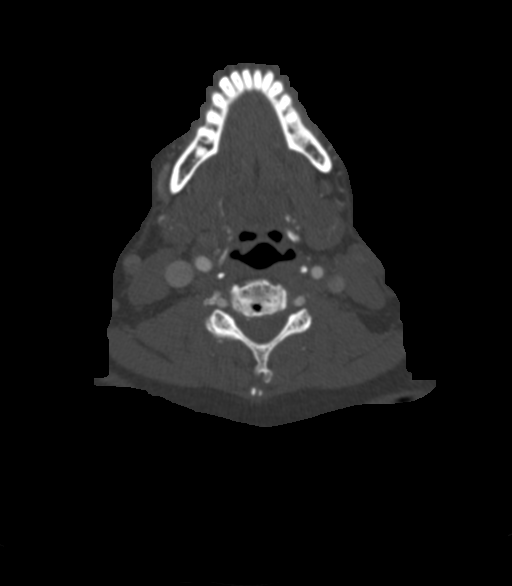
[im 171/240  soft-tissue]
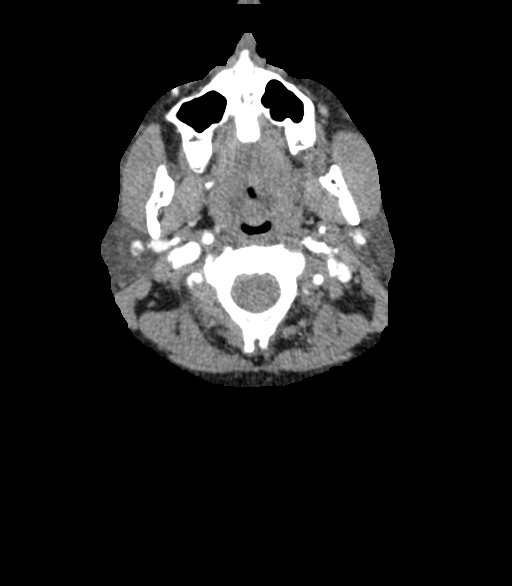
[im 205/240  bone]
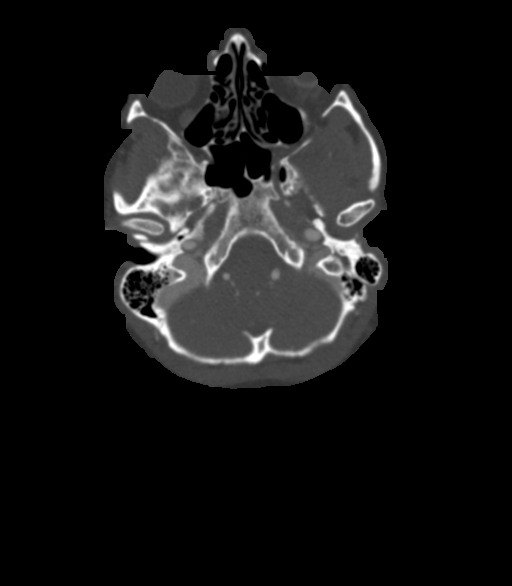

[8 of 33 positions shown; findings below may reference images not displayed]

RADIATION DOSE REDUCTION: This exam was performed according to the
departmental dose-optimization program which includes automated
exposure control, adjustment of the mA and/or kV according to
patient size and/or use of iterative reconstruction technique.

CONTRAST:  75mL OMNIPAQUE IOHEXOL 350 MG/ML SOLN
FINDINGS: Aortic arch: There is mild calcified atherosclerotic plaque of the
aortic arch. The origins of the major branch vessels are patent. The
subclavian arteries are patent to the level imaged.

Right carotid system: The right common carotid artery is patent. The
right internal and external carotid arteries are patent, with no
atherosclerotic disease or significant stenosis. There is beading of
the distal right internal carotid artery consistent with
fibromuscular dysplasia, similar to the prior study. There is no
evidence of dissection or aneurysm.

Left carotid system: The left common carotid artery is patent. There
is mild calcified plaque in the proximal left internal carotid
artery without hemodynamically significant stenosis. There is
beading of the distal left internal carotid artery consistent with
fibromuscular dysplasia. Fusiform aneurysmal dilation of the high
cervical left internal carotid artery at the skull basemeasuring up
to 1.0 cm in width and 1.1 cm in length measured in the coronal
plane is unchanged. There is no evidence of dissection. Left
external carotid artery is patent.

Vertebral arteries: There is mild beading of the distal V2/V3
segments bilaterally consistent with fibromuscular dysplasia, not
significantly changed. Otherwise, the vertebral arteries are patent,
without hemodynamically significant stenosis or occlusion. There is
no evidence of dissection or aneurysm.

Skeleton: There is multilevel degenerative change of the cervical
spine. There is no acute osseous abnormality or suspicious osseous
lesion.

Other neck: Bilateral lens implants and a left scleral buckle are
noted. The soft tissues are otherwise unremarkable.

Upper chest: The imaged lung apices are clear.
IMPRESSION: 1. Stable findings of fibromuscular dysplasia involving the
bilateral internal carotid and vertebral arteries.
2. Unchanged fusiform aneurysmal dilation of the distal left
internal carotid artery at the skull base measuring up to 1.0 cm x
1.1 cm.

## 2022-05-18 ENCOUNTER — Other Ambulatory Visit: Payer: Self-pay | Admitting: Family Medicine

## 2022-05-18 DIAGNOSIS — Z780 Asymptomatic menopausal state: Secondary | ICD-10-CM

## 2022-05-18 DIAGNOSIS — I10 Essential (primary) hypertension: Secondary | ICD-10-CM

## 2022-05-18 DIAGNOSIS — Z0000 Encounter for general adult medical examination without abnormal findings: Secondary | ICD-10-CM

## 2022-05-21 DIAGNOSIS — M79671 Pain in right foot: Secondary | ICD-10-CM | POA: Diagnosis not present

## 2022-05-21 DIAGNOSIS — S92301A Fracture of unspecified metatarsal bone(s), right foot, initial encounter for closed fracture: Secondary | ICD-10-CM | POA: Diagnosis not present

## 2022-05-21 DIAGNOSIS — M25571 Pain in right ankle and joints of right foot: Secondary | ICD-10-CM | POA: Diagnosis not present

## 2022-06-08 DIAGNOSIS — M79671 Pain in right foot: Secondary | ICD-10-CM | POA: Diagnosis not present

## 2022-06-08 DIAGNOSIS — S92351A Displaced fracture of fifth metatarsal bone, right foot, initial encounter for closed fracture: Secondary | ICD-10-CM | POA: Diagnosis not present

## 2022-07-06 DIAGNOSIS — S92351A Displaced fracture of fifth metatarsal bone, right foot, initial encounter for closed fracture: Secondary | ICD-10-CM | POA: Diagnosis not present

## 2022-07-07 ENCOUNTER — Encounter: Payer: Medicare HMO | Admitting: Family Medicine

## 2022-07-07 ENCOUNTER — Telehealth: Payer: Self-pay | Admitting: Family Medicine

## 2022-07-07 DIAGNOSIS — E785 Hyperlipidemia, unspecified: Secondary | ICD-10-CM

## 2022-07-07 DIAGNOSIS — E041 Nontoxic single thyroid nodule: Secondary | ICD-10-CM

## 2022-07-07 DIAGNOSIS — I10 Essential (primary) hypertension: Secondary | ICD-10-CM

## 2022-07-07 NOTE — Telephone Encounter (Signed)
Patient would like labs insert before her afternoon appt for CPE  Can we do that for her?

## 2022-07-08 NOTE — Telephone Encounter (Signed)
Orders have been placed.

## 2022-07-22 ENCOUNTER — Other Ambulatory Visit (INDEPENDENT_AMBULATORY_CARE_PROVIDER_SITE_OTHER): Payer: Medicare HMO

## 2022-07-22 DIAGNOSIS — E041 Nontoxic single thyroid nodule: Secondary | ICD-10-CM

## 2022-07-22 DIAGNOSIS — E785 Hyperlipidemia, unspecified: Secondary | ICD-10-CM

## 2022-07-22 DIAGNOSIS — I10 Essential (primary) hypertension: Secondary | ICD-10-CM | POA: Diagnosis not present

## 2022-07-22 LAB — CBC WITH DIFFERENTIAL/PLATELET
Basophils Absolute: 0 10*3/uL (ref 0.0–0.1)
Basophils Relative: 0.8 % (ref 0.0–3.0)
Eosinophils Absolute: 0.1 10*3/uL (ref 0.0–0.7)
Eosinophils Relative: 2.4 % (ref 0.0–5.0)
HCT: 35.7 % — ABNORMAL LOW (ref 36.0–46.0)
Hemoglobin: 11.9 g/dL — ABNORMAL LOW (ref 12.0–15.0)
Lymphocytes Relative: 28.6 % (ref 12.0–46.0)
Lymphs Abs: 1.2 10*3/uL (ref 0.7–4.0)
MCHC: 33.3 g/dL (ref 30.0–36.0)
MCV: 95.2 fl (ref 78.0–100.0)
Monocytes Absolute: 0.4 10*3/uL (ref 0.1–1.0)
Monocytes Relative: 9.2 % (ref 3.0–12.0)
Neutro Abs: 2.5 10*3/uL (ref 1.4–7.7)
Neutrophils Relative %: 59 % (ref 43.0–77.0)
Platelets: 259 10*3/uL (ref 150.0–400.0)
RBC: 3.75 Mil/uL — ABNORMAL LOW (ref 3.87–5.11)
RDW: 13.6 % (ref 11.5–15.5)
WBC: 4.2 10*3/uL (ref 4.0–10.5)

## 2022-07-22 LAB — COMPREHENSIVE METABOLIC PANEL
ALT: 16 U/L (ref 0–35)
AST: 23 U/L (ref 0–37)
Albumin: 4.2 g/dL (ref 3.5–5.2)
Alkaline Phosphatase: 43 U/L (ref 39–117)
BUN: 18 mg/dL (ref 6–23)
CO2: 28 mEq/L (ref 19–32)
Calcium: 9.2 mg/dL (ref 8.4–10.5)
Chloride: 105 mEq/L (ref 96–112)
Creatinine, Ser: 0.61 mg/dL (ref 0.40–1.20)
GFR: 92.78 mL/min (ref >60.00)
Glucose, Bld: 89 mg/dL (ref 70–99)
Potassium: 4.6 mEq/L (ref 3.5–5.1)
Sodium: 140 mEq/L (ref 135–145)
Total Bilirubin: 0.6 mg/dL (ref 0.2–1.2)
Total Protein: 6.7 g/dL (ref 6.0–8.3)

## 2022-07-22 LAB — LIPID PANEL
Cholesterol: 186 mg/dL (ref 0–200)
HDL: 64.8 mg/dL (ref >39.00)
LDL Cholesterol: 100 mg/dL — ABNORMAL HIGH (ref 0–99)
NonHDL: 121.46
Total CHOL/HDL Ratio: 3
Triglycerides: 108 mg/dL (ref 0.0–149.0)
VLDL: 21.6 mg/dL (ref 0.0–40.0)

## 2022-07-22 LAB — TSH: TSH: 3.89 u[IU]/mL (ref 0.35–5.50)

## 2022-08-04 ENCOUNTER — Encounter: Payer: Self-pay | Admitting: Family Medicine

## 2022-08-04 ENCOUNTER — Ambulatory Visit (INDEPENDENT_AMBULATORY_CARE_PROVIDER_SITE_OTHER): Payer: Medicare HMO | Admitting: Family Medicine

## 2022-08-04 VITALS — BP 130/80 | HR 67 | Temp 97.8°F | Resp 18 | Ht 65.0 in | Wt 152.4 lb

## 2022-08-04 DIAGNOSIS — Z8669 Personal history of other diseases of the nervous system and sense organs: Secondary | ICD-10-CM | POA: Diagnosis not present

## 2022-08-04 DIAGNOSIS — Z0000 Encounter for general adult medical examination without abnormal findings: Secondary | ICD-10-CM

## 2022-08-04 DIAGNOSIS — E785 Hyperlipidemia, unspecified: Secondary | ICD-10-CM | POA: Diagnosis not present

## 2022-08-04 DIAGNOSIS — Z780 Asymptomatic menopausal state: Secondary | ICD-10-CM | POA: Diagnosis not present

## 2022-08-04 DIAGNOSIS — Z23 Encounter for immunization: Secondary | ICD-10-CM | POA: Diagnosis not present

## 2022-08-04 DIAGNOSIS — I10 Essential (primary) hypertension: Secondary | ICD-10-CM

## 2022-08-04 MED ORDER — FENOFIBRATE 54 MG PO TABS: 54.0000 mg | ORAL_TABLET | Freq: Every day | ORAL | 3 refills | Status: DC

## 2022-08-04 MED ORDER — SUMATRIPTAN SUCCINATE 50 MG PO TABS: ORAL_TABLET | ORAL | 0 refills | Status: DC

## 2022-08-04 MED ORDER — METOPROLOL TARTRATE 50 MG PO TABS: 50.0000 mg | ORAL_TABLET | Freq: Two times a day (BID) | ORAL | 3 refills | Status: DC

## 2022-08-04 MED ORDER — ATORVASTATIN CALCIUM 40 MG PO TABS: 40.0000 mg | ORAL_TABLET | Freq: Every day | ORAL | 3 refills | Status: DC

## 2022-08-04 MED ORDER — ESTRADIOL 2 MG PO TABS: 2.0000 mg | ORAL_TABLET | Freq: Every day | ORAL | 3 refills | Status: DC

## 2022-08-04 NOTE — Assessment & Plan Note (Signed)
Well controlled, no changes to meds. Encouraged heart healthy diet such as the DASH diet and exercise as tolerated.  °

## 2022-08-04 NOTE — Patient Instructions (Signed)
Preventive Care 65 Years and Older, Female Preventive care refers to lifestyle choices and visits with your health care provider that can promote health and wellness. Preventive care visits are also called wellness exams. What can I expect for my preventive care visit? Counseling Your health care provider may ask you questions about your: Medical history, including: Past medical problems. Family medical history. Pregnancy and menstrual history. History of falls. Current health, including: Memory and ability to understand (cognition). Emotional well-being. Home life and relationship well-being. Sexual activity and sexual health. Lifestyle, including: Alcohol, nicotine or tobacco, and drug use. Access to firearms. Diet, exercise, and sleep habits. Work and work environment. Sunscreen use. Safety issues such as seatbelt and bike helmet use. Physical exam Your health care provider will check your: Height and weight. These may be used to calculate your BMI (body mass index). BMI is a measurement that tells if you are at a healthy weight. Waist circumference. This measures the distance around your waistline. This measurement also tells if you are at a healthy weight and may help predict your risk of certain diseases, such as type 2 diabetes and high blood pressure. Heart rate and blood pressure. Body temperature. Skin for abnormal spots. What immunizations do I need?  Vaccines are usually given at various ages, according to a schedule. Your health care provider will recommend vaccines for you based on your age, medical history, and lifestyle or other factors, such as travel or where you work. What tests do I need? Screening Your health care provider may recommend screening tests for certain conditions. This may include: Lipid and cholesterol levels. Hepatitis C test. Hepatitis B test. HIV (human immunodeficiency virus) test. STI (sexually transmitted infection) testing, if you are at  risk. Lung cancer screening. Colorectal cancer screening. Diabetes screening. This is done by checking your blood sugar (glucose) after you have not eaten for a while (fasting). Mammogram. Talk with your health care provider about how often you should have regular mammograms. BRCA-related cancer screening. This may be done if you have a family history of breast, ovarian, tubal, or peritoneal cancers. Bone density scan. This is done to screen for osteoporosis. Talk with your health care provider about your test results, treatment options, and if necessary, the need for more tests. Follow these instructions at home: Eating and drinking  Eat a diet that includes fresh fruits and vegetables, whole grains, lean protein, and low-fat dairy products. Limit your intake of foods with high amounts of sugar, saturated fats, and salt. Take vitamin and mineral supplements as recommended by your health care provider. Do not drink alcohol if your health care provider tells you not to drink. If you drink alcohol: Limit how much you have to 0-1 drink a day. Know how much alcohol is in your drink. In the [place] one drink equals one 12 oz bottle of beer (355 mL), one 5 oz glass of wine (148 mL), or one 1 oz glass of hard liquor (44 mL). Lifestyle Brush your teeth every morning and night with fluoride toothpaste. Floss one time each day. Exercise for at least 30 minutes 5 or more days each week. Do not use any products that contain nicotine or tobacco. These products include cigarettes, chewing tobacco, and vaping devices, such as e-cigarettes. If you need help quitting, ask your health care provider. Do not use drugs. If you are sexually active, practice safe sex. Use a condom or other form of protection in order to prevent STIs. Take aspirin only as told by   your health care provider. Make sure that you understand how much to take and what form to take. Work with your health care provider to find out whether it  is safe and beneficial for you to take aspirin daily. Ask your health care provider if you need to take a cholesterol-lowering medicine (statin). Find healthy ways to manage stress, such as: Meditation, yoga, or listening to music. Journaling. Talking to a trusted person. Spending time with friends and family. Minimize exposure to UV radiation to reduce your risk of skin cancer. Safety Always wear your seat belt while driving or riding in a vehicle. Do not drive: If you have been drinking alcohol. Do not ride with someone who has been drinking. When you are tired or distracted. While texting. If you have been using any mind-altering substances or drugs. Wear a helmet and other protective equipment during sports activities. If you have firearms in your house, make sure you follow all gun safety procedures. What's next? Visit your health care provider once a year for an annual wellness visit. Ask your health care provider how often you should have your eyes and teeth checked. Stay up to date on all vaccines. This information is not intended to replace advice given to you by your health care provider. Make sure you discuss any questions you have with your health care provider. Document Revised: 03/12/2021 Document Reviewed: 03/12/2021 Elsevier Patient Education  2023 Elsevier Inc.  

## 2022-08-04 NOTE — Progress Notes (Addendum)
Subjective:   By signing my name below, I, Shehryar Baig, attest that this documentation has been prepared under the direction and in the presence of Ann Held, DO. 08/04/2022    Patient ID: Janet Knox, female    DOB: [birth year], 66 y.o.   MRN: [id number]  Chief Complaint  Patient presents with   Annual Exam    Pt states not fast. Pt has had blood work    HPI Patient is in today for a comprehesnive physical exam.  She reports falling in August, 2023 due to tripping over her high heals. She reports fracturing her foot from the fall. It is healing well at this time.  She denies having any fever, new muscle pain, new joint pain, new moles, congestion, sinus pain, sore throat, chest pain, palpations, cough, SOB, wheezing, n/v/d, constipation, blood in stool, dysuria, frequency, hematuria, or headaches at this time. She is exercising regularly by walking. She is maintaining a healthy diet.  She has no changes to her family medical history. She has no new surgical procedure to report.    Past Medical History:  Diagnosis Date   Anemia    Hyperlipidemia    Hypertension    Skin cancer     Past Surgical History:  Procedure Laterality Date   ABDOMINAL HYSTERECTOMY     EYE SURGERY Left    Torn Retina   KNEE SURGERY      Family History  Problem Relation Age of Onset   Alzheimer's disease Mother    Hyperlipidemia Mother    Hypertension Mother    Coronary artery disease Paternal Grandfather    Stroke Paternal Grandfather    Hyperlipidemia Father    Hypertension Father    Atrial fibrillation Father     Social History   Socioeconomic History   Marital status: Married    Spouse name: Not on file   Number of children: Not on file   Years of education: Not on file   Highest education level: Not on file  Occupational History   Occupation: [employer]  Tobacco Use   Smoking status: Never   Smokeless tobacco: Never  Vaping Use   Vaping Use: Never used   Substance and Sexual Activity   Alcohol use: Yes    Alcohol/week: 1.0 standard drink of alcohol    Types: 1 Glasses of wine per week   Drug use: No   Sexual activity: Yes    Partners: Male  Other Topics Concern   Not on file  Social History Narrative   Exercise-- 3 x a week for 1 hour   Social Determinants of Health   Financial Resource Strain: Low Risk  (08/12/2021)   Overall Financial Resource Strain (CARDIA)    Difficulty of Paying Living Expenses: Not hard at all  Food Insecurity: No Food Insecurity (08/12/2021)   Hunger Vital Sign    Worried About Running Out of Food in the Last Year: Never true    Ran Out of Food in the Last Year: Never true  Transportation Needs: No Transportation Needs (08/12/2021)   PRAPARE - Hydrologist (Medical): No    Lack of Transportation (Non-Medical): No  Physical Activity: Inactive (08/12/2021)   Exercise Vital Sign    Days of Exercise per Week: 0 days    Minutes of Exercise per Session: 0 min  Stress: Stress Concern Present (08/12/2021)   [place]    Feeling of Stress :  To some extent  Social Connections: Moderately Integrated (08/12/2021)   Social Connection and Isolation Panel [NHANES]    Frequency of Communication with Friends and Family: More than three times a week    Frequency of Social Gatherings with Friends and Family: More than three times a week    Attends Religious Services: More than 4 times per year    Active Member of [employer] or Organizations: No    Attends Archivist Meetings: Never    Marital Status: Married  Human resources officer Violence: Not At Risk (08/12/2021)   Humiliation, Afraid, Rape, and Kick questionnaire    Fear of Current or Ex-Partner: No    Emotionally Abused: No    Physically Abused: No    Sexually Abused: No    Outpatient Medications Prior to Visit  Medication Sig Dispense Refill   aspirin 81 MG  tablet Take 81 mg by mouth daily.     Cholecalciferol (VITAMIN D3) 5000 UNITS TABS Take 1 tablet by mouth daily.      Coenzyme Q10 (COQ-10) 200 MG CAPS Take 1 capsule by mouth daily.     [employer] SUNSCREEN SPF 30 EX apply     Ferrous Sulfate 27 MG TABS Take 1 tablet by mouth daily.     losartan (COZAAR) 100 MG tablet Take 1 tablet (100 mg total) by mouth daily. 90 tablet 1   Multiple Vitamin (MULTIVITAMIN) tablet Take 1 tablet by mouth daily.     niacin 500 MG tablet Take 2 tablets (1,000 mg total) by mouth daily with breakfast. 180 tablet 3   Omega-3 Fatty Acids (FISH OIL) 1200 MG CAPS Take 1 capsule by mouth daily.      vitamin E 400 UNIT capsule Take 400 Units by mouth daily.     atorvastatin (LIPITOR) 40 MG tablet Take 1 tablet (40 mg total) by mouth daily. 90 tablet 3   estradiol (ESTRACE) 2 MG tablet Take 1 tablet (2 mg total) by mouth daily. 90 tablet 1   fenofibrate 54 MG tablet Take 1 tablet (54 mg total) by mouth daily. 90 tablet 1   metoprolol tartrate (LOPRESSOR) 50 MG tablet Take 1 tablet (50 mg total) by mouth 2 (two) times daily. 180 tablet 1   SUMAtriptan (IMITREX) 50 MG tablet TAKE 1 TABLET AS NEEDED FOR MIGRAINE, MAY REPEAT IN 2 HOURS IF HEADACHE PERSISTS OR RECURS 9 tablet 0   No facility-administered medications prior to visit.    Allergies  Allergen Reactions   Latex Rash    Review of Systems  Constitutional:  Negative for fever and malaise/fatigue.  HENT:  Negative for congestion, sinus pain and sore throat.   Eyes:  Negative for blurred vision.  Respiratory:  Negative for cough, shortness of breath and wheezing.   Cardiovascular:  Negative for chest pain, palpitations and leg swelling.  Gastrointestinal:  Negative for abdominal pain, blood in stool, constipation, diarrhea, nausea and vomiting.  Genitourinary:  Negative for dysuria, frequency and hematuria.  Musculoskeletal:  Negative for falls.       (-)new muscle pain (-)new joint pain  Skin:  Negative for rash.        (-)New moles  Neurological:  Negative for dizziness, loss of consciousness and headaches.  Endo/Heme/Allergies:  Negative for environmental allergies.  Psychiatric/Behavioral:  Negative for depression. The patient is not nervous/anxious.        Objective:    Physical Exam Vitals and nursing note reviewed.  Constitutional:      General: She is not in  acute distress.    Appearance: Normal appearance. She is well-developed. She is not ill-appearing.  HENT:     Head: Normocephalic and atraumatic.     Right Ear: Tympanic membrane, ear canal and external ear normal.     Left Ear: Tympanic membrane, ear canal and external ear normal.  Eyes:     Extraocular Movements: Extraocular movements intact.     Conjunctiva/sclera: Conjunctivae normal.     Pupils: Pupils are equal, round, and reactive to light.  Neck:     Thyroid: No thyromegaly.     Vascular: No carotid bruit or JVD.  Cardiovascular:     Rate and Rhythm: Normal rate and regular rhythm.     Heart sounds: Normal heart sounds. No murmur heard.    No gallop.  Pulmonary:     Effort: Pulmonary effort is normal. No respiratory distress.     Breath sounds: Normal breath sounds. No wheezing or rales.  Chest:     Chest wall: No tenderness.  Abdominal:     General: Bowel sounds are normal. There is no distension.     Palpations: Abdomen is soft.     Tenderness: There is no abdominal tenderness. There is no guarding.  Musculoskeletal:     Cervical back: Normal range of motion and neck supple.  Skin:    General: Skin is warm and dry.  Neurological:     Mental Status: She is alert and oriented to person, place, and time.  Psychiatric:        Judgment: Judgment normal.     BP 130/80 (BP Location: Left Arm, Patient Position: Sitting, Cuff Size: Normal)   Pulse 67   Temp 97.8 F (36.6 C) (Oral)   Resp 18   Ht '5\' 5"'$  (1.651 m)   Wt 152 lb 6.4 oz (69.1 kg)   SpO2 98%   BMI 25.36 kg/m  Wt Readings from Last 3 Encounters:   08/04/22 152 lb 6.4 oz (69.1 kg)  03/20/22 151 lb 12.8 oz (68.9 kg)  12/30/21 151 lb (68.5 kg)    Diabetic Foot Exam - Simple   No data filed    Lab Results  Component Value Date   WBC 4.2 07/22/2022   HGB 11.9 (L) 07/22/2022   HCT 35.7 (L) 07/22/2022   PLT 259.0 07/22/2022   GLUCOSE 89 07/22/2022   CHOL 186 07/22/2022   TRIG 108.0 07/22/2022   HDL 64.80 07/22/2022   LDLDIRECT 117.5 01/27/2010   LDLCALC 100 (H) 07/22/2022   ALT 16 07/22/2022   AST 23 07/22/2022   NA 140 07/22/2022   K 4.6 07/22/2022   CL 105 07/22/2022   CREATININE 0.61 07/22/2022   BUN 18 07/22/2022   CO2 28 07/22/2022   TSH 3.89 07/22/2022   MICROALBUR 0.7 03/22/2013    Lab Results  Component Value Date   TSH 3.89 07/22/2022   Lab Results  Component Value Date   WBC 4.2 07/22/2022   HGB 11.9 (L) 07/22/2022   HCT 35.7 (L) 07/22/2022   MCV 95.2 07/22/2022   PLT 259.0 07/22/2022   Lab Results  Component Value Date   NA 140 07/22/2022   K 4.6 07/22/2022   CO2 28 07/22/2022   GLUCOSE 89 07/22/2022   BUN 18 07/22/2022   CREATININE 0.61 07/22/2022   BILITOT 0.6 07/22/2022   ALKPHOS 43 07/22/2022   AST 23 07/22/2022   ALT 16 07/22/2022   PROT 6.7 07/22/2022   ALBUMIN 4.2 07/22/2022   CALCIUM 9.2 07/22/2022  ANIONGAP 10 08/26/2016   GFR 92.78 07/22/2022   Lab Results  Component Value Date   CHOL 186 07/22/2022   Lab Results  Component Value Date   HDL 64.80 07/22/2022   Lab Results  Component Value Date   LDLCALC 100 (H) 07/22/2022   Lab Results  Component Value Date   TRIG 108.0 07/22/2022   Lab Results  Component Value Date   CHOLHDL 3 07/22/2022   No results found for: "HGBA1C"     Assessment & Plan:   Problem List Items Addressed This Visit       Unprioritized   Preventative health care - Primary   Relevant Medications   estradiol (ESTRACE) 2 MG tablet   fenofibrate 54 MG tablet   Other Relevant Orders   Flu Vaccine QUAD High Dose(Fluad)   History of  migraine   Relevant Medications   SUMAtriptan (IMITREX) 50 MG tablet   Essential hypertension   Relevant Medications   atorvastatin (LIPITOR) 40 MG tablet   fenofibrate 54 MG tablet   metoprolol tartrate (LOPRESSOR) 50 MG tablet   Other Visit Diagnoses     Hyperlipidemia, unspecified hyperlipidemia type       Relevant Medications   atorvastatin (LIPITOR) 40 MG tablet   fenofibrate 54 MG tablet   metoprolol tartrate (LOPRESSOR) 50 MG tablet   Menopause       Relevant Medications   estradiol (ESTRACE) 2 MG tablet        Meds ordered this encounter  Medications   atorvastatin (LIPITOR) 40 MG tablet    Sig: Take 1 tablet (40 mg total) by mouth daily.    Dispense:  90 tablet    Refill:  3   estradiol (ESTRACE) 2 MG tablet    Sig: Take 1 tablet (2 mg total) by mouth daily.    Dispense:  90 tablet    Refill:  3   fenofibrate 54 MG tablet    Sig: Take 1 tablet (54 mg total) by mouth daily.    Dispense:  90 tablet    Refill:  3   metoprolol tartrate (LOPRESSOR) 50 MG tablet    Sig: Take 1 tablet (50 mg total) by mouth 2 (two) times daily.    Dispense:  180 tablet    Refill:  3   SUMAtriptan (IMITREX) 50 MG tablet    Sig: TAKE 1 TABLET AS NEEDED FOR MIGRAINE, MAY REPEAT IN 2 HOURS IF HEADACHE PERSISTS OR RECURS    Dispense:  30 tablet    Refill:  0    I, Ann Held, DO, personally preformed the services described in this documentation.  All medical record entries made by the scribe were at my direction and in my presence.  I have reviewed the chart and discharge instructions (if applicable) and agree that the record reflects my personal performance and is accurate and complete. 08/04/2022   I,Shehryar Baig,acting as a scribe for Ann Held, DO.,have documented all relevant documentation on the behalf of Ann Held, DO,as directed by  Ann Held, DO while in the presence of Ann Held, DO.   Ann Held, DO

## 2022-08-04 NOTE — Assessment & Plan Note (Signed)
Encourage heart healthy diet such as MIND or DASH diet, increase exercise, avoid trans fats, simple carbohydrates and processed foods, consider a krill or fish or flaxseed oil cap daily.  °

## 2022-08-05 ENCOUNTER — Encounter: Payer: Self-pay | Admitting: Family Medicine

## 2022-08-05 DIAGNOSIS — I10 Essential (primary) hypertension: Secondary | ICD-10-CM | POA: Diagnosis not present

## 2022-08-05 DIAGNOSIS — Z780 Asymptomatic menopausal state: Secondary | ICD-10-CM | POA: Diagnosis not present

## 2022-08-05 DIAGNOSIS — E785 Hyperlipidemia, unspecified: Secondary | ICD-10-CM | POA: Diagnosis not present

## 2022-08-05 DIAGNOSIS — Z23 Encounter for immunization: Secondary | ICD-10-CM

## 2022-08-05 DIAGNOSIS — Z8669 Personal history of other diseases of the nervous system and sense organs: Secondary | ICD-10-CM | POA: Diagnosis not present

## 2022-08-05 DIAGNOSIS — Z0000 Encounter for general adult medical examination without abnormal findings: Secondary | ICD-10-CM | POA: Diagnosis not present

## 2022-08-10 DIAGNOSIS — L814 Other melanin hyperpigmentation: Secondary | ICD-10-CM | POA: Diagnosis not present

## 2022-08-10 DIAGNOSIS — Z85828 Personal history of other malignant neoplasm of skin: Secondary | ICD-10-CM | POA: Diagnosis not present

## 2022-08-10 DIAGNOSIS — D225 Melanocytic nevi of trunk: Secondary | ICD-10-CM | POA: Diagnosis not present

## 2022-08-10 DIAGNOSIS — L810 Postinflammatory hyperpigmentation: Secondary | ICD-10-CM | POA: Diagnosis not present

## 2022-08-10 DIAGNOSIS — L821 Other seborrheic keratosis: Secondary | ICD-10-CM | POA: Diagnosis not present

## 2022-08-14 ENCOUNTER — Encounter: Payer: Medicare HMO | Admitting: Family Medicine

## 2022-08-17 DIAGNOSIS — S92351A Displaced fracture of fifth metatarsal bone, right foot, initial encounter for closed fracture: Secondary | ICD-10-CM | POA: Diagnosis not present

## 2022-08-25 ENCOUNTER — Ambulatory Visit: Payer: Medicare HMO

## 2022-09-02 ENCOUNTER — Ambulatory Visit (INDEPENDENT_AMBULATORY_CARE_PROVIDER_SITE_OTHER): Payer: Medicare HMO

## 2022-09-02 VITALS — Wt 152.0 lb

## 2022-09-02 DIAGNOSIS — Z0000 Encounter for general adult medical examination without abnormal findings: Secondary | ICD-10-CM | POA: Diagnosis not present

## 2022-09-02 NOTE — Progress Notes (Signed)
I connected with  Janet Knox on 09/02/22 by a audio enabled telemedicine application and verified that I am speaking with the correct person using two identifiers.  Patient Location: Home  Provider Location: Home Office  I discussed the limitations of evaluation and management by telemedicine. The patient expressed understanding and agreed to proceed.   Subjective:   Janet Knox is a 66 y.o. female who presents for Medicare Annual (Subsequent) preventive examination.  Review of Systems     Cardiac Risk Factors include: advanced age (>102mn, >>17women);hypertension;dyslipidemia     Objective:    Today's Vitals   09/02/22 1431  Weight: 152 lb (68.9 kg)   Body mass index is 25.29 kg/m.     09/02/2022    2:38 PM 08/12/2021    2:37 PM 07/16/2020    9:00 AM 03/23/2018   12:53 PM 08/26/2016    6:49 PM  Advanced Directives  Does Patient Have a Medical Advance Directive? Yes Yes Yes Yes No  Type of AParamedicof [place]ew [place] will Healthcare Power of [place]ew [place] will   Copy of [place] Chart? No - copy requested No - copy requested  No - copy requested     Current Medications (verified) Outpatient Encounter Medications as of 09/02/2022  Medication Sig   aspirin 81 MG tablet Take 81 mg by mouth daily.   atorvastatin (LIPITOR) 40 MG tablet Take 1 tablet (40 mg total) by mouth daily.   Cholecalciferol (VITAMIN D3) 5000 UNITS TABS Take 1 tablet by mouth daily.    Coenzyme Q10 (COQ-10) 200 MG CAPS Take 1 capsule by mouth daily.   [employer] SUNSCREEN SPF 30 EX apply   estradiol (ESTRACE) 2 MG tablet Take 1 tablet (2 mg total) by mouth daily.   fenofibrate 54 MG tablet Take 1 tablet (54 mg total) by mouth daily.   Ferrous Sulfate 27 MG TABS Take 1 tablet by mouth daily.   losartan (COZAAR) 100 MG tablet Take 1 tablet (100 mg total) by mouth daily.   metoprolol tartrate (LOPRESSOR) 50 MG tablet Take 1  tablet (50 mg total) by mouth 2 (two) times daily.   Multiple Vitamin (MULTIVITAMIN) tablet Take 1 tablet by mouth daily.   niacin 500 MG tablet Take 2 tablets (1,000 mg total) by mouth daily with breakfast.   Omega-3 Fatty Acids (FISH OIL) 1200 MG CAPS Take 1 capsule by mouth daily.    SUMAtriptan (IMITREX) 50 MG tablet TAKE 1 TABLET AS NEEDED FOR MIGRAINE, MAY REPEAT IN 2 HOURS IF HEADACHE PERSISTS OR RECURS   vitamin E 400 UNIT capsule Take 400 Units by mouth daily.   No facility-administered encounter medications on file as of 09/02/2022.    Allergies (verified) Latex   History: Past Medical History:  Diagnosis Date   Anemia    Hyperlipidemia    Hypertension    Skin cancer    Past Surgical History:  Procedure Laterality Date   ABDOMINAL HYSTERECTOMY     EYE SURGERY Left    Torn Retina   KNEE SURGERY     Family History  Problem Relation Age of Onset   Alzheimer's disease Mother    Hyperlipidemia Mother    Hypertension Mother    Coronary artery disease Paternal Grandfather    Stroke Paternal Grandfather    Hyperlipidemia Father    Hypertension Father    Atrial fibrillation Father    Social History   Socioeconomic History   Marital  status: Married    Spouse name: Not on file   Number of children: Not on file   Years of education: Not on file   Highest education level: Not on file  Occupational History   Occupation: [employer]  Tobacco Use   Smoking status: Never   Smokeless tobacco: Never  Vaping Use   Vaping Use: Never used  Substance and Sexual Activity   Alcohol use: Yes    Alcohol/week: 1.0 standard drink of alcohol    Types: 1 Glasses of wine per week   Drug use: No   Sexual activity: Yes    Partners: Male  Other Topics Concern   Not on file  Social History Narrative   Exercise-- 3 x a week for 1 hour   Social Determinants of Health   Financial Resource Strain: Low Risk  (08/12/2021)   Overall Financial Resource Strain (CARDIA)     Difficulty of Paying Living Expenses: Not hard at all  Food Insecurity: No Food Insecurity (09/02/2022)   Hunger Vital Sign    Worried About Running Out of Food in the Last Year: Never true    [place] in the Last Year: Never true  Transportation Needs: No Transportation Needs (09/02/2022)   PRAPARE - Hydrologist (Medical): No    Lack of Transportation (Non-Medical): No  Physical Activity: Inactive (09/02/2022)   Exercise Vital Sign    Days of Exercise per Week: 0 days    Minutes of Exercise per Session: 0 min  Stress: No Stress Concern Present (09/02/2022)   Green    Feeling of Stress : Only a little  Social Connections: Moderately Integrated (09/02/2022)   Social Connection and Isolation Panel [NHANES]    Frequency of Communication with Friends and Family: More than three times a week    Frequency of Social Gatherings with Friends and Family: More than three times a week    Attends Religious Services: More than 4 times per year    Active Member of [employer] or Organizations: No    Attends Music therapist: Never    Marital Status: Married    Tobacco Counseling Counseling given: Not Answered   Clinical Intake:  Pre-visit preparation completed: Yes  Pain : No/denies pain     BMI - recorded: 25.29 Nutritional Status: BMI 25 -29 Overweight Nutritional Risks: None Diabetes: No  How often do you need to have someone help you when you read instructions, pamphlets, or other written materials from your doctor or pharmacy?: 1 - Never  Diabetic?no  Interpreter Needed?: No  Information entered by :: Charlott Rakes, LPN   Activities of Daily Living    09/02/2022    2:39 PM  In your present state of health, do you have any difficulty performing the following activities:  Hearing? 0  Vision? 0  Difficulty concentrating or making decisions? 0  Walking or climbing  stairs? 0  Dressing or bathing? 0  Doing errands, shopping? 0  Preparing Food and eating ? N  Using the Toilet? N  In the past six months, have you accidently leaked urine? Y  Comment pantyliner with sneezing episodes  Do you have problems with loss of bowel control? N  Managing your Medications? N  Managing your Finances? N  Housekeeping or managing your Housekeeping? N    Patient Care Team: Carollee Herter, Alferd Apa, DO as PCP - General Maisie Fus, MD (Inactive) as  Consulting Physician (Obstetrics and Gynecology) Sherlynn Stalls, MD as Consulting Physician (Ophthalmology) Barbaraann Cao, OD as Referring Physician (Optometry) Darleen Crocker, MD as Consulting Physician (Ophthalmology)  Indicate any recent Medical Services you may have received from other than [facility] providers in the past year (date may be approximate).     Assessment:   This is a routine wellness examination for Janet Knox.  Hearing/Vision screen Hearing Screening - Comments:: Pt denies any hearing issues  Vision Screening - Comments:: Pt follows up with Dr Sandre Kitty for annual eye exams  Dietary issues and exercise activities discussed: Current Exercise Habits: The patient does not participate in regular exercise at present   Goals Addressed             This Visit's Progress    Patient Stated       Lose  about 20 lbs and get back on treadmill       Depression Screen    09/02/2022    2:36 PM 08/12/2021    2:36 PM 07/16/2020   10:44 AM 04/30/2020    3:40 PM 01/13/2018    8:34 AM 06/23/2016    9:24 AM 03/22/2013    9:29 AM  PHQ 2/9 Scores  PHQ - 2 Score 0 0 0 0 0 0 0  PHQ- 9 Score   0        Fall Risk    09/02/2022    2:39 PM 08/12/2021    2:39 PM 07/16/2020   10:44 AM 01/13/2018    8:34 AM  [place] in the past year? 0 1 0 No  Number falls in past yr: 0 1 0   Injury with Fall? 0 1 0   Comment  bruise    Risk for fall due to : Impaired vision     Follow up Falls prevention  discussed Falls prevention discussed Falls evaluation completed     FALL RISK PREVENTION PERTAINING TO THE HOME:  Any stairs in or around the home? Yes  If so, are there any without handrails? No  Home free of loose throw rugs in walkways, pet beds, electrical cords, etc? Yes  Adequate lighting in your home to reduce risk of falls? Yes   ASSISTIVE DEVICES UTILIZED TO PREVENT FALLS:  Life alert? No  Use of a cane, walker or w/c? No  Grab bars in the bathroom? Yes  Shower chair or bench in shower? No  Elevated toilet seat or a handicapped toilet? No   TIMED UP AND GO:  Was the test performed? No .  Cognitive Function:    07/16/2020   10:46 AM  MMSE - Mini Mental State Exam  Orientation to time 5  Orientation to Place 5  Registration 3  Attention/ Calculation 5  Recall 3  Language- name 2 objects 2  Language- repeat 1  Language- follow 3 step command 3  Language- read & follow direction 1  Write a sentence 1  Copy design 1  Total score 30        09/02/2022    2:40 PM 08/12/2021    2:40 PM  6CIT Screen  What Year? 0 points 0 points  What month? 0 points 0 points  What time? 0 points 0 points  Count back from 20 0 points 0 points  Months in reverse 0 points 0 points  Repeat phrase 0 points 0 points  Total Score 0 points 0 points    Immunizations Immunization History  Administered Date(s) Administered  Fluad Quad(high Dose 65+) 06/30/2021, 08/05/2022   Influenza Whole 07/26/2007, 07/20/2008   Influenza,inj,Quad PF,6+ Mos 07/26/2015, 06/23/2016, 07/15/2017, 08/05/2018, 06/21/2019, 07/16/2020   Influenza-Unspecified 07/26/2015, 06/23/2016, 07/15/2017   Moderna Covid-19 Vaccine Bivalent Booster 5yr & up 07/25/2021   Moderna SARS-COV2 Booster Vaccination 08/26/2020   Moderna Sars-Covid-2 Vaccination 12/09/2019, 01/09/2020   Pneumococcal Polysaccharide-23 12/31/2015, 12/27/2020   Td 11/03/2002   Tdap 03/22/2013   Zoster Recombinat (Shingrix) 01/05/2017,  07/01/2017   Zoster, Live 08/13/2015   Zoster, Unspecified 01/05/2017, 07/01/2017    TDAP status: Up to date  Flu Vaccine status: Up to date  Pneumococcal vaccine status: Due, Education has been provided regarding the importance of this vaccine. Advised may receive this vaccine at local pharmacy or Health Dept. Aware to provide a copy of the vaccination record if obtained from local pharmacy or Health Dept. Verbalized acceptance and understanding.  Covid-19 vaccine status: Completed vaccines  Qualifies for Shingles Vaccine? Yes   Zostavax completed Yes   Shingrix Completed?: Yes  Screening Tests Health Maintenance  Topic Date Due   Pneumonia Vaccine 66 Years old (2 - PCV) 12/27/2021   COVID-19 Vaccine (4 - 2023-24 season) 05/29/2022   DEXA SCAN  10/09/2022   MAMMOGRAM  11/27/2022   DTaP/Tdap/Td (3 - Td or Tdap) 03/23/2023   Medicare Annual Wellness (AWV)  09/03/2023   COLONOSCOPY (Pts 45-416yrInsurance coverage will need to be confirmed)  09/24/2025   INFLUENZA VACCINE  Completed   Hepatitis C Screening  Completed   Zoster Vaccines- Shingrix  Completed   HPV VACCINES  Aged Out    Health Maintenance  Health Maintenance Due  Topic Date Due   Pneumonia Vaccine [age]Years old (2 - PCV) 12/27/2021   COVID-19 Vaccine (4 - 2023-24 season) 05/29/2022    Colorectal cancer screening: Type of screening: Colonoscopy. Completed 09/25/15. Repeat every 10 years  Mammogram status: Completed 11/26/21. Repeat every year  Bone Density status: Completed 10/09/20. Results reflect: Bone density results: NORMAL. Repeat every 2 years.  Additional Screening:  Hepatitis C Screening:  Completed 07/26/15  Vision Screening: Recommended annual ophthalmology exams for early detection of glaucoma and other disorders of the eye. Is the patient up to date with their annual eye exam?  Yes  Who is the provider or what is the name of the office in which the patient attends annual eye exams? Dr SuSandre KittyIf pt is not established with a provider, would they like to be referred to a provider to establish care? No .   Dental Screening: Recommended annual dental exams for proper oral hygiene  Community Resource Referral / Chronic Care Management: CRR required this visit?  No   CCM required this visit?  No      Plan:     I have personally reviewed and noted the following in the patient's chart:   Medical and social history Use of alcohol, tobacco or illicit drugs  Current medications and supplements including opioid prescriptions. Patient is not currently taking opioid prescriptions. Functional ability and status Nutritional status Physical activity Advanced directives List of other physicians Hospitalizations, surgeries, and ER visits in previous 12 months Vitals Screenings to include cognitive, depression, and falls Referrals and appointments  In addition, I have reviewed and discussed with patient certain preventive protocols, quality metrics, and best practice recommendations. A written personalized care plan for preventive services as well as general preventive health recommendations were provided to patient.     TiWillette BraceLPN   1251/03/16  Nurse Notes: none

## 2022-09-02 NOTE — Patient Instructions (Signed)
[name] , Thank you for taking time to come for your Medicare Wellness Visit. I appreciate your ongoing commitment to your health goals. Please review the following plan we discussed and let me know if I can assist you in the future.   These are the goals we discussed:  Goals      Patient Stated     Lose weight      Patient Stated     Lose  about 20 lbs and get back on treadmill        This is a list of the screening recommended for you and due dates:  Health Maintenance  Topic Date Due   Pneumonia Vaccine (2 - PCV) 12/27/2021   COVID-19 Vaccine (4 - 2023-24 season) 05/29/2022   DEXA scan (bone density measurement)  10/09/2022   Mammogram  11/27/2022   DTaP/Tdap/Td vaccine (3 - Td or Tdap) 03/23/2023   Medicare Annual Wellness Visit  09/03/2023   Colon Cancer Screening  09/24/2025   Flu Shot  Completed   Hepatitis C Screening: USPSTF Recommendation to screen - Ages 18-79 yo.  Completed   Zoster (Shingles) Vaccine  Completed   HPV Vaccine  Aged Out    Advanced directives: Please bring a copy of your health care power of attorney and living will to the office at your convenience.  Conditions/risks identified: get back on the treadmill and lose about 20 lbs   Next appointment: Follow up in one year for your annual wellness visit    Preventive Care 65 Years and Older, Female Preventive care refers to lifestyle choices and visits with your health care provider that can promote health and wellness. What does preventive care include? A yearly physical exam. This is also called an annual well check. Dental exams once or twice a year. Routine eye exams. Ask your health care provider how often you should have your eyes checked. Personal lifestyle choices, including: Daily care of your teeth and gums. Regular physical activity. Eating a healthy diet. Avoiding tobacco and drug use. Limiting alcohol use. Practicing safe sex. Taking low-dose aspirin every day. Taking vitamin  and mineral supplements as recommended by your health care provider. What happens during an annual well check? The services and screenings done by your health care provider during your annual well check will depend on your age, overall health, lifestyle risk factors, and family history of disease. Counseling  Your health care provider may ask you questions about your: Alcohol use. Tobacco use. Drug use. Emotional well-being. Home and relationship well-being. Sexual activity. Eating habits. History of falls. Memory and ability to understand (cognition). Work and work Statistician. Reproductive health. Screening  You may have the following tests or measurements: Height, weight, and BMI. Blood pressure. Lipid and cholesterol levels. These may be checked every 5 years, or more frequently if you are over 16 years old. Skin check. Lung cancer screening. You may have this screening every year starting at age 36 if you have a 30-pack-year history of smoking and currently smoke or have quit within the past 15 years. Fecal occult blood test (FOBT) of the stool. You may have this test every year starting at age 21. Flexible sigmoidoscopy or colonoscopy. You may have a sigmoidoscopy every 5 years or a colonoscopy every 10 years starting at age 21. Hepatitis C blood test. Hepatitis B blood test. Sexually transmitted disease (STD) testing. Diabetes screening. This is done by checking your blood sugar (glucose) after you have not eaten for a while (fasting). You  may have this done every 1-3 years. Bone density scan. This is done to screen for osteoporosis. You may have this done starting at age 57. Mammogram. This may be done every 1-2 years. Talk to your health care provider about how often you should have regular mammograms. Talk with your health care provider about your test results, treatment options, and if necessary, the need for more tests. Vaccines  Your health care provider may recommend  certain vaccines, such as: Influenza vaccine. This is recommended every year. Tetanus, diphtheria, and acellular pertussis (Tdap, Td) vaccine. You may need a Td booster every 10 years. Zoster vaccine. You may need this after age 45. Pneumococcal 13-valent conjugate (PCV13) vaccine. One dose is recommended after age 29. Pneumococcal polysaccharide (PPSV23) vaccine. One dose is recommended after age 43. Talk to your health care provider about which screenings and vaccines you need and how often you need them. This information is not intended to replace advice given to you by your health care provider. Make sure you discuss any questions you have with your health care provider. Document Released: 10/11/2015 Document Revised: 06/03/2016 Document Reviewed: 07/16/2015 Elsevier Interactive Patient Education  2017 [place] Prevention in the Home Falls can cause injuries. They can happen to people of all ages. There are many things you can do to make your home safe and to help prevent falls. What can I do on the outside of my home? Regularly fix the edges of walkways and driveways and fix any cracks. Remove anything that might make you trip as you walk through a door, such as a raised step or threshold. Trim any bushes or trees on the path to your home. Use bright outdoor lighting. Clear any walking paths of anything that might make someone trip, such as rocks or tools. Regularly check to see if handrails are loose or broken. Make sure that both sides of any steps have handrails. Any raised decks and porches should have guardrails on the edges. Have any leaves, snow, or ice cleared regularly. Use sand or salt on walking paths during winter. Clean up any spills in your garage right away. This includes oil or grease spills. What can I do in the bathroom? Use night lights. Install grab bars by the toilet and in the tub and shower. Do not use towel bars as grab bars. Use non-skid mats or  decals in the tub or shower. If you need to sit down in the shower, use a plastic, non-slip stool. Keep the floor dry. Clean up any water that spills on the floor as soon as it happens. Remove soap buildup in the tub or shower regularly. Attach bath mats securely with double-sided non-slip rug tape. Do not have throw rugs and other things on the floor that can make you trip. What can I do in the bedroom? Use night lights. Make sure that you have a light by your bed that is easy to reach. Do not use any sheets or blankets that are too big for your bed. They should not hang down onto the floor. Have a firm chair that has side arms. You can use this for support while you get dressed. Do not have throw rugs and other things on the floor that can make you trip. What can I do in the kitchen? Clean up any spills right away. Avoid walking on wet floors. Keep items that you use a lot in easy-to-reach places. If you need to reach something above you, use a strong  step stool that has a grab bar. Keep electrical cords out of the way. Do not use floor polish or wax that makes floors slippery. If you must use wax, use non-skid floor wax. Do not have throw rugs and other things on the floor that can make you trip. What can I do with my stairs? Do not leave any items on the stairs. Make sure that there are handrails on both sides of the stairs and use them. Fix handrails that are broken or loose. Make sure that handrails are as long as the stairways. Check any carpeting to make sure that it is firmly attached to the stairs. Fix any carpet that is loose or worn. Avoid having throw rugs at the top or bottom of the stairs. If you do have throw rugs, attach them to the floor with carpet tape. Make sure that you have a light switch at the top of the stairs and the bottom of the stairs. If you do not have them, ask someone to add them for you. What else can I do to help prevent falls? Wear shoes that: Do not  have high heels. Have rubber bottoms. Are comfortable and fit you well. Are closed at the toe. Do not wear sandals. If you use a stepladder: Make sure that it is fully opened. Do not climb a closed stepladder. Make sure that both sides of the stepladder are locked into place. Ask someone to hold it for you, if possible. Clearly mark and make sure that you can see: Any grab bars or handrails. First and last steps. Where the edge of each step is. Use tools that help you move around (mobility aids) if they are needed. These include: Canes. Walkers. Scooters. Crutches. Turn on the lights when you go into a dark area. Replace any light bulbs as soon as they burn out. Set up your furniture so you have a clear path. Avoid moving your furniture around. If any of your floors are uneven, fix them. If there are any pets around you, be aware of where they are. Review your medicines with your doctor. Some medicines can make you feel dizzy. This can increase your chance of falling. Ask your doctor what other things that you can do to help prevent falls. This information is not intended to replace advice given to you by your health care provider. Make sure you discuss any questions you have with your health care provider. Document Released: 07/11/2009 Document Revised: 02/20/2016 Document Reviewed: 10/19/2014 Elsevier Interactive Patient Education  2017 [employer].

## 2022-10-13 ENCOUNTER — Ambulatory Visit (INDEPENDENT_AMBULATORY_CARE_PROVIDER_SITE_OTHER): Payer: Medicare HMO | Admitting: Family Medicine

## 2022-10-13 ENCOUNTER — Encounter: Payer: Self-pay | Admitting: Family Medicine

## 2022-10-13 VITALS — BP 148/80 | HR 78 | Temp 98.4°F | Resp 12 | Ht 65.0 in | Wt 153.0 lb

## 2022-10-13 DIAGNOSIS — R079 Chest pain, unspecified: Secondary | ICD-10-CM | POA: Diagnosis not present

## 2022-10-13 DIAGNOSIS — I10 Essential (primary) hypertension: Secondary | ICD-10-CM | POA: Diagnosis not present

## 2022-10-13 DIAGNOSIS — R002 Palpitations: Secondary | ICD-10-CM | POA: Diagnosis not present

## 2022-10-13 MED ORDER — METOPROLOL TARTRATE 100 MG PO TABS: 100.0000 mg | ORAL_TABLET | Freq: Two times a day (BID) | ORAL | 3 refills | Status: DC

## 2022-10-13 NOTE — Telephone Encounter (Signed)
Patient scheduled for today @ 6

## 2022-10-13 NOTE — Assessment & Plan Note (Signed)
Pt requesting to see cardiology on [address]

## 2022-10-13 NOTE — Progress Notes (Addendum)
Subjective:   By signing my name below, I, Daiva Huge, attest that this documentation has been prepared under the direction and in the presence of Ann Held, DO 10/13/22   Patient ID: Janet Knox, female    DOB: [birth year], 67 y.o.   MRN: [id number]  Chief Complaint  Patient presents with   Hypertension    High at dentist and high at home when rechecked     HPI Patient is in today for an office visit.  She has been having high blood pressure readings recently. She needs to have a tooth extraction and implant placed and noticed the high BP during an appointment with her oral surgeon. She feels normal now but reports feeling jittery intermittently. She has some chest pain.  She is taking Losartan, 100 mg BID, and Metoprolol, 50 mg BID.  Her blood pressure has been running high. She reports a blood pressure of 200/90 at her recent appointment with her oral surgeon. BP Readings from Last 3 Encounters:  10/13/22 (!) 148/80  08/04/22 130/80  03/20/22 (!) 148/56   She has a follow-up with her orthopedist this week.   Past Medical History:  Diagnosis Date   Anemia    Hyperlipidemia    Hypertension    Skin cancer     Past Surgical History:  Procedure Laterality Date   ABDOMINAL HYSTERECTOMY     EYE SURGERY Left    Torn Retina   KNEE SURGERY      Family History  Problem Relation Age of Onset   Alzheimer's disease Mother    Hyperlipidemia Mother    Hypertension Mother    Coronary artery disease Paternal Grandfather    Stroke Paternal Grandfather    Hyperlipidemia Father    Hypertension Father    Atrial fibrillation Father     Social History   Socioeconomic History   Marital status: Married    Spouse name: Not on file   Number of children: Not on file   Years of education: Not on file   Highest education level: Not on file  Occupational History   Occupation: [employer]  Tobacco Use   Smoking status: Never   Smokeless tobacco: Never   Vaping Use   Vaping Use: Never used  Substance and Sexual Activity   Alcohol use: Yes    Alcohol/week: 1.0 standard drink of alcohol    Types: 1 Glasses of wine per week   Drug use: No   Sexual activity: Yes    Partners: Male  Other Topics Concern   Not on file  Social History Narrative   Exercise-- 3 x a week for 1 hour   Social Determinants of Health   Financial Resource Strain: Low Risk  (08/12/2021)   Overall Financial Resource Strain (CARDIA)    Difficulty of Paying Living Expenses: Not hard at all  Food Insecurity: No Food Insecurity (09/02/2022)   Hunger Vital Sign    Worried About Running Out of Food in the Last Year: Never true    [place] in the Last Year: Never true  Transportation Needs: No Transportation Needs (09/02/2022)   PRAPARE - Hydrologist (Medical): No    Lack of Transportation (Non-Medical): No  Physical Activity: Inactive (09/02/2022)   Exercise Vital Sign    Days of Exercise per Week: 0 days    Minutes of Exercise per Session: 0 min  Stress: No Stress Concern Present (09/02/2022)   [employer]ational  [employer] - Occupational Stress Questionnaire    Feeling of Stress : Only a little  Social Connections: Moderately Integrated (09/02/2022)   Social Connection and Isolation Panel [NHANES]    Frequency of Communication with Friends and Family: More than three times a week    Frequency of Social Gatherings with Friends and Family: More than three times a week    Attends Religious Services: More than 4 times per year    Active Member of [employer] or Organizations: No    Attends Archivist Meetings: Never    Marital Status: Married  Human resources officer Violence: Not At Risk (09/02/2022)   Humiliation, Afraid, Rape, and Kick questionnaire    Fear of Current or Ex-Partner: No    Emotionally Abused: No    Physically Abused: No    Sexually Abused: No    Outpatient Medications Prior to Visit  Medication Sig  Dispense Refill   aspirin 81 MG tablet Take 81 mg by mouth daily.     atorvastatin (LIPITOR) 40 MG tablet Take 1 tablet (40 mg total) by mouth daily. 90 tablet 3   Cholecalciferol (VITAMIN D3) 5000 UNITS TABS Take 1 tablet by mouth daily.      Coenzyme Q10 (COQ-10) 200 MG CAPS Take 1 capsule by mouth daily.     CVS SUNSCREEN SPF 30 EX apply     estradiol (ESTRACE) 2 MG tablet Take 1 tablet (2 mg total) by mouth daily. 90 tablet 3   fenofibrate 54 MG tablet Take 1 tablet (54 mg total) by mouth daily. 90 tablet 3   Ferrous Sulfate 27 MG TABS Take 1 tablet by mouth daily.     losartan (COZAAR) 100 MG tablet Take 1 tablet (100 mg total) by mouth daily. 90 tablet 1   Multiple Vitamin (MULTIVITAMIN) tablet Take 1 tablet by mouth daily.     niacin 500 MG tablet Take 2 tablets (1,000 mg total) by mouth daily with breakfast. 180 tablet 3   Omega-3 Fatty Acids (FISH OIL) 1200 MG CAPS Take 1 capsule by mouth daily.      SUMAtriptan (IMITREX) 50 MG tablet TAKE 1 TABLET AS NEEDED FOR MIGRAINE, MAY REPEAT IN 2 HOURS IF HEADACHE PERSISTS OR RECURS 30 tablet 0   vitamin E 400 UNIT capsule Take 400 Units by mouth daily.     metoprolol tartrate (LOPRESSOR) 50 MG tablet Take 1 tablet (50 mg total) by mouth 2 (two) times daily. 180 tablet 3   No facility-administered medications prior to visit.    Allergies  Allergen Reactions   Latex Rash    Review of Systems  Constitutional:  Negative for fever and malaise/fatigue.  HENT:  Negative for congestion.   Eyes:  Negative for blurred vision.  Respiratory:  Negative for cough and shortness of breath.   Cardiovascular:  Positive for chest pain. Negative for palpitations and leg swelling.  Gastrointestinal:  Negative for abdominal pain, blood in stool, nausea and vomiting.  Genitourinary:  Negative for dysuria and frequency.  Musculoskeletal:  Negative for back pain and falls.  Skin:  Negative for rash.  Neurological:  Negative for dizziness, loss of  consciousness and headaches.  Endo/Heme/Allergies:  Negative for environmental allergies.  Psychiatric/Behavioral:  Negative for depression. The patient is not nervous/anxious.        Objective:    Physical Exam Vitals and nursing note reviewed.  Constitutional:      General: She is not in acute distress.    Appearance: Normal appearance. She is not ill-appearing.  HENT:     Head: Normocephalic and atraumatic.     Right Ear: External ear normal.     Left Ear: External ear normal.  Eyes:     Extraocular Movements: Extraocular movements intact.     Pupils: Pupils are equal, round, and reactive to light.  Cardiovascular:     Rate and Rhythm: Normal rate and regular rhythm.     Heart sounds: Normal heart sounds. No murmur heard.    No gallop.  Pulmonary:     Effort: Pulmonary effort is normal. No respiratory distress.     Breath sounds: Normal breath sounds. No wheezing or rales.  Skin:    General: Skin is warm and dry.  Neurological:     Mental Status: She is alert and oriented to person, place, and time.  Psychiatric:        Judgment: Judgment normal.     BP (!) 148/80 (BP Location: Right Arm, Cuff Size: Normal)   Pulse 78   Temp 98.4 F (36.9 C) (Oral)   Resp 12   Ht '5\' 5"'$  (1.651 m)   Wt 153 lb (69.4 kg)   SpO2 98%   BMI 25.46 kg/m  Wt Readings from Last 3 Encounters:  10/13/22 153 lb (69.4 kg)  09/02/22 152 lb (68.9 kg)  08/04/22 152 lb 6.4 oz (69.1 kg)   EKG---   Neg precordial t waves , sinus rhythm Palpitations Assessment & Plan: Event monitor  Referral to cardiology  Orders: -     EKG 12-Lead -     CARDIAC EVENT MONITOR; Future  Essential hypertension Assessment & Plan: Poorly controlled will alter medications, encouraged DASH diet, minimize caffeine and obtain adequate sleep. Report concerning symptoms and follow up as directed and as needed   Orders: -     Metoprolol Tartrate; Take 1 tablet (100 mg total) by mouth 2 (two) times daily.   Dispense: 180 tablet; Refill: 3  Chest pain, unspecified type Assessment & Plan: Pt requesting to see cardiology on [address]   Orders: -     Ambulatory referral to Cardiology -     [place]; Future     I,Alexander Ruley,acting as a scribe for [employer], DO.,have documented all relevant documentation on the behalf of Ann Held, DO,as directed by  Ann Held, DO while in the presence of [place], DO, personally preformed the services described in this documentation.  All medical record entries made by the scribe were at my direction and in my presence.  I have reviewed the chart and discharge instructions (if applicable) and agree that the record reflects my personal performance and is accurate and complete. 10/13/22    Ann Held, DO

## 2022-10-13 NOTE — Assessment & Plan Note (Signed)
Event monitor  Referral to cardiology

## 2022-10-13 NOTE — Assessment & Plan Note (Signed)
Poorly controlled will alter medications, encouraged DASH diet, minimize caffeine and obtain adequate sleep. Report concerning symptoms and follow up as directed and as needed 

## 2022-10-14 DIAGNOSIS — S92351A Displaced fracture of fifth metatarsal bone, right foot, initial encounter for closed fracture: Secondary | ICD-10-CM | POA: Diagnosis not present

## 2022-10-14 DIAGNOSIS — M75101 Unspecified rotator cuff tear or rupture of right shoulder, not specified as traumatic: Secondary | ICD-10-CM | POA: Diagnosis not present

## 2022-10-21 ENCOUNTER — Encounter: Payer: Self-pay | Admitting: Family Medicine

## 2022-10-26 ENCOUNTER — Ambulatory Visit: Payer: Medicare HMO | Attending: Cardiology | Admitting: Cardiology

## 2022-10-26 ENCOUNTER — Encounter: Payer: Self-pay | Admitting: *Deleted

## 2022-10-26 ENCOUNTER — Encounter: Payer: Self-pay | Admitting: Cardiology

## 2022-10-26 ENCOUNTER — Encounter: Payer: Self-pay | Admitting: Family Medicine

## 2022-10-26 VITALS — BP 116/72 | HR 64 | Ht 65.0 in | Wt 150.6 lb

## 2022-10-26 DIAGNOSIS — I6523 Occlusion and stenosis of bilateral carotid arteries: Secondary | ICD-10-CM

## 2022-10-26 DIAGNOSIS — R002 Palpitations: Secondary | ICD-10-CM | POA: Diagnosis not present

## 2022-10-26 DIAGNOSIS — R079 Chest pain, unspecified: Secondary | ICD-10-CM

## 2022-10-26 DIAGNOSIS — E785 Hyperlipidemia, unspecified: Secondary | ICD-10-CM

## 2022-10-26 DIAGNOSIS — I10 Essential (primary) hypertension: Secondary | ICD-10-CM | POA: Diagnosis not present

## 2022-10-26 NOTE — Progress Notes (Unsigned)
Primary Care Provider: Carollee Herter, Alferd Apa, DO [place] Cardiologist: None Electrophysiologist: None  Clinic Note: No chief complaint on file.  ===================================  ASSESSMENT/PLAN   Problem List Items Addressed This Visit       Cardiology Problems   Hyperlipidemia LDL goal <70 (Chronic)   Essential hypertension - Primary (Chronic)     Other   Palpitations (Chronic)    ===================================  HPI:    Janet Knox is a 67 y.o. female with PMH notable for HTN, HLD and Carotid Artery Disease who is being seen today for the evaluation of Palpitations and Chest Pain at the request of Ann Held, *.  Janet Knox was last seen on October 13, 2022 by Dr. Carollee Herter complaints of high blood pressure while at her oral surgeon's office while having tooth removed..  She felt quite jittery during this period and had some chest pain.  Noted blood pressures running as high as 200/90 at the surgeon's office. = > 30-day monitor ordered for cardiology.  Unfortunately, the monitor is no longer being worn because of concerns with adhesive intolerance.  Recent Hospitalizations: ***  Reviewed  CV studies:    The following studies were reviewed today: (if available, images/films reviewed: From Epic Chart or Care Everywhere) ***:   Interval History:   Janet Knox   CV Review of Symptoms (Summary):  {roscv:310661}  REVIEWED OF SYSTEMS   ROS  I have reviewed and (if needed) personally updated the patient's problem list, medications, allergies, past medical and surgical history, social and family history.   PAST MEDICAL HISTORY   Past Medical History:  Diagnosis Date   Anemia    Hyperlipidemia    Hypertension    Skin cancer     PAST SURGICAL HISTORY   Past Surgical History:  Procedure Laterality Date   ABDOMINAL HYSTERECTOMY     EYE SURGERY Left    Torn Retina   KNEE SURGERY      Immunization History   Administered Date(s) Administered   Fluad Quad(high Dose 65+) 06/30/2021, 08/05/2022   Influenza Whole 07/26/2007, 07/20/2008   Influenza,inj,Quad PF,6+ Mos 07/26/2015, 06/23/2016, 07/15/2017, 08/05/2018, 06/21/2019, 07/16/2020   Influenza-Unspecified 07/26/2015, 06/23/2016, 07/15/2017   Moderna Covid-19 Vaccine Bivalent Booster 74yr & up 07/25/2021   Moderna SARS-COV2 Booster Vaccination 08/26/2020   Moderna Sars-Covid-2 Vaccination 12/09/2019, 01/09/2020   Pneumococcal Polysaccharide-23 12/31/2015, 12/27/2020   Td 11/03/2002   Tdap 03/22/2013   Zoster Recombinat (Shingrix) 01/05/2017, 07/01/2017   Zoster, Live 08/13/2015   Zoster, Unspecified 01/05/2017, 07/01/2017    MEDICATIONS/ALLERGIES   Current Meds  Medication Sig   aspirin 81 MG tablet Take 81 mg by mouth daily.   atorvastatin (LIPITOR) 40 MG tablet Take 1 tablet (40 mg total) by mouth daily.   Cholecalciferol (VITAMIN D3) 5000 UNITS TABS Take 1 tablet by mouth daily.    Coenzyme Q10 (COQ-10) 200 MG CAPS Take 1 capsule by mouth daily.   CVS SUNSCREEN SPF 30 EX apply   estradiol (ESTRACE) 2 MG tablet Take 1 tablet (2 mg total) by mouth daily.   fenofibrate 54 MG tablet Take 1 tablet (54 mg total) by mouth daily.   Ferrous Sulfate 27 MG TABS Take 1 tablet by mouth daily.   losartan (COZAAR) 100 MG tablet Take 1 tablet (100 mg total) by mouth daily.   metoprolol tartrate (LOPRESSOR) 100 MG tablet Take 1 tablet (100 mg total) by mouth 2 (two) times daily.   Multiple Vitamin (MULTIVITAMIN) tablet Take  1 tablet by mouth daily.   niacin 500 MG tablet Take 2 tablets (1,000 mg total) by mouth daily with breakfast.   Omega-3 Fatty Acids (FISH OIL) 1200 MG CAPS Take 1 capsule by mouth daily.    SUMAtriptan (IMITREX) 50 MG tablet TAKE 1 TABLET AS NEEDED FOR MIGRAINE, MAY REPEAT IN 2 HOURS IF HEADACHE PERSISTS OR RECURS   vitamin E 400 UNIT capsule Take 400 Units by mouth daily.    Allergies  Allergen Reactions   Latex Rash     SOCIAL HISTORY/FAMILY HISTORY   Reviewed in Epic:   Social History   Tobacco Use   Smoking status: Never   Smokeless tobacco: Never  Vaping Use   Vaping Use: Never used  Substance Use Topics   Alcohol use: Yes    Alcohol/week: 1.0 standard drink of alcohol    Types: 1 Glasses of wine per week   Drug use: No   Social History   Social History Narrative   Exercise-- 3 x a week for 1 hour   Family History  Problem Relation Age of Onset   Alzheimer's disease Mother    Hyperlipidemia Mother    Hypertension Mother    Coronary artery disease Paternal Grandfather    Stroke Paternal Grandfather    Hyperlipidemia Father    Hypertension Father    Atrial fibrillation Father     OBJCTIVE -PE, EKG, labs   Wt Readings from Last 3 Encounters:  10/26/22 150 lb 9.6 oz (68.3 kg)  10/13/22 153 lb (69.4 kg)  09/02/22 152 lb (68.9 kg)    Physical Exam: BP 116/72   Pulse 64   Ht '5\' 5"'$  (1.651 m)   Wt 150 lb 9.6 oz (68.3 kg)   SpO2 100%   BMI 25.06 kg/m  Physical Exam   Adult ECG Report  Rate: *** ;  Rhythm: {rhythm:17366};   Narrative Interpretation: ***  Recent Labs:  ***  Lab Results  Component Value Date   CHOL 186 07/22/2022   HDL 64.80 07/22/2022   LDLCALC 100 (H) 07/22/2022   LDLDIRECT 117.5 01/27/2010   TRIG 108.0 07/22/2022   CHOLHDL 3 07/22/2022   Lab Results  Component Value Date   CREATININE 0.61 07/22/2022   BUN 18 07/22/2022   NA 140 07/22/2022   K 4.6 07/22/2022   CL 105 07/22/2022   CO2 28 07/22/2022      Latest Ref Rng & Units 07/22/2022    7:55 AM 03/20/2022    8:39 AM 12/30/2021    9:16 AM  CBC  WBC 4.0 - 10.5 K/uL 4.2  4.7  4.0   Hemoglobin 12.0 - 15.0 g/dL 11.9  12.4  12.1   Hematocrit 36.0 - 46.0 % 35.7  37.7  36.1   Platelets 150.0 - 400.0 K/uL 259.0  262.0  256.0     No results found for: "HGBA1C" Lab Results  Component Value Date   TSH 3.89 07/22/2022    ================================================== I spent a total  of *** minutes with the patient spent in direct patient consultation.  Additional time spent with chart review  / charting (studies, outside notes, etc): *** min Total Time: *** min  Current medicines are reviewed at length with the patient today.  (+/- concerns) ***  Notice: This dictation was prepared with Dragon dictation along with smart phrase technology. Any transcriptional errors that result from this process are unintentional and may not be corrected upon review.   Studies Ordered:  No orders of the defined types  were placed in this encounter.  No orders of the defined types were placed in this encounter.   Patient Instructions / Medication Changes & Studies & Tests Ordered   There are no Patient Instructions on file for this visit.    Leonie Man, MD, MS Glenetta Hew, M.D., M.S. Interventional Cardiologist  [facility]  Pager # [phone] Phone # [phone] [address] [place], [place] [address]   Thank you for choosing [facility]!!

## 2022-10-26 NOTE — Progress Notes (Signed)
Patient informed nurse she has an adhesive allergy and wanted to know alternatives for cardiac event monitor. 4 [employer] Hydrocollid strips left at [facility] office front desk for patient to try. [employer] was also contacted to ship patient a bridge and 40mred dot repositionable electrodes, 2660-5 x 8 packages.

## 2022-10-26 NOTE — Patient Instructions (Signed)
Medication Instructions:  No changes   *If you need a refill on your cardiac medications before your next appointment, please call your pharmacy*   Lab Work: Not needed     Testing/Procedures:  You may return 30 day monitor   CT coronary calcium score.   Test locations:  [place]   This is $99 out of pocket.   Coronary CalciumScan A coronary calcium scan is an imaging test used to look for deposits of calcium and other fatty materials (plaques) in the inner lining of the blood vessels of the heart (coronary arteries). These deposits of calcium and plaques can partly clog and narrow the coronary arteries without producing any symptoms or warning signs. This puts a person at risk for a heart attack. This test can detect these deposits before symptoms develop. Tell a health care provider about: Any allergies you have. All medicines you are taking, including vitamins, herbs, eye drops, creams, and over-the-counter medicines. Any problems you or family members have had with anesthetic medicines. Any blood disorders you have. Any surgeries you have had. Any medical conditions you have. Whether you are pregnant or may be pregnant. What are the risks? Generally, this is a safe procedure. However, problems may occur, including: Harm to a pregnant woman and her unborn baby. This test involves the use of radiation. Radiation exposure can be dangerous to a pregnant woman and her unborn baby. If you are pregnant, you generally should not have this procedure done. Slight increase in the risk of cancer. This is because of the radiation involved in the test. What happens before the procedure? No preparation is needed for this procedure. What happens during the procedure? You will undress and remove any jewelry around your neck or chest. You will put on a hospital gown. Sticky electrodes will be placed on your chest. The electrodes will be connected to an  electrocardiogram (ECG) machine to record a tracing of the electrical activity of your heart. A CT scanner will take pictures of your heart. During this time, you will be asked to lie still and hold your breath for 2-3 seconds while a picture of your heart is being taken. The procedure may vary among health care providers and hospitals. What happens after the procedure? You can get dressed. You can return to your normal activities. It is up to you to get the results of your test. Ask your health care provider, or the department that is doing the test, when your results will be ready. Summary A coronary calcium scan is an imaging test used to look for deposits of calcium and other fatty materials (plaques) in the inner lining of the blood vessels of the heart (coronary arteries). Generally, this is a safe procedure. Tell your health care provider if you are pregnant or may be pregnant. No preparation is needed for this procedure. A CT scanner will take pictures of your heart. You can return to your normal activities after the scan is done. This information is not intended to replace advice given to you by your health care provider. Make sure you discuss any questions you have with your health care provider. Document Released: 03/12/2008 Document Revised: 08/03/2016 Document Reviewed: 08/03/2016 Elsevier Interactive Patient Education  2017 [place]: At [facility], you and your health needs are our priority.  As part of our continuing mission to provide you with exceptional heart care, we have created designated Provider Care Teams.  These Care Teams include your  primary Cardiologist (physician) and Advanced Practice Providers (APPs -  Physician Assistants and Nurse Practitioners) who all work together to provide you with the care you need, when you need it.     Your next appointment:   2 month(s)  The format for your next appointment:   Virtual Visit   Provider:    Glenetta Hew, MD    Other Instructions   KardiaMobile [email or web address]        FDA-cleared, clinical grade mobile EKG monitor: Jodelle Red is the most clinically-validated mobile EKG used by the world's leading cardiac care medical professionals With Basic service, know instantly if your heart rhythm is normal or if atrial fibrillation is detected, and email the last single EKG recording to yourself or your doctor Premium service, available for purchase through the Kardia app for $9.99 per month or $99 per year, includes unlimited history and storage of your EKG recordings, a monthly EKG summary report to share with your doctor, along with the ability to track your blood pressure, activity and weight Includes one KardiaMobile phone clip FREE SHIPPING: Standard delivery 1-3 business days. Orders placed by 11:00am PST will ship that afternoon. Otherwise, will ship next business day. All orders ship via [employer] from [place], [place] - sending an EKG Download app and set up profile. Run EKG - by placing 1-2 fingers on the silver plates After EKG is complete - Download PDF  - Skip password (if you apply a password the provider will need it to view the EKG) Click share button (square with upward arrow) in bottom left corner To send: choose MyChart (first time log into MyChart)  Pop up window about sending ECG Click continue Choose type of message Choose provider Type subject and message Click send (EKG should be attached)  - To send additional EKGs in one message click the paperclip image and bottom of page to attach.

## 2022-10-27 ENCOUNTER — Encounter: Payer: Self-pay | Admitting: Cardiology

## 2022-10-27 NOTE — Assessment & Plan Note (Signed)
When I talked to her about chest pain she had that 1 time with oral surgery but she felt some unusual sensation in her chest.  Otherwise she is able to do 40 minutes on the treadmill.  Very unlikely with any chest pain is anything besides musculoskeletal.  We talked about options for further evaluation.  I do not think a stress test is warranted based on 1 atypical symptoms.  She agrees that if she were to have symptoms, then we could proceed with stress evaluation and probably consider coronary CTA. However she would like to develop her baseline risk assessment and, therefore I suggested using a coronary calcium score.  Plan: Check Coronary Calcium Score

## 2022-10-27 NOTE — Assessment & Plan Note (Signed)
BP is pretty well-controlled on current dose of losartan and blood pressure.  Pretty much a max dose of both.  Next option would probably be a diuretic.

## 2022-10-27 NOTE — Assessment & Plan Note (Addendum)
With evidence carotid disease, would try to target LDL less than 70.    For better risk stratification we will check Coronary Calcium Score

## 2022-10-27 NOTE — Assessment & Plan Note (Signed)
Previously documented carotid disease.  I do not hear any bruits.  If there is indeed vascular disease, warrants management of blood pressure and cholesterol as well as sugars. Is on atorvastatin and aspirin.  Also on fenofibrate.  On beta-blocker and ARB.

## 2022-10-27 NOTE — Assessment & Plan Note (Addendum)
On my exam and discussion with her, she is not really palpitations.  She is very leery of using because of concerns with the reaction to adhesive tape.  I recommended she has an Visual merchandiser or a smart watch, and the early conservative check for me is, but also could consider Kardia-Mobile.  She would prefer to forego wearing the monitor and also does not want to try Zio patch.  If her palpitations get worse she will consider Kardia-Mobile.

## 2022-10-28 ENCOUNTER — Ambulatory Visit (HOSPITAL_BASED_OUTPATIENT_CLINIC_OR_DEPARTMENT_OTHER)
Admission: RE | Admit: 2022-10-28 | Discharge: 2022-10-28 | Disposition: A | Discharge status: Home / Self Care | Payer: Medicare HMO | Source: Ambulatory Visit | Attending: Cardiology | Admitting: Cardiology

## 2022-10-28 DIAGNOSIS — R002 Palpitations: Secondary | ICD-10-CM

## 2022-10-28 DIAGNOSIS — I10 Essential (primary) hypertension: Secondary | ICD-10-CM

## 2022-11-04 ENCOUNTER — Encounter: Payer: Self-pay | Admitting: Family Medicine

## 2022-11-13 ENCOUNTER — Other Ambulatory Visit: Payer: Self-pay | Admitting: *Deleted

## 2022-11-13 DIAGNOSIS — I773 Arterial fibromuscular dysplasia: Secondary | ICD-10-CM

## 2022-11-30 NOTE — Progress Notes (Unsigned)
[facility] OF [place]  ASSESSMENT / PLAN: 67 y.o. 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 She should continue ASA '81mg'$  PO QD for this indefinitely. Follow up with me for repeat carotid duplex. Will check renal duplex to exclude FMD / aneurysm of renal vessels. 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 Will refer her to Dr. NKathyrn Sheriff  CHIEF COMPLAINT: FMD of carotid arteries  HISTORY OF PRESENT ILLNESS: Janet Knox a 67y.o. female referred to clinic for evaluation of fibromuscular dysplasia of bilateral carotid arteries.  The patient was evaluated by her primary care physician and a carotid artery duplex performed.  This revealed evidence of fibromuscular dysplasia.  A CT angiogram was then performed.  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  The patient is asymptomatic from a neurologic standpoint.  She specifically denies unilateral weakness, numbness, difficulty speaking, difficulty swallowing, facial droop.  Spent the majority of the visit reviewing the natural history of fibromuscular dysplasia and the rationale for surveillance and treatment.  VASCULAR SURGICAL HISTORY: none  VASCULAR RISK FACTORS: Negative history of stroke / transient ischemic attack. Negative history of coronary artery disease.  Negative history of diabetes mellitus.  Negative history of smoking.  Positive history of hypertension.  Negative history of chronic kidney disease.  Negative history of chronic obstructive pulmonary disease.  FUNCTIONAL STATUS: ECOG performance status: (0) Fully active, able to carry on all predisease performance without restriction Ambulatory status: Ambulatory within the community without limits  Past Medical History:  Diagnosis Date   Anemia     Hyperlipidemia    Hypertension    Skin cancer     Past Surgical History:  Procedure Laterality Date   ABDOMINAL HYSTERECTOMY     EYE SURGERY Left    Torn Retina   KNEE SURGERY      Family History  Problem Relation Age of Onset   Alzheimer's disease Mother    Hyperlipidemia Mother    Hypertension Mother    Coronary artery disease Paternal Grandfather    Stroke Paternal Grandfather    Hyperlipidemia Father    Hypertension Father    Atrial fibrillation Father     Social History   Socioeconomic History   Marital status: Married    Spouse name: Not on file   Number of children: Not on file   Years of education: Not on file   Highest education level: Not on file  Occupational History   Occupation: [employer]  Tobacco Use   Smoking status: Never   Smokeless tobacco: Never  Vaping Use   Vaping Use: Never used  Substance and Sexual Activity   Alcohol use: Yes    Alcohol/week: 1.0 standard drink of alcohol    Types: 1 Glasses of wine per week   Drug use: No   Sexual activity: Yes    Partners: Male  Other Topics Concern   Not on file  Social History Narrative   Exercise-- 3 x a week for 1 hour   Social Determinants of Health   Financial Resource Strain: Low Risk  (08/12/2021)   Overall Financial Resource Strain (CARDIA)    Difficulty of Paying Living Expenses: Not hard at all  Food Insecurity: No Food Insecurity (09/02/2022)   Hunger Vital Sign    Worried About Running Out of Food in the Last Year: Never true  Ran Out of Food in the Last Year: Never true  Transportation Needs: No Transportation Needs (09/02/2022)   PRAPARE - Hydrologist (Medical): No    Lack of Transportation (Non-Medical): No  Physical Activity: Inactive (09/02/2022)   Exercise Vital Sign    Days of Exercise per Week: 0 days    Minutes of Exercise per Session: 0 min  Stress: No Stress Concern Present (09/02/2022)   [place]    Feeling of Stress : Only a little  Social Connections: Moderately Integrated (09/02/2022)   Social Connection and Isolation Panel [NHANES]    Frequency of Communication with Friends and Family: More than three times a week    Frequency of Social Gatherings with Friends and Family: More than three times a week    Attends Religious Services: More than 4 times per year    Active Member of [employer] or Organizations: No    Attends Archivist Meetings: Never    Marital Status: Married  Human resources officer Violence: Not At Risk (09/02/2022)   Humiliation, Afraid, Rape, and Kick questionnaire    Fear of Current or Ex-Partner: No    Emotionally Abused: No    Physically Abused: No    Sexually Abused: No    Allergies  Allergen Reactions   Latex Rash    Current Outpatient Medications  Medication Sig Dispense Refill   aspirin 81 MG tablet Take 81 mg by mouth daily.     atorvastatin (LIPITOR) 40 MG tablet Take 1 tablet (40 mg total) by mouth daily. 90 tablet 3   Cholecalciferol (VITAMIN D3) 5000 UNITS TABS Take 1 tablet by mouth daily.      Coenzyme Q10 (COQ-10) 200 MG CAPS Take 1 capsule by mouth daily.     [employer] SUNSCREEN SPF 30 EX apply     estradiol (ESTRACE) 2 MG tablet Take 1 tablet (2 mg total) by mouth daily. 90 tablet 3   fenofibrate 54 MG tablet Take 1 tablet (54 mg total) by mouth daily. 90 tablet 3   Ferrous Sulfate 27 MG TABS Take 1 tablet by mouth daily.     losartan (COZAAR) 100 MG tablet Take 1 tablet (100 mg total) by mouth daily. 90 tablet 1   metoprolol tartrate (LOPRESSOR) 100 MG tablet Take 1 tablet (100 mg total) by mouth 2 (two) times daily. 180 tablet 3   Multiple Vitamin (MULTIVITAMIN) tablet Take 1 tablet by mouth daily.     niacin 500 MG tablet Take 2 tablets (1,000 mg total) by mouth daily with breakfast. 180 tablet 3   Omega-3 Fatty Acids (FISH OIL) 1200 MG CAPS Take 1 capsule by mouth daily.      SUMAtriptan (IMITREX) 50 MG  tablet TAKE 1 TABLET AS NEEDED FOR MIGRAINE, MAY REPEAT IN 2 HOURS IF HEADACHE PERSISTS OR RECURS 30 tablet 0   vitamin E 400 UNIT capsule Take 400 Units by mouth daily.     No current facility-administered medications for this visit.    REVIEW OF SYSTEMS:  '[X]'$  denotes positive finding, '[ ]'$  denotes negative finding Cardiac  Comments:  Chest pain or chest pressure:    Shortness of breath upon exertion:    Short of breath when lying flat:    Irregular heart rhythm:        Vascular    Pain in calf, thigh, or hip brought on by ambulation:    Pain in feet at night that wakes  you up from your sleep:     Blood clot in your veins:    Leg swelling:         Pulmonary    Oxygen at home:    Productive cough:     Wheezing:         Neurologic    Sudden weakness in arms or legs:     Sudden numbness in arms or legs:     Sudden onset of difficulty speaking or slurred speech:    Temporary loss of vision in one eye:     Problems with dizziness:         Gastrointestinal    Blood in stool:     Vomited blood:         Genitourinary    Burning when urinating:     Blood in urine:        Psychiatric    Major depression:         Hematologic    Bleeding problems:    Problems with blood clotting too easily:        Skin    Rashes or ulcers:        Constitutional    Fever or chills:      PHYSICAL EXAM There were no vitals filed for this visit.   Constitutional: well appearing. no distress. Appears well nourished.  Neurologic: CN intact. no focal findings. Normal gait and station. Psychiatric:  Mood and affect symmetric and appropriate. Eyes:  No icterus. No conjunctival pallor. Ears, nose, throat:  mucous membranes moist. Midline trachea.  Cardiac: regular rate and rhythm.  Respiratory:  unlabored. Abdominal: not distended..  Extremity: no edema. no cyanosis. no pallor.   PERTINENT LABORATORY AND RADIOLOGIC DATA  Most recent CBC    Latest Ref Rng & Units 07/22/2022    7:55  AM 03/20/2022    8:39 AM 12/30/2021    9:16 AM  CBC  WBC 4.0 - 10.5 K/uL 4.2  4.7  4.0   Hemoglobin 12.0 - 15.0 g/dL 11.9  12.4  12.1   Hematocrit 36.0 - 46.0 % 35.7  37.7  36.1   Platelets 150.0 - 400.0 K/uL 259.0  262.0  256.0      Most recent CMP    Latest Ref Rng & Units 07/22/2022    7:55 AM 03/20/2022    8:39 AM 12/30/2021    9:16 AM  CMP  Glucose 70 - 99 mg/dL 89  93  88   BUN 6 - 23 mg/dL '18  18  19   '$ Creatinine 0.40 - 1.20 mg/dL 0.61  0.72  0.62   Sodium 135 - 145 mEq/L 140  141  139   Potassium 3.5 - 5.1 mEq/L 4.6  4.7  4.6   Chloride 96 - 112 mEq/L 105  104  104   CO2 19 - 32 mEq/L '28  30  27   '$ Calcium 8.4 - 10.5 mg/dL 9.2  9.7  9.0   Total Protein 6.0 - 8.3 g/dL 6.7  6.6  6.5   Total Bilirubin 0.2 - 1.2 mg/dL 0.6  0.4  0.5   Alkaline Phos 39 - 117 U/L 43  46  50   AST 0 - 37 U/L '23  22  24   '$ ALT 0 - 35 U/L '16  14  16     '$ Renal function CrCl cannot be calculated (Patient's most recent lab result is older than the maximum 21 days allowed.).  No results found for: "HGBA1C"  LDL Cholesterol (Calc)  Date Value Ref Range Status  07/16/2020 93 mg/dL (calc) Final    Comment:    Reference range: <100 . Desirable range <100 mg/dL for primary prevention;   <70 mg/dL for patients with CHD or diabetic patients  with > or = 2 CHD risk factors. Marland Kitchen LDL-C is now calculated using the Martin-Hopkins  calculation, which is a validated novel method providing  better accuracy than the Friedewald equation in the  estimation of LDL-C.  Cresenciano Genre et al. Annamaria Helling. WG:[id number]): 2061-2068  (http://education.[facility].com/faq/FAQ164)    LDL Cholesterol  Date Value Ref Range Status  07/22/2022 100 (H) 0 - 99 mg/dL Final   Direct LDL  Date Value Ref Range Status  01/27/2010 117.5 mg/dL Final    Comment:    See lab report for associated comment(s)     Vascular Imaging: CT angiogram of head and neck personally reviewed in detail.  She has CT evidence of fibromuscular  dysplasia in bilateral internal carotid arteries.  Her distal left internal carotid artery near the skull base has a nearly 1 cm fusiform aneurysm without associated mural thrombus.  Yevonne Aline. Stanford Breed, MD [facility] Phone Number: [phone] 11/30/2022 9:12 PM  Total time spent on preparing this encounter including chart review, data review, collecting history, examining the patient, coordinating care for this new patient, 60 minutes.  Portions of this report may have been transcribed using voice recognition software.  Every effort has been made to ensure accuracy; however, inadvertent computerized transcription errors may still be present.

## 2022-12-01 ENCOUNTER — Ambulatory Visit (HOSPITAL_COMMUNITY)
Admission: RE | Admit: 2022-12-01 | Discharge: 2022-12-01 | Disposition: A | Discharge status: Home / Self Care | Payer: Medicare HMO | Source: Ambulatory Visit | Attending: Vascular Surgery | Admitting: Vascular Surgery

## 2022-12-01 ENCOUNTER — Encounter: Payer: Self-pay | Admitting: Vascular Surgery

## 2022-12-01 ENCOUNTER — Ambulatory Visit: Payer: Medicare HMO | Admitting: Vascular Surgery

## 2022-12-01 VITALS — BP 152/86 | HR 62 | Temp 98.5°F | Resp 20 | Ht 65.0 in | Wt 149.0 lb

## 2022-12-01 DIAGNOSIS — I773 Arterial fibromuscular dysplasia: Secondary | ICD-10-CM | POA: Insufficient documentation

## 2022-12-15 ENCOUNTER — Other Ambulatory Visit: Payer: Self-pay | Admitting: *Deleted

## 2022-12-15 DIAGNOSIS — I10 Essential (primary) hypertension: Secondary | ICD-10-CM

## 2022-12-15 MED ORDER — METOPROLOL TARTRATE 100 MG PO TABS: 100.0000 mg | ORAL_TABLET | Freq: Two times a day (BID) | ORAL | 3 refills | Status: DC

## 2022-12-16 DIAGNOSIS — M7541 Impingement syndrome of right shoulder: Secondary | ICD-10-CM | POA: Diagnosis not present

## 2022-12-16 DIAGNOSIS — M25511 Pain in right shoulder: Secondary | ICD-10-CM | POA: Diagnosis not present

## 2022-12-16 DIAGNOSIS — M79671 Pain in right foot: Secondary | ICD-10-CM | POA: Diagnosis not present

## 2022-12-16 DIAGNOSIS — S92351A Displaced fracture of fifth metatarsal bone, right foot, initial encounter for closed fracture: Secondary | ICD-10-CM | POA: Diagnosis not present

## 2022-12-16 DIAGNOSIS — G8929 Other chronic pain: Secondary | ICD-10-CM | POA: Diagnosis not present

## 2022-12-19 ENCOUNTER — Other Ambulatory Visit: Payer: Self-pay | Admitting: Family Medicine

## 2022-12-19 DIAGNOSIS — I10 Essential (primary) hypertension: Secondary | ICD-10-CM

## 2023-01-04 ENCOUNTER — Ambulatory Visit: Payer: Medicare HMO | Attending: Cardiology | Admitting: Cardiology

## 2023-01-04 ENCOUNTER — Encounter: Payer: Self-pay | Admitting: Family Medicine

## 2023-01-04 ENCOUNTER — Other Ambulatory Visit: Payer: Self-pay | Admitting: Family Medicine

## 2023-01-04 VITALS — BP 149/89 | HR 55 | Ht 65.0 in | Wt 148.0 lb

## 2023-01-04 DIAGNOSIS — I10 Essential (primary) hypertension: Secondary | ICD-10-CM | POA: Diagnosis not present

## 2023-01-04 DIAGNOSIS — I6523 Occlusion and stenosis of bilateral carotid arteries: Secondary | ICD-10-CM | POA: Diagnosis not present

## 2023-01-04 DIAGNOSIS — E785 Hyperlipidemia, unspecified: Secondary | ICD-10-CM | POA: Diagnosis not present

## 2023-01-04 DIAGNOSIS — F419 Anxiety disorder, unspecified: Secondary | ICD-10-CM

## 2023-01-04 DIAGNOSIS — R002 Palpitations: Secondary | ICD-10-CM | POA: Diagnosis not present

## 2023-01-04 MED ORDER — ESCITALOPRAM OXALATE 10 MG PO TABS: 10.0000 mg | ORAL_TABLET | Freq: Every day | ORAL | 2 refills | Status: DC

## 2023-01-04 NOTE — Progress Notes (Signed)
Virtual Visit via Telephone Note   Because of Janet Knox's co-morbid illnesses, she is at least at moderate risk for complications without adequate follow up.  This format is felt to be most appropriate for this patient at this time.  The patient did not have access to video technology/had technical difficulties with video requiring transitioning to audio format only (telephone).  All issues noted in this document were discussed and addressed.  No physical exam could be performed with this format.  Please refer to the patient's chart for her consent to [facility].   Patient has given verbal permission to conduct this visit via virtual appointment and to bill insurance 01/09/2023 5:42 PM     Evaluation Performed:  Follow-up visit  Date:  01/09/2023   ID:  Janet Knox, DOB 1955/11/09, MRN [id number]  Patient Location: Home Provider Location: Office/Clinic  PCP:  Donato SchultzLowne Chase, Yvonne R, DO  Cardiologist:  Bryan Lemmaavid Chancie Lampert, MD  Electrophysiologist:  None   Chief Complaint:   Chief Complaint  Patient presents with   Follow-up    Doing well.  No real chest pain pressure or dyspnea on exertion.  Rare episodes of palpitations.   ====================================  ASSESSMENT & PLAN:    Problem List Items Addressed This Visit       Cardiology Problems   Hyperlipidemia LDL goal <70 (Chronic)    Notable carotid artery disease, therefore would try to target LDL less than 70.  Thankfully, Coronary Calcium Score not very.  Therefore the main focus of treating lipids is her carotid artery > coronary artery prevention.  Most recent LDL was 100.  Would like to see it less than the LAD, closer to 70.  She is only taking niacin, we talked about over-the-counter options such as co-Q10, red yeast rice and fish oil in addition to niacin and fenofibrate.  Could also consider Nexlizet as she is somewhat reluctant to use statins.      Essential hypertension (Chronic)     BP was little high today.  She is on max dose of Toprol and losartan.  For next option, would probably consider thiazide versus converting to a higher potency ARB.      Carotid disease, bilateral (Chronic)    No bruit noted on exam.  Moderate disease seen on carotid Dopplers.  Definitely warrants control of blood pressure, lipids and sugars.  Her blood pressure was better during last visit, will defer management to PCP continue to monitor.  Also, will try to target LDL less than 70 based on carotid disease.  May need additional therapy as noted.        Other   Palpitations - Primary (Chronic)    Relief of the episodes of feeling her heart rate going up.  Nothing overly concerning.  She has had a lot of family social issues and stressful situations but seems to try these her heart rate is faster.  Not happening within a frequency to consider using a Zio patch monitor.  We discussed that if they get worse, she can consider the [facility] app. She is already taking 100 mg twice daily metoprolol.      ====================================  History of Present Illness:    Janet Knox is a 67 y.o. female with PMH notable for hypertension, hyperlipidemia, moderate carotid artery disease who presents via audio/video conferencing for a telehealth visit today as a 2818-month follow-up evaluation for palpitations and chest pain.Janet Mussel.  Donna J Shreve was last seen October 26, 2022 for evaluation of chest pain and palpitations.  By the time I saw her she was feeling relatively well.  Not necessarily noting palpitations just occasional skipped beats.  As such we decided not to do a monitor.  Palpitations usually associated with feeling stressed out about having dental surgery.  Also noted her blood pressures were very high at that time.  She is exercising with treadmill about 40 minutes at a time without any chest pain or pressure.  Unfortunately she got out of the habit of doing so over the last several  months and was out of shape.  Hospitalizations:  none  Recent - Interim CV studies:   The following studies were reviewed today: 10/28/2022-Coronary Calcium Score 20.4.  (LAD) 61st percentile (If CAC is 1 to 99, it is reasonable to initiate statin therapy for patients >=1 years of age). Carotid Dopplers 12/01/2022:  Right Carotid: Velocities in the right ICA are consistent with a 40- 59% stenosis. Non- hemodynamically significant plaque < 50% noted in the CCA. Elevated velocities in the ICA with highly tortuous geometry in the abscence of significant atherosclerotic plaque is suggestive of fibromuscular dysplasia.  Left Carotid: Velocities in the left ICA are consistent with a 40- 59% stenosis. Non- hemodynamically significant plaque < 50% noted in the CCA. Elevated velocities in the ICA with highly tortuous geometry in the abscence of significant atherosclerotic plaque is suggestive of fibromuscular dysplasia.  Vertebrals: Bilateral vertebral arteries demonstrate antegrade flow. Subclavians: Normal flow hemodynamics were seen in bilateral subclavian arteries.  Ardelle Lesches History   Janet Knox is being evaluated today via telemedicine (telephone call) for follow-up of her in January visit.  We reviewed the results of her coronary calcium score being relatively low risk.  We did discuss that it would be reasonable to consider lipid-lowering therapy.-She indicated being reluctant to consider statins.  Overall, she says that she is doing quite well.  She had carotid Dopplers done in March reviewed above there was evaluating for possible FMD.  She denies any resting exertional chest pain or pressure.  No further episodes since I saw her.  She is active exercising 4-5 times a week on the treadmill and exercise bicycle--usually about 45 minutes to almost an hour sometimes..  No concerns of exertional chest pain or dyspnea.  No resting symptoms.  She has noted a couple times where she has had some  palpitations and increased heart rate that tend to make her feel somewhat fatigued when she feels more awake.  But this is not very common or more frequent.  This usually associated with feeling stressed or anxious.  She describes it as feeling just a little "jittery ".  No syncope or near syncope.   Cardiovascular ROS: no chest pain or dyspnea on exertion positive for - irregular heartbeat, palpitations, rapid heart rate, and these are usually not very frequent and not associated with any significant symptoms. negative for - orthopnea, paroxysmal nocturnal dyspnea, shortness of breath, or syncope or near syncope, TIA/amaurosis fugax or claudication   ROS:  Please see the history of present illness.     Review of Systems  Constitutional:  Negative for malaise/fatigue and weight loss.  Gastrointestinal:  Negative for blood in stool, constipation and melena.  Genitourinary:  Negative for hematuria.  Musculoskeletal:  Positive for joint pain (Her injured toe is doing better.).  Neurological:  Positive for dizziness (She still has some spells nothing overly worrisome to her.). Negative for focal weakness and weakness.  Psychiatric/Behavioral:  Negative  for depression and memory loss. The patient is nervous/anxious (She has been short of episodes that are probably related to being nervous or anxious that she feels some skipping beats but otherwise relatively rare.).    Past Medical History:  Diagnosis Date   Anemia    Hyperlipidemia    Hypertension    Skin cancer    Past Surgical History:  Procedure Laterality Date   ABDOMINAL HYSTERECTOMY     EYE SURGERY Left    Torn Retina   KNEE SURGERY       Current Meds  Medication Sig   aspirin 81 MG tablet Take 81 mg by mouth daily.   atorvastatin (LIPITOR) 40 MG tablet Take 1 tablet (40 mg total) by mouth daily.   Cholecalciferol (VITAMIN D3) 5000 UNITS TABS Take 1 tablet by mouth daily.    Coenzyme Q10 (COQ-10) 200 MG CAPS Take 1 capsule by  mouth daily.   CVS SUNSCREEN SPF 30 EX apply   estradiol (ESTRACE) 2 MG tablet Take 1 tablet (2 mg total) by mouth daily.   fenofibrate 54 MG tablet Take 1 tablet (54 mg total) by mouth daily.   Ferrous Sulfate 27 MG TABS Take 1 tablet by mouth daily.   losartan (COZAAR) 100 MG tablet TAKE 1 TABLET EVERY DAY   metoprolol tartrate (LOPRESSOR) 100 MG tablet Take 1 tablet (100 mg total) by mouth 2 (two) times daily.   Multiple Vitamin (MULTIVITAMIN) tablet Take 1 tablet by mouth daily.   niacin 500 MG tablet Take 2 tablets (1,000 mg total) by mouth daily with breakfast.   Omega-3 Fatty Acids (FISH OIL) 1200 MG CAPS Take 1 capsule by mouth daily.    SUMAtriptan (IMITREX) 50 MG tablet TAKE 1 TABLET AS NEEDED FOR MIGRAINE, MAY REPEAT IN 2 HOURS IF HEADACHE PERSISTS OR RECURS   vitamin E 400 UNIT capsule Take 400 Units by mouth daily.     Allergies:   Latex   Social History   Tobacco Use   Smoking status: Never   Smokeless tobacco: Never  Vaping Use   Vaping Use: Never used  Substance Use Topics   Alcohol use: Yes    Alcohol/week: 1.0 standard drink of alcohol    Types: 1 Glasses of wine per week   Drug use: No     Family Hx: The patient's family history includes Alzheimer's disease in her mother; Atrial fibrillation in her father; Coronary artery disease in her paternal grandfather; Hyperlipidemia in her father and mother; Hypertension in her father and mother; Stroke in her paternal grandfather.   Labs/Other Tests and Data Reviewed:    EKG:  No ECG reviewed.  Recent Labs: 07/22/2022: ALT 16; BUN 18; Creatinine, Ser 0.61; Hemoglobin 11.9; Platelets 259.0; Potassium 4.6; Sodium 140; TSH 3.89   Recent Lipid Panel Lab Results  Component Value Date/Time   CHOL 186 07/22/2022 07:55 AM   TRIG 108.0 07/22/2022 07:55 AM   HDL 64.80 07/22/2022 07:55 AM   CHOLHDL 3 07/22/2022 07:55 AM   LDLCALC 100 (H) 07/22/2022 07:55 AM   LDLCALC 93 07/16/2020 11:25 AM   LDLDIRECT 117.5 01/27/2010  12:00 AM    Wt Readings from Last 3 Encounters:  01/04/23 148 lb (67.1 kg)  12/01/22 149 lb (67.6 kg)  10/26/22 150 lb 9.6 oz (68.3 kg)     Objective:    Vital Signs:  BP (!) 149/89   Pulse (!) 55   Ht 5\' 5"  (1.651 m)   Wt 148 lb (67.1 kg)  BMI 24.63 kg/m   VITAL SIGNS:  reviewed GEN:  no acute distress RESPIRATORY:  non-labored PSYCH:  normal affect  ==========================================  COVID-19 Education: The signs and symptoms of COVID-19 were discussed with the patient and how to seek care for testing (follow up with PCP or arrange E-visit).   The importance of social distancing was discussed today.  Time:   Today, I have spent 11 minutes with the patient with telehealth technology discussing the above problems.   An additional 10 minutes spent charting (reviewing prior notes, hospital records, studies, labs etc.) Total 21 minutes   Medication Adjustments/Labs and Tests Ordered: Current medicines are reviewed at length with the patient today.  Concerns regarding medicines are outlined above.   Patient Instructions  Medication Instructions:  No change for now *If you need a refill on your cardiac medications before your next appointment, please call your pharmacy*   Lab Work: Per PCP  If you have labs (blood work) drawn today and your tests are completely normal, you will receive your results only by: MyChart Message (if you have MyChart) OR A paper copy in the mail If you have any lab test that is abnormal or we need to change your treatment, we will call you to review the results.   Testing/Procedures: Should have annual Carotid Dopplers ordered @ f/u visit   Follow-Up: At [facility], you and your health needs are our priority.  As part of our continuing mission to provide you with exceptional heart care, we have created designated Provider Care Teams.  These Care Teams include your primary Cardiologist (physician) and Advanced Practice  Providers (APPs -  Physician Assistants and Nurse Practitioners) who all work together to provide you with the care you need, when you need it.  We recommend signing up for the patient portal called "MyChart".  Sign up information is provided on this After Visit Summary.  MyChart is used to connect with patients for Virtual Visits (Telemedicine).  Patients are able to view lab/test results, encounter notes, upcoming appointments, etc.  Non-urgent messages can be sent to your provider as well.   To learn more about what you can do with MyChart, go to [email or web address].    Your next appointment:   1 year(s)  Provider:   Micah Flesher, PA-C, Marjie Skiff, PA-C, Juanda Crumble, PA-C, Joni Reining, DNP, ANP, Bernadene Person, NP, or Carlos Levering, NP    Then, Bryan Lemma, MD will plan to see you again in 1 year(s).   Other Instructions N/a    Signed, Bryan Lemma, MD  01/09/2023 5:42 PM    [facility]

## 2023-01-06 ENCOUNTER — Telehealth: Payer: Self-pay | Admitting: Family Medicine

## 2023-01-06 NOTE — Telephone Encounter (Signed)
Pt called to advise that she took her first dose of lexapro last night and it cause her blood pressure to be the highest its ever been. This morning it was 165/105. She took her regular blood pressure medication and it has dropped down to 134/83. Pt said she is not going to take the medicine and will try to control her blood pressure with diet and exercise.

## 2023-01-06 NOTE — Telephone Encounter (Signed)
Not going to take the Lexapro

## 2023-01-07 ENCOUNTER — Encounter: Payer: Self-pay | Admitting: Family Medicine

## 2023-01-08 ENCOUNTER — Encounter: Payer: Self-pay | Admitting: Family Medicine

## 2023-01-09 ENCOUNTER — Encounter: Payer: Self-pay | Admitting: Cardiology

## 2023-01-09 NOTE — Assessment & Plan Note (Signed)
BP was little high today.  She is on max dose of Toprol and losartan.  For next option, would probably consider thiazide versus converting to a higher potency ARB.

## 2023-01-09 NOTE — Patient Instructions (Signed)
Medication Instructions:  No change for now *If you need a refill on your cardiac medications before your next appointment, please call your pharmacy*   Lab Work: Per PCP  If you have labs (blood work) drawn today and your tests are completely normal, you will receive your results only by: MyChart Message (if you have MyChart) OR A paper copy in the mail If you have any lab test that is abnormal or we need to change your treatment, we will call you to review the results.   Testing/Procedures: Should have annual Carotid Dopplers ordered @ f/u visit   Follow-Up: At [facility], you and your health needs are our priority.  As part of our continuing mission to provide you with exceptional heart care, we have created designated Provider Care Teams.  These Care Teams include your primary Cardiologist (physician) and Advanced Practice Providers (APPs -  Physician Assistants and Nurse Practitioners) who all work together to provide you with the care you need, when you need it.  We recommend signing up for the patient portal called "MyChart".  Sign up information is provided on this After Visit Summary.  MyChart is used to connect with patients for Virtual Visits (Telemedicine).  Patients are able to view lab/test results, encounter notes, upcoming appointments, etc.  Non-urgent messages can be sent to your provider as well.   To learn more about what you can do with MyChart, go to [email or web address].    Your next appointment:   1 year(s)  Provider:   Micah Flesher, PA-C, Marjie Skiff, PA-C, Juanda Crumble, PA-C, Joni Reining, DNP, ANP, Bernadene Person, NP, or Carlos Levering, NP    Then, Bryan Lemma, MD will plan to see you again in 1 year(s).   Other Instructions N/a

## 2023-01-09 NOTE — Assessment & Plan Note (Addendum)
Notable carotid artery disease, therefore would try to target LDL less than 70.  Thankfully, Coronary Calcium Score not very.  Therefore the main focus of treating lipids is her carotid artery > coronary artery prevention.  Most recent LDL was 100.  Would like to see it less than the LAD, closer to 70.  She is only taking niacin, we talked about over-the-counter options such as co-Q10, red yeast rice and fish oil in addition to niacin and fenofibrate.  Could also consider Nexlizet as she is somewhat reluctant to use statins.

## 2023-01-09 NOTE — Assessment & Plan Note (Signed)
Relief of the episodes of feeling her heart rate going up.  Nothing overly concerning.  She has had a lot of family social issues and stressful situations but seems to try these her heart rate is faster.  Not happening within a frequency to consider using a Zio patch monitor.  We discussed that if they get worse, she can consider the [facility] app. She is already taking 100 mg twice daily metoprolol.

## 2023-01-09 NOTE — Assessment & Plan Note (Addendum)
No bruit noted on exam.  Moderate disease seen on carotid Dopplers.  Definitely warrants control of blood pressure, lipids and sugars.  Her blood pressure was better during last visit, will defer management to PCP continue to monitor.  Also, will try to target LDL less than 70 based on carotid disease.  May need additional therapy as noted.

## 2023-01-11 MED ORDER — VALSARTAN-HYDROCHLOROTHIAZIDE 160-25 MG PO TABS: 1.0000 | ORAL_TABLET | Freq: Every day | ORAL | 2 refills | Status: DC

## 2023-01-15 ENCOUNTER — Other Ambulatory Visit (HOSPITAL_BASED_OUTPATIENT_CLINIC_OR_DEPARTMENT_OTHER): Payer: Self-pay | Admitting: Neurosurgery

## 2023-01-15 DIAGNOSIS — I720 Aneurysm of carotid artery: Secondary | ICD-10-CM

## 2023-01-16 ENCOUNTER — Ambulatory Visit (HOSPITAL_BASED_OUTPATIENT_CLINIC_OR_DEPARTMENT_OTHER)
Admission: RE | Admit: 2023-01-16 | Discharge: 2023-01-16 | Disposition: A | Discharge status: Home / Self Care | Payer: Medicare HMO | Source: Ambulatory Visit | Attending: Neurosurgery | Admitting: Neurosurgery

## 2023-01-16 DIAGNOSIS — I720 Aneurysm of carotid artery: Secondary | ICD-10-CM | POA: Insufficient documentation

## 2023-01-16 DIAGNOSIS — I6522 Occlusion and stenosis of left carotid artery: Secondary | ICD-10-CM | POA: Diagnosis not present

## 2023-02-02 ENCOUNTER — Encounter: Payer: Self-pay | Admitting: Family Medicine

## 2023-02-02 ENCOUNTER — Ambulatory Visit (INDEPENDENT_AMBULATORY_CARE_PROVIDER_SITE_OTHER): Payer: Medicare HMO | Admitting: Family Medicine

## 2023-02-02 ENCOUNTER — Other Ambulatory Visit (HOSPITAL_BASED_OUTPATIENT_CLINIC_OR_DEPARTMENT_OTHER): Payer: Self-pay | Admitting: Family Medicine

## 2023-02-02 VITALS — BP 136/74 | HR 75 | Temp 98.4°F | Resp 18 | Ht 65.0 in | Wt 147.0 lb

## 2023-02-02 DIAGNOSIS — Z1231 Encounter for screening mammogram for malignant neoplasm of breast: Secondary | ICD-10-CM

## 2023-02-02 DIAGNOSIS — E042 Nontoxic multinodular goiter: Secondary | ICD-10-CM

## 2023-02-02 DIAGNOSIS — Z8669 Personal history of other diseases of the nervous system and sense organs: Secondary | ICD-10-CM

## 2023-02-02 DIAGNOSIS — Z780 Asymptomatic menopausal state: Secondary | ICD-10-CM

## 2023-02-02 DIAGNOSIS — Z23 Encounter for immunization: Secondary | ICD-10-CM

## 2023-02-02 DIAGNOSIS — E2839 Other primary ovarian failure: Secondary | ICD-10-CM

## 2023-02-02 DIAGNOSIS — I10 Essential (primary) hypertension: Secondary | ICD-10-CM

## 2023-02-02 DIAGNOSIS — E785 Hyperlipidemia, unspecified: Secondary | ICD-10-CM | POA: Diagnosis not present

## 2023-02-02 DIAGNOSIS — Z0000 Encounter for general adult medical examination without abnormal findings: Secondary | ICD-10-CM

## 2023-02-02 LAB — COMPREHENSIVE METABOLIC PANEL
ALT: 17 U/L (ref 0–35)
AST: 25 U/L (ref 0–37)
Albumin: 4.1 g/dL (ref 3.5–5.2)
Alkaline Phosphatase: 49 U/L (ref 39–117)
BUN: 14 mg/dL (ref 6–23)
CO2: 29 mEq/L (ref 19–32)
Calcium: 9.1 mg/dL (ref 8.4–10.5)
Chloride: 102 mEq/L (ref 96–112)
Creatinine, Ser: 0.63 mg/dL (ref 0.40–1.20)
GFR: 91.72 mL/min (ref >60.00)
Glucose, Bld: 89 mg/dL (ref 70–99)
Potassium: 4.4 mEq/L (ref 3.5–5.1)
Sodium: 141 mEq/L (ref 135–145)
Total Bilirubin: 0.5 mg/dL (ref 0.2–1.2)
Total Protein: 6.7 g/dL (ref 6.0–8.3)

## 2023-02-02 LAB — CBC WITH DIFFERENTIAL/PLATELET
Basophils Absolute: 0 10*3/uL (ref 0.0–0.1)
Basophils Relative: 0.5 % (ref 0.0–3.0)
Eosinophils Absolute: 0.2 10*3/uL (ref 0.0–0.7)
Eosinophils Relative: 4.4 % (ref 0.0–5.0)
HCT: 36.1 % (ref 36.0–46.0)
Hemoglobin: 12.4 g/dL (ref 12.0–15.0)
Lymphocytes Relative: 24.6 % (ref 12.0–46.0)
Lymphs Abs: 1.2 10*3/uL (ref 0.7–4.0)
MCHC: 34.3 g/dL (ref 30.0–36.0)
MCV: 94.4 fl (ref 78.0–100.0)
Monocytes Absolute: 0.5 10*3/uL (ref 0.1–1.0)
Monocytes Relative: 11.1 % (ref 3.0–12.0)
Neutro Abs: 2.9 10*3/uL (ref 1.4–7.7)
Neutrophils Relative %: 59.4 % (ref 43.0–77.0)
Platelets: 261 10*3/uL (ref 150.0–400.0)
RBC: 3.82 Mil/uL — ABNORMAL LOW (ref 3.87–5.11)
RDW: 13.2 % (ref 11.5–15.5)
WBC: 4.8 10*3/uL (ref 4.0–10.5)

## 2023-02-02 LAB — LIPID PANEL
Cholesterol: 177 mg/dL (ref 0–200)
HDL: 55.2 mg/dL (ref >39.00)
LDL Cholesterol: 98 mg/dL (ref 0–99)
NonHDL: 122.06
Total CHOL/HDL Ratio: 3
Triglycerides: 121 mg/dL (ref 0.0–149.0)
VLDL: 24.2 mg/dL (ref 0.0–40.0)

## 2023-02-02 LAB — TSH: TSH: 2.84 u[IU]/mL (ref 0.35–5.50)

## 2023-02-02 MED ORDER — SUMATRIPTAN SUCCINATE 50 MG PO TABS: ORAL_TABLET | ORAL | 0 refills | Status: AC

## 2023-02-02 MED ORDER — ATORVASTATIN CALCIUM 40 MG PO TABS: 40.0000 mg | ORAL_TABLET | Freq: Every day | ORAL | 3 refills | Status: DC

## 2023-02-02 MED ORDER — ESTRADIOL 2 MG PO TABS: 2.0000 mg | ORAL_TABLET | Freq: Every day | ORAL | 3 refills | Status: DC

## 2023-02-02 MED ORDER — METOPROLOL TARTRATE 50 MG PO TABS: 50.0000 mg | ORAL_TABLET | Freq: Two times a day (BID) | ORAL | 3 refills | Status: DC

## 2023-02-02 MED ORDER — FENOFIBRATE 54 MG PO TABS: 54.0000 mg | ORAL_TABLET | Freq: Every day | ORAL | 3 refills | Status: DC

## 2023-02-02 MED ORDER — VALSARTAN-HYDROCHLOROTHIAZIDE 80-12.5 MG PO TABS: 1.0000 | ORAL_TABLET | Freq: Every day | ORAL | 3 refills | Status: DC

## 2023-02-02 NOTE — Assessment & Plan Note (Signed)
Tolerating statin, encouraged heart healthy diet, avoid trans fats, minimize simple carbs and saturated fats. Increase exercise as tolerated 

## 2023-02-02 NOTE — Progress Notes (Addendum)
Subjective:   By signing my name below, I, Shehryar Baig, attest that this documentation has been prepared under the direction and in the presence of Donato Schultz, DO. 02/02/2023   Patient ID: Janet Knox, female    DOB: 18-Sep-1956, 67 y.o.   MRN: [id number]  Chief Complaint  Patient presents with   Hypertension   Hyperlipidemia   Follow-up    Hypertension Pertinent negatives include no blurred vision, chest pain, headaches, malaise/fatigue, palpitations or shortness of breath.  Hyperlipidemia Pertinent negatives include no chest pain or shortness of breath.   Patient is in today for a follow up visit.   She measures her blood pressure and reports her blood pressure is stable while at home. She continues taking 160-25 mg Diovan HCT daily PO, 100 mg metoprolol tartrate and reports feeling occasional light headedness. She switched her 100 mg metoprolol tartrate back to 50 mg and reports her dizziness improved and blood pressure continued measuring normal while taking it. She also stopped taking lexapro due to finding her blood pressure became elevated while taking it.  BP Readings from Last 3 Encounters:  02/02/23 136/74  01/04/23 (!) 149/89  12/01/22 (!) 152/86   Pulse Readings from Last 3 Encounters:  02/02/23 75  01/04/23 (!) 55  12/01/22 62   She is exercising more often by using her treadmill at home.  Wt Readings from Last 3 Encounters:  02/02/23 147 lb (66.7 kg)  01/04/23 148 lb (67.1 kg)  12/01/22 149 lb (67.6 kg)   She denies any difficulty swallowing. She has no issues with her thyroid size at this time.  She has a mammogram scheduled. She reports being UTD on bone density.   Past Medical History:  Diagnosis Date   Anemia    Hyperlipidemia    Hypertension    Skin cancer     Past Surgical History:  Procedure Laterality Date   ABDOMINAL HYSTERECTOMY     EYE SURGERY Left    Torn Retina   KNEE SURGERY      Family History  Problem Relation Age  of Onset   Alzheimer's disease Mother    Hyperlipidemia Mother    Hypertension Mother    Coronary artery disease Paternal Grandfather    Stroke Paternal Grandfather    Hyperlipidemia Father    Hypertension Father    Atrial fibrillation Father     Social History   Socioeconomic History   Marital status: Married    Spouse name: Not on file   Number of children: Not on file   Years of education: Not on file   Highest education level: 12th grade  Occupational History   Occupation: [employer]  Tobacco Use   Smoking status: Never   Smokeless tobacco: Never  Vaping Use   Vaping Use: Never used  Substance and Sexual Activity   Alcohol use: Yes    Alcohol/week: 1.0 standard drink of alcohol    Types: 1 Glasses of wine per week   Drug use: No   Sexual activity: Yes    Partners: Male  Other Topics Concern   Not on file  Social History Narrative   Exercise-- 3 x a week for 1 hour   Social Determinants of Health   Financial Resource Strain: Low Risk  (01/26/2023)   Overall Financial Resource Strain (CARDIA)    Difficulty of Paying Living Expenses: Not hard at all  Food Insecurity: No Food Insecurity (01/26/2023)   Hunger Vital Sign    Worried About  Running Out of Food in the Last Year: Never true    Ran Out of Food in the Last Year: Never true  Transportation Needs: No Transportation Needs (01/26/2023)   PRAPARE - Administrator, Civil Service (Medical): No    Lack of Transportation (Non-Medical): No  Physical Activity: Insufficiently Active (01/26/2023)   Exercise Vital Sign    Days of Exercise per Week: 4 days    Minutes of Exercise per Session: 30 min  Stress: No Stress Concern Present (01/26/2023)   [employer] of Occupational Health - Occupational Stress Questionnaire    Feeling of Stress : Only a little  Social Connections: Moderately Integrated (01/26/2023)   Social Connection and Isolation Panel [NHANES]    Frequency of Communication with Friends  and Family: More than three times a week    Frequency of Social Gatherings with Friends and Family: Once a week    Attends Religious Services: More than 4 times per year    Active Member of [employer] or Organizations: No    Attends Banker Meetings: Never    Marital Status: Married  Catering manager Violence: Not At Risk (09/02/2022)   Humiliation, Afraid, Rape, and Kick questionnaire    Fear of Current or Ex-Partner: No    Emotionally Abused: No    Physically Abused: No    Sexually Abused: No    Outpatient Medications Prior to Visit  Medication Sig Dispense Refill   aspirin 81 MG tablet Take 81 mg by mouth daily.     Cholecalciferol (VITAMIN D3) 5000 UNITS TABS Take 1 tablet by mouth daily.      Coenzyme Q10 (COQ-10) 200 MG CAPS Take 1 capsule by mouth daily.     [employer] SUNSCREEN SPF 30 EX apply     Ferrous Sulfate 27 MG TABS Take 1 tablet by mouth daily.     Multiple Vitamin (MULTIVITAMIN) tablet Take 1 tablet by mouth daily.     niacin 500 MG tablet Take 2 tablets (1,000 mg total) by mouth daily with breakfast. 180 tablet 3   Omega-3 Fatty Acids (FISH OIL) 1200 MG CAPS Take 1 capsule by mouth daily.      vitamin E 400 UNIT capsule Take 400 Units by mouth daily.     atorvastatin (LIPITOR) 40 MG tablet Take 1 tablet (40 mg total) by mouth daily. 90 tablet 3   estradiol (ESTRACE) 2 MG tablet Take 1 tablet (2 mg total) by mouth daily. 90 tablet 3   fenofibrate 54 MG tablet Take 1 tablet (54 mg total) by mouth daily. 90 tablet 3   metoprolol tartrate (LOPRESSOR) 100 MG tablet Take 1 tablet (100 mg total) by mouth 2 (two) times daily. 180 tablet 3   SUMAtriptan (IMITREX) 50 MG tablet TAKE 1 TABLET AS NEEDED FOR MIGRAINE, MAY REPEAT IN 2 HOURS IF HEADACHE PERSISTS OR RECURS 30 tablet 0   valsartan-hydrochlorothiazide (DIOVAN HCT) 160-25 MG tablet Take 1 tablet by mouth daily. 30 tablet 2   escitalopram (LEXAPRO) 10 MG tablet Take 1 tablet (10 mg total) by mouth at bedtime. 30 tablet 2    No facility-administered medications prior to visit.    Allergies  Allergen Reactions   Latex Rash    Review of Systems  Constitutional:  Negative for fever and malaise/fatigue.  HENT:  Negative for congestion.        (-)difficulty swallowing  Eyes:  Negative for blurred vision.  Respiratory:  Negative for cough and shortness of breath.   Cardiovascular:  Negative for chest pain, palpitations and leg swelling.  Gastrointestinal:  Negative for abdominal pain, blood in stool, nausea and vomiting.  Genitourinary:  Negative for dysuria and frequency.  Musculoskeletal:  Negative for back pain and falls.  Skin:  Negative for rash.  Neurological:  Negative for dizziness, loss of consciousness and headaches.  Endo/Heme/Allergies:  Negative for environmental allergies.  Psychiatric/Behavioral:  Negative for depression. The patient is not nervous/anxious.        Objective:    Physical Exam Vitals and nursing note reviewed.  Constitutional:      General: She is not in acute distress.    Appearance: Normal appearance. She is well-developed. She is not ill-appearing.  HENT:     Head: Normocephalic and atraumatic.     Right Ear: External ear normal.     Left Ear: External ear normal.     Nose: Nose normal.     Mouth/Throat:     Mouth: Mucous membranes are dry.     Pharynx: No oropharyngeal exudate or posterior oropharyngeal erythema.  Eyes:     Extraocular Movements: Extraocular movements intact.     Conjunctiva/sclera: Conjunctivae normal.     Pupils: Pupils are equal, round, and reactive to light.  Neck:     Thyroid: No thyromegaly.     Vascular: No carotid bruit or JVD.  Cardiovascular:     Rate and Rhythm: Normal rate and regular rhythm.     Heart sounds: Normal heart sounds. No murmur heard.    No gallop.  Pulmonary:     Effort: Pulmonary effort is normal. No respiratory distress.     Breath sounds: Normal breath sounds. No wheezing or rales.  Chest:     Chest wall:  No tenderness.  Abdominal:     General: Abdomen is flat. Bowel sounds are normal.     Palpations: Abdomen is soft.     Tenderness: There is no abdominal tenderness. There is no guarding or rebound.  Musculoskeletal:        General: Normal range of motion.     Cervical back: Normal range of motion and neck supple.  Skin:    General: Skin is warm and dry.  Neurological:     General: No focal deficit present.     Mental Status: She is alert and oriented to person, place, and time. Mental status is at baseline.  Psychiatric:        Judgment: Judgment normal.     BP 136/74 (BP Location: Left Arm, Patient Position: Sitting, Cuff Size: Normal)   Pulse 75   Temp 98.4 F (36.9 C) (Oral)   Resp 18   Ht 5\' 5"  (1.651 m)   Wt 147 lb (66.7 kg)   SpO2 95%   BMI 24.46 kg/m  Wt Readings from Last 3 Encounters:  02/02/23 147 lb (66.7 kg)  01/04/23 148 lb (67.1 kg)  12/01/22 149 lb (67.6 kg)       Assessment & Plan:  Preventative health care Assessment & Plan: Ghm utd Check labs  See AVS  Health Maintenance  Topic Date Due   Pneumonia Vaccine 65+ Years old (2 of 2 - PCV) 12/27/2021   DEXA SCAN  10/09/2022   MAMMOGRAM  11/27/2022   COVID-19 Vaccine (4 - 2023-24 season) 02/18/2023 (Originally 05/29/2022)   INFLUENZA VACCINE  04/29/2023   Medicare Annual Wellness (AWV)  09/03/2023   COLONOSCOPY (Pts 45-45yrs Insurance coverage will need to be confirmed)  09/24/2025   DTaP/Tdap/Td (4 - Td or Tdap) 01/27/2033  Hepatitis C Screening  Completed   Zoster Vaccines- Shingrix  Completed   HPV VACCINES  Aged Out     Orders: -     Estradiol; Take 1 tablet (2 mg total) by mouth daily.  Dispense: 90 tablet; Refill: 3 -     Fenofibrate; Take 1 tablet (54 mg total) by mouth daily.  Dispense: 90 tablet; Refill: 3  Essential hypertension Assessment & Plan: Well controlled, no changes to meds. Encouraged heart healthy diet such as the DASH diet and exercise as tolerated.   Running low at  times ---- dec metoprolol 50 mg bid  Decrease diovan 80/12.5 daily   Orders: -     Valsartan-hydroCHLOROthiazide; Take 1 tablet by mouth daily.  Dispense: 90 tablet; Refill: 3 -     Metoprolol Tartrate; Take 1 tablet (50 mg total) by mouth 2 (two) times daily.  Dispense: 180 tablet; Refill: 3 -     CBC with Differential/Platelet -     Comprehensive metabolic panel -     Lipid panel -     TSH  Hyperlipidemia LDL goal <70 Assessment & Plan: Tolerating statin, encouraged heart healthy diet, avoid trans fats, minimize simple carbs and saturated fats. Increase exercise as tolerated   Orders: -     CBC with Differential/Platelet -     Comprehensive metabolic panel -     Lipid panel -     TSH  Hyperlipidemia, unspecified hyperlipidemia type -     Atorvastatin Calcium; Take 1 tablet (40 mg total) by mouth daily.  Dispense: 90 tablet; Refill: 3  Menopause -     Estradiol; Take 1 tablet (2 mg total) by mouth daily.  Dispense: 90 tablet; Refill: 3  History of migraine Assessment & Plan: Stable  No complaints  Orders: -     SUMAtriptan Succinate; TAKE 1 TABLET AS NEEDED FOR MIGRAINE, MAY REPEAT IN 2 HOURS IF HEADACHE PERSISTS OR RECURS  Dispense: 30 tablet; Refill: 0  Need for pneumococcal 20-valent conjugate vaccination -     Pneumococcal conjugate vaccine 20-valent  Estrogen deficiency -     DG Bone Density; Future  Multiple thyroid nodules Assessment & Plan: Check tsh     I, Donato Schultz, DO, personally preformed the services described in this documentation.  All medical record entries made by the scribe were at my direction and in my presence.  I have reviewed the chart and discharge instructions (if applicable) and agree that the record reflects my personal performance and is accurate and complete. 02/02/2023   I,Shehryar Baig,acting as a scribe for Donato Schultz, DO.,have documented all relevant documentation on the behalf of Donato Schultz, DO,as  directed by  Donato Schultz, DO while in the presence of Donato Schultz, DO.   Donato Schultz, DO

## 2023-02-02 NOTE — Assessment & Plan Note (Signed)
Check tsh 

## 2023-02-02 NOTE — Assessment & Plan Note (Signed)
Ghm utd Check labs  See AVS  Health Maintenance  Topic Date Due   Pneumonia Vaccine 61+ Years old (2 of 2 - PCV) 12/27/2021   DEXA SCAN  10/09/2022   MAMMOGRAM  11/27/2022   COVID-19 Vaccine (4 - 2023-24 season) 02/18/2023 (Originally 05/29/2022)   INFLUENZA VACCINE  04/29/2023   Medicare Annual Wellness (AWV)  09/03/2023   COLONOSCOPY (Pts 45-40yrs Insurance coverage will need to be confirmed)  09/24/2025   DTaP/Tdap/Td (4 - Td or Tdap) 01/27/2033   Hepatitis C Screening  Completed   Zoster Vaccines- Shingrix  Completed   HPV VACCINES  Aged Out

## 2023-02-02 NOTE — Assessment & Plan Note (Signed)
Well controlled, no changes to meds. Encouraged heart healthy diet such as the DASH diet and exercise as tolerated.   Running low at times ---- dec metoprolol 50 mg bid  Decrease diovan 80/12.5 daily

## 2023-02-02 NOTE — Assessment & Plan Note (Signed)
Stable No complaints  

## 2023-02-17 DIAGNOSIS — I720 Aneurysm of carotid artery: Secondary | ICD-10-CM | POA: Diagnosis not present

## 2023-02-24 ENCOUNTER — Inpatient Hospital Stay (HOSPITAL_BASED_OUTPATIENT_CLINIC_OR_DEPARTMENT_OTHER): Admission: RE | Admit: 2023-02-24 | Payer: Medicare HMO | Source: Ambulatory Visit

## 2023-02-24 ENCOUNTER — Other Ambulatory Visit (HOSPITAL_BASED_OUTPATIENT_CLINIC_OR_DEPARTMENT_OTHER): Payer: Medicare HMO

## 2023-03-03 ENCOUNTER — Ambulatory Visit (HOSPITAL_BASED_OUTPATIENT_CLINIC_OR_DEPARTMENT_OTHER)
Admission: RE | Admit: 2023-03-03 | Discharge: 2023-03-03 | Disposition: A | Discharge status: Home / Self Care | Payer: Medicare HMO | Source: Ambulatory Visit | Attending: Family Medicine | Admitting: Family Medicine

## 2023-03-03 ENCOUNTER — Encounter (HOSPITAL_BASED_OUTPATIENT_CLINIC_OR_DEPARTMENT_OTHER): Payer: Self-pay

## 2023-03-03 DIAGNOSIS — E2839 Other primary ovarian failure: Secondary | ICD-10-CM | POA: Diagnosis not present

## 2023-03-03 DIAGNOSIS — Z1231 Encounter for screening mammogram for malignant neoplasm of breast: Secondary | ICD-10-CM | POA: Insufficient documentation

## 2023-03-03 DIAGNOSIS — Z780 Asymptomatic menopausal state: Secondary | ICD-10-CM | POA: Diagnosis not present

## 2023-04-27 ENCOUNTER — Encounter: Payer: Self-pay | Admitting: Family Medicine

## 2023-04-27 ENCOUNTER — Ambulatory Visit (INDEPENDENT_AMBULATORY_CARE_PROVIDER_SITE_OTHER): Payer: Medicare HMO | Admitting: Family Medicine

## 2023-04-27 VITALS — BP 148/80 | HR 79 | Temp 99.1°F | Resp 18 | Ht 65.0 in | Wt 149.6 lb

## 2023-04-27 DIAGNOSIS — J029 Acute pharyngitis, unspecified: Secondary | ICD-10-CM | POA: Diagnosis not present

## 2023-04-27 MED ORDER — AMOXICILLIN 875 MG PO TABS: 875.0000 mg | ORAL_TABLET | Freq: Two times a day (BID) | ORAL | 0 refills | Status: AC

## 2023-04-27 NOTE — Progress Notes (Unsigned)
Established Patient Office Visit  Subjective   Patient ID: Janet Knox, female    DOB: [birth year]  Age: 67 y.o. MRN: [id number]  Chief Complaint  Patient presents with   Sore Throat    Sxs started last night, negative COVID yesterday. Temp 99.0. throat feels scratchy.    HPI Discussed the use of AI scribe software for clinical note transcription with the patient, who gave verbal consent to proceed.  History of Present Illness   The patient presents with a sore throat and low-grade fever. She reports that the symptoms began the previous night. She has been managing the sore throat with chloroceptic spray and gargling with salt water. For the fever, she has been taking Advil. She also reports clear nasal drainage. She denies significant coughing and body aches, describing only slight chills and body aches. She has a history of exposure to COVID-19 through family members but has tested negative for the virus. She also reports a constant runny nose, which she manages with daily Claritin and Flonase.      Patient Active Problem List   Diagnosis Date Noted   Pharyngitis 04/27/2023   Palpitations 10/13/2022   Chest pain 10/13/2022   Bilateral serous otitis media 06/11/2021   Carotid disease, bilateral (HCC) 03/23/2018   Bilateral carotid artery stenosis 02/04/2018   Multiple thyroid nodules 02/04/2018   History of migraine 02/04/2018   Preventative health care 01/05/2017   SKIN CANCER, HX OF 01/27/2010   CELLULITIS AND ABSCESS OF UNSPECIFIED DIGIT 03/22/2009   SINUSITIS - ACUTE-NOS 12/17/2008   Hyperlipidemia LDL goal <70 10/27/2006   ANEMIA-NOS 10/27/2006   Essential hypertension 10/27/2006   INSOMNIA 10/27/2006   Past Medical History:  Diagnosis Date   Anemia    Hyperlipidemia    Hypertension    Skin cancer    Past Surgical History:  Procedure Laterality Date   ABDOMINAL HYSTERECTOMY     EYE SURGERY Left    Torn Retina   KNEE SURGERY     Social History   Tobacco  Use   Smoking status: Never   Smokeless tobacco: Never  Vaping Use   Vaping status: Never Used  Substance Use Topics   Alcohol use: Yes    Alcohol/week: 1.0 standard drink of alcohol    Types: 1 Glasses of wine per week   Drug use: No   Social History   Socioeconomic History   Marital status: Married    Spouse name: Not on file   Number of children: Not on file   Years of education: Not on file   Highest education level: 12th grade  Occupational History   Occupation: [employer]  Tobacco Use   Smoking status: Never   Smokeless tobacco: Never  Vaping Use   Vaping status: Never Used  Substance and Sexual Activity   Alcohol use: Yes    Alcohol/week: 1.0 standard drink of alcohol    Types: 1 Glasses of wine per week   Drug use: No   Sexual activity: Yes    Partners: Male  Other Topics Concern   Not on file  Social History Narrative   Exercise-- 3 x a week for 1 hour   Social Determinants of Health   Financial Resource Strain: Low Risk  (01/26/2023)   Overall Financial Resource Strain (CARDIA)    Difficulty of Paying Living Expenses: Not hard at all  Food Insecurity: No Food Insecurity (01/26/2023)   Hunger Vital Sign    Worried About Programme researcher, broadcasting/film/video in  the Last Year: Never true    Ran Out of Food in the Last Year: Never true  Transportation Needs: No Transportation Needs (01/26/2023)   PRAPARE - Administrator, Civil Service (Medical): No    Lack of Transportation (Non-Medical): No  Physical Activity: Insufficiently Active (01/26/2023)   Exercise Vital Sign    Days of Exercise per Week: 4 days    Minutes of Exercise per Session: 30 min  Stress: No Stress Concern Present (01/26/2023)   [employer] of Occupational Health - Occupational Stress Questionnaire    Feeling of Stress : Only a little  Social Connections: Moderately Integrated (01/26/2023)   Social Connection and Isolation Panel [NHANES]    Frequency of Communication with Friends and  Family: More than three times a week    Frequency of Social Gatherings with Friends and Family: Once a week    Attends Religious Services: More than 4 times per year    Active Member of [employer] or Organizations: No    Attends Banker Meetings: Never    Marital Status: Married  Catering manager Violence: Not At Risk (09/02/2022)   Humiliation, Afraid, Rape, and Kick questionnaire    Fear of Current or Ex-Partner: No    Emotionally Abused: No    Physically Abused: No    Sexually Abused: No   Family Status  Relation Name Status   Mother  Deceased at age 38   PGF  Deceased   Father  Alive  No partnership data on file   Family History  Problem Relation Age of Onset   Alzheimer's disease Mother    Hyperlipidemia Mother    Hypertension Mother    Coronary artery disease Paternal Grandfather    Stroke Paternal Grandfather    Hyperlipidemia Father    Hypertension Father    Atrial fibrillation Father    Allergies  Allergen Reactions   Latex Rash      Review of Systems  Constitutional:  Positive for fever and malaise/fatigue.  HENT:  Positive for congestion and sore throat.   Eyes:  Negative for blurred vision.  Respiratory:  Negative for shortness of breath.   Cardiovascular:  Negative for chest pain, palpitations and leg swelling.  Gastrointestinal:  Negative for abdominal pain, blood in stool and nausea.  Genitourinary:  Negative for dysuria and frequency.  Musculoskeletal:  Negative for falls.  Skin:  Negative for rash.  Neurological:  Negative for dizziness, loss of consciousness and headaches.  Endo/Heme/Allergies:  Negative for environmental allergies.  Psychiatric/Behavioral:  Negative for depression. The patient is not nervous/anxious.       Objective:     BP (!) 148/80 (BP Location: Left Arm, Patient Position: Sitting, Cuff Size: Normal)   Pulse 79   Temp 99.1 F (37.3 C) (Oral)   Resp 18   Ht 5\' 5"  (1.651 m)   Wt 149 lb 9.6 oz (67.9 kg)   SpO2  98%   BMI 24.89 kg/m  BP Readings from Last 3 Encounters:  04/27/23 (!) 148/80  02/02/23 136/74  01/04/23 (!) 149/89   Wt Readings from Last 3 Encounters:  04/27/23 149 lb 9.6 oz (67.9 kg)  02/02/23 147 lb (66.7 kg)  01/04/23 148 lb (67.1 kg)   SpO2 Readings from Last 3 Encounters:  04/27/23 98%  02/02/23 95%  12/01/22 99%      Physical Exam Vitals and nursing note reviewed.  Constitutional:      General: She is not in acute distress.  Appearance: Normal appearance. She is well-developed.  HENT:     Head: Normocephalic and atraumatic.     Nose: Congestion present.     Mouth/Throat:     Pharynx: Pharyngeal swelling and posterior oropharyngeal erythema present. No oropharyngeal exudate.  Eyes:     General: No scleral icterus.       Right eye: No discharge.        Left eye: No discharge.  Cardiovascular:     Rate and Rhythm: Normal rate and regular rhythm.     Heart sounds: No murmur heard. Pulmonary:     Effort: Pulmonary effort is normal. No respiratory distress.     Breath sounds: Normal breath sounds.  Musculoskeletal:        General: Normal range of motion.     Cervical back: Normal range of motion and neck supple.     Right lower leg: No edema.     Left lower leg: No edema.  Skin:    General: Skin is warm and dry.  Neurological:     Mental Status: She is alert and oriented to person, place, and time.  Psychiatric:        Mood and Affect: Mood normal.        Behavior: Behavior normal.        Thought Content: Thought content normal.        Judgment: Judgment normal.      No results found for any visits on 04/27/23.  Last CBC Lab Results  Component Value Date   WBC 4.8 02/02/2023   HGB 12.4 02/02/2023   HCT 36.1 02/02/2023   MCV 94.4 02/02/2023   MCH 31.8 08/26/2016   RDW 13.2 02/02/2023   PLT 261.0 02/02/2023   Last metabolic panel Lab Results  Component Value Date   GLUCOSE 89 02/02/2023   NA 141 02/02/2023   K 4.4 02/02/2023   CL 102  02/02/2023   CO2 29 02/02/2023   BUN 14 02/02/2023   CREATININE 0.63 02/02/2023   GFR 91.72 02/02/2023   CALCIUM 9.1 02/02/2023   PROT 6.7 02/02/2023   ALBUMIN 4.1 02/02/2023   BILITOT 0.5 02/02/2023   ALKPHOS 49 02/02/2023   AST 25 02/02/2023   ALT 17 02/02/2023   ANIONGAP 10 08/26/2016   Last lipids Lab Results  Component Value Date   CHOL 177 02/02/2023   HDL 55.20 02/02/2023   LDLCALC 98 02/02/2023   LDLDIRECT 117.5 01/27/2010   TRIG 121.0 02/02/2023   CHOLHDL 3 02/02/2023   Last hemoglobin A1c No results found for: "HGBA1C" Last thyroid functions Lab Results  Component Value Date   TSH 2.84 02/02/2023   T4TOTAL 9.3 06/30/2021   Last vitamin D No results found for: "25OHVITD2", "25OHVITD3", "VD25OH" Last vitamin B12 and Folate Lab Results  Component Value Date   VITAMINB12 642 01/27/2010   FOLATE 19.0 01/27/2010      The 10-year ASCVD risk score (Arnett DK, et al., 2019) is: 21.1%    Assessment & Plan:   Problem List Items Addressed This Visit       Unprioritized   Pharyngitis - Primary   Relevant Medications   amoxicillin (AMOXIL) 875 MG tablet   Other Relevant Orders   POC COVID-19   POCT rapid strep A   Assessment and Plan    Pharyngitis: Low-grade fever, sore throat, and erythema on examination. Negative COVID and strep tests, but poor sample collection due to gag reflex. Decision to treat empirically for strep given clinical presentation. -Start Amoxicillin. -  Contagious for 24 hours after starting antibiotics.  Chronic Rhinitis: Persistent nasal drainage. Currently using Flonase and Claritin. -Continue Flonase and Claritin. -Consider adding Astepro (over-the-counter antihistamine nasal spray) if symptoms persist.       No follow-ups on file.    Donato Schultz, DO

## 2023-04-29 ENCOUNTER — Other Ambulatory Visit: Payer: Self-pay | Admitting: Family Medicine

## 2023-04-29 ENCOUNTER — Telehealth: Payer: Self-pay | Admitting: Family Medicine

## 2023-04-29 DIAGNOSIS — U071 COVID-19: Secondary | ICD-10-CM

## 2023-04-29 MED ORDER — NIRMATRELVIR/RITONAVIR (PAXLOVID)TABLET: 3.0000 | ORAL_TABLET | Freq: Two times a day (BID) | ORAL | 0 refills | Status: AC

## 2023-04-29 NOTE — Telephone Encounter (Signed)
Pt made aware

## 2023-04-29 NOTE — Telephone Encounter (Signed)
Pt said that she was in to see Dr. Laury Axon and tested negative for covid and strep but because her throat was bad, Dr Laury Axon treated her for strep. Pt said this morning she did a home Covid test and it was bright pink (positive) and she wasn't sure if Dr. Laury Axon would need to change her medication. Please call her to advise if something different will be called in for her. Pt uses [facility] on [address]

## 2023-07-19 DIAGNOSIS — D1801 Hemangioma of skin and subcutaneous tissue: Secondary | ICD-10-CM | POA: Diagnosis not present

## 2023-07-19 DIAGNOSIS — L821 Other seborrheic keratosis: Secondary | ICD-10-CM | POA: Diagnosis not present

## 2023-07-19 DIAGNOSIS — D239 Other benign neoplasm of skin, unspecified: Secondary | ICD-10-CM | POA: Diagnosis not present

## 2023-08-06 ENCOUNTER — Ambulatory Visit (INDEPENDENT_AMBULATORY_CARE_PROVIDER_SITE_OTHER): Payer: Medicare HMO | Admitting: Family Medicine

## 2023-08-06 ENCOUNTER — Encounter: Payer: Self-pay | Admitting: Family Medicine

## 2023-08-06 VITALS — BP 112/60 | HR 64 | Temp 98.5°F | Resp 18 | Ht 65.0 in | Wt 148.2 lb

## 2023-08-06 DIAGNOSIS — Z0000 Encounter for general adult medical examination without abnormal findings: Secondary | ICD-10-CM

## 2023-08-06 DIAGNOSIS — E785 Hyperlipidemia, unspecified: Secondary | ICD-10-CM

## 2023-08-06 DIAGNOSIS — E559 Vitamin D deficiency, unspecified: Secondary | ICD-10-CM

## 2023-08-06 DIAGNOSIS — Z23 Encounter for immunization: Secondary | ICD-10-CM | POA: Diagnosis not present

## 2023-08-06 DIAGNOSIS — E041 Nontoxic single thyroid nodule: Secondary | ICD-10-CM | POA: Diagnosis not present

## 2023-08-06 DIAGNOSIS — I10 Essential (primary) hypertension: Secondary | ICD-10-CM

## 2023-08-06 LAB — CBC WITH DIFFERENTIAL/PLATELET
Basophils Absolute: 0 10*3/uL (ref 0.0–0.1)
Basophils Relative: 0.7 % (ref 0.0–3.0)
Eosinophils Absolute: 0.1 10*3/uL (ref 0.0–0.7)
Eosinophils Relative: 2.3 % (ref 0.0–5.0)
HCT: 37.3 % (ref 36.0–46.0)
Hemoglobin: 12.3 g/dL (ref 12.0–15.0)
Lymphocytes Relative: 28.1 % (ref 12.0–46.0)
Lymphs Abs: 1.2 10*3/uL (ref 0.7–4.0)
MCHC: 33 g/dL (ref 30.0–36.0)
MCV: 95.6 fL (ref 78.0–100.0)
Monocytes Absolute: 0.4 10*3/uL (ref 0.1–1.0)
Monocytes Relative: 8.7 % (ref 3.0–12.0)
Neutro Abs: 2.7 10*3/uL (ref 1.4–7.7)
Neutrophils Relative %: 60.2 % (ref 43.0–77.0)
Platelets: 279 10*3/uL (ref 150.0–400.0)
RBC: 3.91 Mil/uL (ref 3.87–5.11)
RDW: 13.3 % (ref 11.5–15.5)
WBC: 4.4 10*3/uL (ref 4.0–10.5)

## 2023-08-06 LAB — VITAMIN D 25 HYDROXY (VIT D DEFICIENCY, FRACTURES): VITD: 42.22 ng/mL (ref 30.00–100.00)

## 2023-08-06 LAB — LIPID PANEL
Cholesterol: 198 mg/dL (ref 0–200)
HDL: 63.2 mg/dL (ref >39.00)
LDL Cholesterol: 108 mg/dL — ABNORMAL HIGH (ref 0–99)
NonHDL: 134.32
Total CHOL/HDL Ratio: 3
Triglycerides: 130 mg/dL (ref 0.0–149.0)
VLDL: 26 mg/dL (ref 0.0–40.0)

## 2023-08-06 LAB — COMPREHENSIVE METABOLIC PANEL
ALT: 15 U/L (ref 0–35)
AST: 21 U/L (ref 0–37)
Albumin: 4.2 g/dL (ref 3.5–5.2)
Alkaline Phosphatase: 46 U/L (ref 39–117)
BUN: 19 mg/dL (ref 6–23)
CO2: 28 meq/L (ref 19–32)
Calcium: 9.5 mg/dL (ref 8.4–10.5)
Chloride: 104 meq/L (ref 96–112)
Creatinine, Ser: 0.73 mg/dL (ref 0.40–1.20)
GFR: 84.72 mL/min (ref >60.00)
Glucose, Bld: 97 mg/dL (ref 70–99)
Potassium: 4.6 meq/L (ref 3.5–5.1)
Sodium: 140 meq/L (ref 135–145)
Total Bilirubin: 0.5 mg/dL (ref 0.2–1.2)
Total Protein: 6.7 g/dL (ref 6.0–8.3)

## 2023-08-06 LAB — TSH: TSH: 3.63 u[IU]/mL (ref 0.35–5.50)

## 2023-08-06 NOTE — Assessment & Plan Note (Signed)
Ghm utd Check labs  See AVS Health Maintenance  Topic Date Due   Medicare Annual Wellness (AWV)  09/03/2023   COVID-19 Vaccine (4 - 2023-24 season) 08/22/2023 (Originally 05/30/2023)   MAMMOGRAM  03/02/2024   DEXA SCAN  03/02/2025   Colonoscopy  09/24/2025   DTaP/Tdap/Td (4 - Td or Tdap) 01/27/2033   Pneumonia Vaccine 33+ Years old  Completed   INFLUENZA VACCINE  Completed   Hepatitis C Screening  Completed   Zoster Vaccines- Shingrix  Completed   HPV VACCINES  Aged Out

## 2023-08-06 NOTE — Assessment & Plan Note (Signed)
Well controlled, no changes to meds. Encouraged heart healthy diet such as the DASH diet and exercise as tolerated.  °

## 2023-08-06 NOTE — Progress Notes (Signed)
Established Patient Office Visit  Subjective   Patient ID: Janet Knox, female    DOB: March 01, 1956  Age: 67 y.o. MRN: [id number]  Chief Complaint  Patient presents with   Annual Exam    Pt states fasting     HPI Discussed the use of AI scribe software for clinical note transcription with the patient, who gave verbal consent to proceed.  History of Present Illness   The patient, with a history of Irritable Bowel Syndrome (IBS), presents with an increase in symptoms. She reports alternating periods of increased bowel movements and constipation. The patient manages these symptoms with occasional use of stool softeners and has not tried probiotics. The patient also mentions a small bump on the tongue, which was recently evaluated by a dentist and deemed not concerning. She is here for a complete physical as well.   The patient has a recent history of COVID-19 infection, which she managed with Paxlovid and recovered within a week. However, she reports a persistent lack of energy since the infection. She also mentions a family history of severe COVID-19 infection, with a relative developing complications post-vaccination.  The patient has received the RSV vaccine in preparation for babysitting her grandchild. She has also completed the pneumonia and shingles vaccines. She is due for a flu shot.  The patient's lifestyle has been busy recently due to family obligations, including clearing out a relative's townhouse, which has temporarily disrupted her regular exercise routine. She plans to return to exercising three days a week for an hour.  The patient's father has a history of hospital delirium, requiring family members to stay with him during hospital admissions. The patient's mother is currently in a nursing home due to health issues, including diabetes. The patient's sister is also dealing with severe constipation, requiring aggressive medical management.      Patient Active Problem List    Diagnosis Date Noted   Pharyngitis 04/27/2023   Palpitations 10/13/2022   Chest pain 10/13/2022   Bilateral serous otitis media 06/11/2021   Carotid disease, bilateral (HCC) 03/23/2018   Bilateral carotid artery stenosis 02/04/2018   Multiple thyroid nodules 02/04/2018   History of migraine 02/04/2018   Preventative health care 01/05/2017   SKIN CANCER, HX OF 01/27/2010   CELLULITIS AND ABSCESS OF UNSPECIFIED DIGIT 03/22/2009   SINUSITIS - ACUTE-NOS 12/17/2008   Hyperlipidemia LDL goal <70 10/27/2006   ANEMIA-NOS 10/27/2006   Essential hypertension 10/27/2006   INSOMNIA 10/27/2006   Past Medical History:  Diagnosis Date   Anemia    Hyperlipidemia    Hypertension    Skin cancer    Past Surgical History:  Procedure Laterality Date   ABDOMINAL HYSTERECTOMY     EYE SURGERY Left    Torn Retina   KNEE SURGERY     Social History   Tobacco Use   Smoking status: Never   Smokeless tobacco: Never  Vaping Use   Vaping status: Never Used  Substance Use Topics   Alcohol use: Yes    Alcohol/week: 1.0 standard drink of alcohol    Types: 1 Glasses of wine per week   Drug use: No   Social History   Socioeconomic History   Marital status: Married    Spouse name: Not on file   Number of children: Not on file   Years of education: Not on file   Highest education level: 12th grade  Occupational History   Occupation: [employer]  Tobacco Use   Smoking status: Never   Smokeless  tobacco: Never  Vaping Use   Vaping status: Never Used  Substance and Sexual Activity   Alcohol use: Yes    Alcohol/week: 1.0 standard drink of alcohol    Types: 1 Glasses of wine per week   Drug use: No   Sexual activity: Yes    Partners: Male  Other Topics Concern   Not on file  Social History Narrative   Exercise-- 3 x a week for 1 hour   Social Determinants of Health   Financial Resource Strain: Low Risk  (01/26/2023)   Overall Financial Resource Strain (CARDIA)    Difficulty of  Paying Living Expenses: Not hard at all  Food Insecurity: No Food Insecurity (01/26/2023)   Hunger Vital Sign    Worried About Running Out of Food in the Last Year: Never true    Ran Out of Food in the Last Year: Never true  Transportation Needs: No Transportation Needs (01/26/2023)   PRAPARE - Administrator, Civil Service (Medical): No    Lack of Transportation (Non-Medical): No  Physical Activity: Insufficiently Active (01/26/2023)   Exercise Vital Sign    Days of Exercise per Week: 4 days    Minutes of Exercise per Session: 30 min  Stress: No Stress Concern Present (01/26/2023)   [employer] of Occupational Health - Occupational Stress Questionnaire    Feeling of Stress : Only a little  Social Connections: Moderately Integrated (01/26/2023)   Social Connection and Isolation Panel [NHANES]    Frequency of Communication with Friends and Family: More than three times a week    Frequency of Social Gatherings with Friends and Family: Once a week    Attends Religious Services: More than 4 times per year    Active Member of [employer] or Organizations: No    Attends Banker Meetings: Never    Marital Status: Married  Catering manager Violence: Not At Risk (09/02/2022)   Humiliation, Afraid, Rape, and Kick questionnaire    Fear of Current or Ex-Partner: No    Emotionally Abused: No    Physically Abused: No    Sexually Abused: No   Family Status  Relation Name Status   Mother  Deceased at age 74   PGF  Deceased   Father  Alive  No partnership data on file   Family History  Problem Relation Age of Onset   Alzheimer's disease Mother    Hyperlipidemia Mother    Hypertension Mother    Coronary artery disease Paternal Grandfather    Stroke Paternal Grandfather    Hyperlipidemia Father    Hypertension Father    Atrial fibrillation Father    Allergies  Allergen Reactions   Latex Rash      Review of Systems  Constitutional:  Negative for chills, fever and  malaise/fatigue.  HENT:  Negative for congestion and hearing loss.   Eyes:  Negative for blurred vision and discharge.  Respiratory:  Negative for cough, sputum production and shortness of breath.   Cardiovascular:  Negative for chest pain, palpitations and leg swelling.  Gastrointestinal:  Positive for constipation and diarrhea. Negative for abdominal pain, blood in stool, heartburn, nausea and vomiting.  Genitourinary:  Negative for dysuria, frequency, hematuria and urgency.  Musculoskeletal:  Negative for back pain, falls and myalgias.  Skin:  Negative for rash.  Neurological:  Negative for dizziness, sensory change, loss of consciousness, weakness and headaches.  Endo/Heme/Allergies:  Negative for environmental allergies. Does not bruise/bleed easily.  Psychiatric/Behavioral:  Negative for depression and  suicidal ideas. The patient is not nervous/anxious and does not have insomnia.       Objective:     BP 112/60 (BP Location: Left Arm, Patient Position: Sitting, Cuff Size: Normal)   Pulse 64   Temp 98.5 F (36.9 C) (Oral)   Resp 18   Ht 5\' 5"  (1.651 m)   Wt 148 lb 3.2 oz (67.2 kg)   SpO2 97%   BMI 24.66 kg/m  BP Readings from Last 3 Encounters:  08/06/23 112/60  04/27/23 (!) 148/80  02/02/23 136/74   Wt Readings from Last 3 Encounters:  08/06/23 148 lb 3.2 oz (67.2 kg)  04/27/23 149 lb 9.6 oz (67.9 kg)  02/02/23 147 lb (66.7 kg)      Physical Exam Vitals and nursing note reviewed.  Constitutional:      General: She is not in acute distress.    Appearance: Normal appearance. She is well-developed.  HENT:     Head: Normocephalic and atraumatic.     Right Ear: Tympanic membrane, ear canal and external ear normal. There is no impacted cerumen.     Left Ear: Tympanic membrane, ear canal and external ear normal. There is no impacted cerumen.     Nose: Nose normal.     Mouth/Throat:     Mouth: Mucous membranes are moist.     Pharynx: Oropharynx is clear. No  oropharyngeal exudate or posterior oropharyngeal erythema.  Eyes:     General: No scleral icterus.       Right eye: No discharge.        Left eye: No discharge.     Conjunctiva/sclera: Conjunctivae normal.     Pupils: Pupils are equal, round, and reactive to light.  Neck:     Thyroid: No thyromegaly or thyroid tenderness.     Vascular: No JVD.  Cardiovascular:     Rate and Rhythm: Normal rate and regular rhythm.     Heart sounds: Normal heart sounds. No murmur heard. Pulmonary:     Effort: Pulmonary effort is normal. No respiratory distress.     Breath sounds: Normal breath sounds.  Abdominal:     General: Bowel sounds are normal. There is no distension.     Palpations: Abdomen is soft. There is no mass.     Tenderness: There is no abdominal tenderness. There is no guarding or rebound.  Genitourinary:    Vagina: Normal.  Musculoskeletal:        General: Normal range of motion.     Cervical back: Normal range of motion and neck supple.     Right lower leg: No edema.     Left lower leg: No edema.  Lymphadenopathy:     Cervical: No cervical adenopathy.  Skin:    General: Skin is warm and dry.     Findings: No erythema or rash.  Neurological:     Mental Status: She is alert and oriented to person, place, and time.     Cranial Nerves: No cranial nerve deficit.     Deep Tendon Reflexes: Reflexes are normal and symmetric.  Psychiatric:        Mood and Affect: Mood normal.        Behavior: Behavior normal.        Thought Content: Thought content normal.        Judgment: Judgment normal.      No results found for any visits on 08/06/23.  Last CBC Lab Results  Component Value Date   WBC 4.8 02/02/2023  HGB 12.4 02/02/2023   HCT 36.1 02/02/2023   MCV 94.4 02/02/2023   MCH 31.8 08/26/2016   RDW 13.2 02/02/2023   PLT 261.0 02/02/2023   Last metabolic panel Lab Results  Component Value Date   GLUCOSE 89 02/02/2023   NA 141 02/02/2023   K 4.4 02/02/2023   CL 102  02/02/2023   CO2 29 02/02/2023   BUN 14 02/02/2023   CREATININE 0.63 02/02/2023   GFR 91.72 02/02/2023   CALCIUM 9.1 02/02/2023   PROT 6.7 02/02/2023   ALBUMIN 4.1 02/02/2023   BILITOT 0.5 02/02/2023   ALKPHOS 49 02/02/2023   AST 25 02/02/2023   ALT 17 02/02/2023   ANIONGAP 10 08/26/2016   Last lipids Lab Results  Component Value Date   CHOL 177 02/02/2023   HDL 55.20 02/02/2023   LDLCALC 98 02/02/2023   LDLDIRECT 117.5 01/27/2010   TRIG 121.0 02/02/2023   CHOLHDL 3 02/02/2023   Last hemoglobin A1c No results found for: "HGBA1C" Last thyroid functions Lab Results  Component Value Date   TSH 2.84 02/02/2023   T4TOTAL 9.3 06/30/2021   Last vitamin D No results found for: "25OHVITD2", "25OHVITD3", "VD25OH" Last vitamin B12 and Folate Lab Results  Component Value Date   VITAMINB12 642 01/27/2010   FOLATE 19.0 01/27/2010      The 10-year ASCVD risk score (Arnett DK, et al., 2019) is: 14.1%    Assessment & Plan:   Problem List Items Addressed This Visit       Unprioritized   Preventative health care    Ghm utd Check labs  See AVS Health Maintenance  Topic Date Due   Medicare Annual Wellness (AWV)  09/03/2023   COVID-19 Vaccine (4 - 2023-24 season) 08/22/2023 (Originally 05/30/2023)   MAMMOGRAM  03/02/2024   DEXA SCAN  03/02/2025   Colonoscopy  09/24/2025   DTaP/Tdap/Td (4 - Td or Tdap) 01/27/2033   Pneumonia Vaccine 18+ Years old  Completed   INFLUENZA VACCINE  Completed   Hepatitis C Screening  Completed   Zoster Vaccines- Shingrix  Completed   HPV VACCINES  Aged Out         Hyperlipidemia LDL goal <70 (Chronic)    Encourage heart healthy diet such as MIND or DASH diet, increase exercise, avoid trans fats, simple carbohydrates and processed foods, consider a krill or fish or flaxseed oil cap daily.        Essential hypertension - Primary (Chronic)    Well controlled, no changes to meds. Encouraged heart healthy diet such as the DASH diet and  exercise as tolerated.        Relevant Orders   CBC with Differential/Platelet   TSH   Other Visit Diagnoses     Hyperlipidemia, unspecified hyperlipidemia type       Relevant Orders   CBC with Differential/Platelet   Comprehensive metabolic panel   Lipid panel   Thyroid nodule       Relevant Orders   CBC with Differential/Platelet   TSH   Vitamin D deficiency       Relevant Orders   VITAMIN D 25 Hydroxy (Vit-D Deficiency, Fractures)   Need for influenza vaccination       Relevant Orders   Flu Vaccine Trivalent High Dose (Fluad) (Completed)     Assessment and Plan    Irritable Bowel Syndrome (IBS)   She is experiencing alternating episodes of diarrhea and constipation, currently managed with stool softeners. We discussed the addition of a probiotic such as Librarian, academic and  agreed to monitor symptoms, reporting if she interferes with daily activities.  Oral Lesion   A small bump on the tongue was noticed by her dentist, with no current symptoms or visible changes. We advised monitoring the lesion for changes in size or symptoms and considering a referral to an oral surgeon or ENT if the lesion enlarges.  General Health Maintenance   During her routine health maintenance visit, we discussed vaccinations and screenings. We talked about the COVID-19 vaccine, including the FDA recommendation for two doses per year for individuals over 48 and those with extenuating health conditions. She expressed concerns about side effects and personal experiences with COVID-19 and the vaccine. We administered the flu shot and discussed the COVID-19 vaccine, leaving the decision to her personal judgment. We confirmed that she received the RSV vaccine in May, along with the pneumonia and shingles vaccines, requiring no further doses. We also confirmed that she received the tetanus vaccine in May, with the next dose due in 10 years.  Follow-up   We will order routine labs and schedule the next routine visit  as needed.        No follow-ups on file.    Donato Schultz, DO

## 2023-08-06 NOTE — Assessment & Plan Note (Signed)
Encourage heart healthy diet such as MIND or DASH diet, increase exercise, avoid trans fats, simple carbohydrates and processed foods, consider a krill or fish or flaxseed oil cap daily.  °

## 2023-08-06 NOTE — Patient Instructions (Signed)
Preventive Care 65 Years and Older, Female Preventive care refers to lifestyle choices and visits with your health care provider that can promote health and wellness. Preventive care visits are also called wellness exams. What can I expect for my preventive care visit? Counseling Your health care provider may ask you questions about your: Medical history, including: Past medical problems. Family medical history. Pregnancy and menstrual history. History of falls. Current health, including: Memory and ability to understand (cognition). Emotional well-being. Home life and relationship well-being. Sexual activity and sexual health. Lifestyle, including: Alcohol, nicotine or tobacco, and drug use. Access to firearms. Diet, exercise, and sleep habits. Work and work environment. Sunscreen use. Safety issues such as seatbelt and bike helmet use. Physical exam Your health care provider will check your: Height and weight. These may be used to calculate your BMI (body mass index). BMI is a measurement that tells if you are at a healthy weight. Waist circumference. This measures the distance around your waistline. This measurement also tells if you are at a healthy weight and may help predict your risk of certain diseases, such as type 2 diabetes and high blood pressure. Heart rate and blood pressure. Body temperature. Skin for abnormal spots. What immunizations do I need?  Vaccines are usually given at various ages, according to a schedule. Your health care provider will recommend vaccines for you based on your age, medical history, and lifestyle or other factors, such as travel or where you work. What tests do I need? Screening Your health care provider may recommend screening tests for certain conditions. This may include: Lipid and cholesterol levels. Hepatitis C test. Hepatitis B test. HIV (human immunodeficiency virus) test. STI (sexually transmitted infection) testing, if you are at  risk. Lung cancer screening. Colorectal cancer screening. Diabetes screening. This is done by checking your blood sugar (glucose) after you have not eaten for a while (fasting). Mammogram. Talk with your health care provider about how often you should have regular mammograms. BRCA-related cancer screening. This may be done if you have a family history of breast, ovarian, tubal, or peritoneal cancers. Bone density scan. This is done to screen for osteoporosis. Talk with your health care provider about your test results, treatment options, and if necessary, the need for more tests. Follow these instructions at home: Eating and drinking  Eat a diet that includes fresh fruits and vegetables, whole grains, lean protein, and low-fat dairy products. Limit your intake of foods with high amounts of sugar, saturated fats, and salt. Take vitamin and mineral supplements as recommended by your health care provider. Do not drink alcohol if your health care provider tells you not to drink. If you drink alcohol: Limit how much you have to 0-1 drink a day. Know how much alcohol is in your drink. In the [place] one drink equals one 12 oz bottle of beer (355 mL), one 5 oz glass of wine (148 mL), or one 1 oz glass of hard liquor (44 mL). Lifestyle Brush your teeth every morning and night with fluoride toothpaste. Floss one time each day. Exercise for at least 30 minutes 5 or more days each week. Do not use any products that contain nicotine or tobacco. These products include cigarettes, chewing tobacco, and vaping devices, such as e-cigarettes. If you need help quitting, ask your health care provider. Do not use drugs. If you are sexually active, practice safe sex. Use a condom or other form of protection in order to prevent STIs. Take aspirin only as told by   your health care provider. Make sure that you understand how much to take and what form to take. Work with your health care provider to find out whether it  is safe and beneficial for you to take aspirin daily. Ask your health care provider if you need to take a cholesterol-lowering medicine (statin). Find healthy ways to manage stress, such as: Meditation, yoga, or listening to music. Journaling. Talking to a trusted person. Spending time with friends and family. Minimize exposure to UV radiation to reduce your risk of skin cancer. Safety Always wear your seat belt while driving or riding in a vehicle. Do not drive: If you have been drinking alcohol. Do not ride with someone who has been drinking. When you are tired or distracted. While texting. If you have been using any mind-altering substances or drugs. Wear a helmet and other protective equipment during sports activities. If you have firearms in your house, make sure you follow all gun safety procedures. What's next? Visit your health care provider once a year for an annual wellness visit. Ask your health care provider how often you should have your eyes and teeth checked. Stay up to date on all vaccines. This information is not intended to replace advice given to you by your health care provider. Make sure you discuss any questions you have with your health care provider. Document Revised: 03/12/2021 Document Reviewed: 03/12/2021 Elsevier Patient Education  2024 Elsevier Inc.  

## 2023-09-14 ENCOUNTER — Ambulatory Visit (INDEPENDENT_AMBULATORY_CARE_PROVIDER_SITE_OTHER): Payer: Medicare HMO

## 2023-09-14 VITALS — Ht 65.0 in | Wt 148.0 lb

## 2023-09-14 DIAGNOSIS — Z0000 Encounter for general adult medical examination without abnormal findings: Secondary | ICD-10-CM

## 2023-09-14 NOTE — Progress Notes (Signed)
Subjective:   Janet Knox is a 67 y.o. female who presents for Medicare Annual (Subsequent) preventive examination.  Visit Complete: Virtual I connected with  Janet Knox on 09/14/23 by a audio enabled telemedicine application and verified that I am speaking with the correct person using two identifiers.  Patient Location: Home  Provider Location: Home Office  I discussed the limitations of evaluation and management by telemedicine. The patient expressed understanding and agreed to proceed.  Vital Signs: Because this visit was a virtual/telehealth visit, some criteria may be missing or patient reported. Any vitals not documented were not able to be obtained and vitals that have been documented are patient reported.    Cardiac Risk Factors include: advanced age (>56men, >24 women);hypertension     Objective:    Today's Vitals   09/14/23 0855  Weight: 148 lb (67.1 kg)  Height: 5\' 5"  (1.651 m)   Body mass index is 24.63 kg/m.     09/14/2023    9:03 AM 09/02/2022    2:38 PM 08/12/2021    2:37 PM 07/16/2020    9:00 AM 03/23/2018   12:53 PM 08/26/2016    6:49 PM  Advanced Directives  Does Patient Have a Medical Advance Directive? No Yes Yes Yes Yes No  Type of Special educational needs teacher of [place];Living will Healthcare Power of [employer] of [facility];Living will   Copy of Healthcare Power of Attorney in Chart?  No - copy requested No - copy requested  No - copy requested   Would patient like information on creating a medical advance directive? No - Patient declined         Current Medications (verified) Outpatient Encounter Medications as of 09/14/2023  Medication Sig   aspirin 81 MG tablet Take 81 mg by mouth daily.   atorvastatin (LIPITOR) 40 MG tablet Take 1 tablet (40 mg total) by mouth daily.   Cholecalciferol (VITAMIN D3) 5000 UNITS TABS Take 1 tablet by mouth daily.    Coenzyme Q10 (COQ-10) 200 MG CAPS Take 1 capsule by mouth daily.    [employer] SUNSCREEN SPF 30 EX apply   estradiol (ESTRACE) 2 MG tablet Take 1 tablet (2 mg total) by mouth daily.   fenofibrate 54 MG tablet Take 1 tablet (54 mg total) by mouth daily.   Ferrous Sulfate 27 MG TABS Take 1 tablet by mouth daily.   metoprolol tartrate (LOPRESSOR) 50 MG tablet Take 1 tablet (50 mg total) by mouth 2 (two) times daily.   Multiple Vitamin (MULTIVITAMIN) tablet Take 1 tablet by mouth daily.   niacin 500 MG tablet Take 2 tablets (1,000 mg total) by mouth daily with breakfast.   Omega-3 Fatty Acids (FISH OIL) 1200 MG CAPS Take 1 capsule by mouth daily.    SUMAtriptan (IMITREX) 50 MG tablet TAKE 1 TABLET AS NEEDED FOR MIGRAINE, MAY REPEAT IN 2 HOURS IF HEADACHE PERSISTS OR RECURS   valsartan-hydrochlorothiazide (DIOVAN-HCT) 80-12.5 MG tablet Take 1 tablet by mouth daily.   vitamin E 400 UNIT capsule Take 400 Units by mouth daily.   No facility-administered encounter medications on file as of 09/14/2023.    Allergies (verified) Latex   History: Past Medical History:  Diagnosis Date   Anemia    Hyperlipidemia    Hypertension    Skin cancer    Past Surgical History:  Procedure Laterality Date   ABDOMINAL HYSTERECTOMY     EYE SURGERY Left    Torn Retina   KNEE SURGERY  Family History  Problem Relation Age of Onset   Alzheimer's disease Mother    Hyperlipidemia Mother    Hypertension Mother    Coronary artery disease Paternal Grandfather    Stroke Paternal Grandfather    Hyperlipidemia Father    Hypertension Father    Atrial fibrillation Father    Social History   Socioeconomic History   Marital status: Married    Spouse name: Not on file   Number of children: Not on file   Years of education: Not on file   Highest education level: 12th grade  Occupational History   Occupation: [employer]  Tobacco Use   Smoking status: Never   Smokeless tobacco: Never  Vaping Use   Vaping status: Never Used  Substance and Sexual Activity   Alcohol use:  Yes    Alcohol/week: 1.0 standard drink of alcohol    Types: 1 Glasses of wine per week   Drug use: No   Sexual activity: Yes    Partners: Male  Other Topics Concern   Not on file  Social History Narrative   Exercise-- 3 x a week for 1 hour   Social Drivers of Corporate investment banker Strain: Low Risk  (09/14/2023)   Overall Financial Resource Strain (CARDIA)    Difficulty of Paying Living Expenses: Not hard at all  Food Insecurity: No Food Insecurity (09/14/2023)   Hunger Vital Sign    Worried About Running Out of Food in the Last Year: Never true    Ran Out of Food in the Last Year: Never true  Transportation Needs: No Transportation Needs (09/14/2023)   PRAPARE - Administrator, Civil Service (Medical): No    Lack of Transportation (Non-Medical): No  Physical Activity: Inactive (09/14/2023)   Exercise Vital Sign    Days of Exercise per Week: 0 days    Minutes of Exercise per Session: 0 min  Stress: No Stress Concern Present (09/14/2023)   [employer] of Occupational Health - Occupational Stress Questionnaire    Feeling of Stress : Not at all  Social Connections: Socially Integrated (09/14/2023)   Social Connection and Isolation Panel [NHANES]    Frequency of Communication with Friends and Family: More than three times a week    Frequency of Social Gatherings with Friends and Family: More than three times a week    Attends Religious Services: More than 4 times per year    Active Member of [employer] or Organizations: Yes    Attends Engineer, structural: More than 4 times per year    Marital Status: Married    Tobacco Counseling Counseling given: Not Answered   Clinical Intake:  Pre-visit preparation completed: Yes  Pain : No/denies pain     BMI - recorded: 24.63 Nutritional Status: BMI of 19-24  Normal Nutritional Risks: None Diabetes: No  How often do you need to have someone help you when you read instructions, pamphlets, or other  written materials from your doctor or pharmacy?: 1 - Never  Interpreter Needed?: No  Information entered by :: Theresa Mulligan LPN   Activities of Daily Living    09/14/2023    9:02 AM  In your present state of health, do you have any difficulty performing the following activities:  Hearing? 0  Vision? 0  Difficulty concentrating or making decisions? 0  Walking or climbing stairs? 0  Dressing or bathing? 0  Doing errands, shopping? 0  Preparing Food and eating ? N  Using the Toilet?  N  In the past six months, have you accidently leaked urine? N  Do you have problems with loss of bowel control? N  Managing your Medications? N  Managing your Finances? N  Housekeeping or managing your Housekeeping? N    Patient Care Team: Zola Button, Grayling Congress, DO as PCP - General Marykay Lex, MD as PCP - Cardiology (Cardiology) Freddy Finner, MD (Inactive) as Consulting Physician (Obstetrics and Gynecology) Stephannie Li, MD as Consulting Physician (Ophthalmology) Antonietta Barcelona, OD as Referring Physician (Optometry) Mia Creek, MD as Consulting Physician (Ophthalmology)  Indicate any recent Medical Services you may have received from other than [facility] providers in the past year (date may be approximate).     Assessment:   This is a routine wellness examination for Janet Knox.  Hearing/Vision screen Hearing Screening - Comments:: Denies hearing difficulties   Vision Screening - Comments:: Wears rx glasses - up to date with routine eye exams with  [facility]   Goals Addressed               This Visit's Progress     Patient Stated (pt-stated)        Lose weight!       Depression Screen    09/14/2023    9:01 AM 08/06/2023    8:23 AM 09/02/2022    2:36 PM 08/12/2021    2:36 PM 07/16/2020   10:44 AM 04/30/2020    3:40 PM 01/13/2018    8:34 AM  PHQ 2/9 Scores  PHQ - 2 Score 0 0 0 0 0 0 0  PHQ- 9 Score     0      Fall Risk    09/14/2023    9:02 AM  08/06/2023    8:23 AM 09/02/2022    2:39 PM 08/12/2021    2:39 PM 07/16/2020   10:44 AM  Fall Risk   Falls in the past year? 0 0 0 1 0  Number falls in past yr: 0 0 0 1 0  Injury with Fall? 0 0 0 1 0  Comment    bruise   Risk for fall due to : No Fall Risks  Impaired vision    Follow up Falls prevention discussed Falls evaluation completed Falls prevention discussed Falls prevention discussed Falls evaluation completed    MEDICARE RISK AT HOME: Medicare Risk at Home Any stairs in or around the home?: No If so, are there any without handrails?: No Home free of loose throw rugs in walkways, pet beds, electrical cords, etc?: Yes Adequate lighting in your home to reduce risk of falls?: Yes Life alert?: No Use of a cane, walker or w/c?: No Grab bars in the bathroom?: No Shower chair or bench in shower?: Yes Elevated toilet seat or a handicapped toilet?: No  TIMED UP AND GO:  Was the test performed?  No    Cognitive Function:    07/16/2020   10:46 AM  MMSE - Mini Mental State Exam  Orientation to time 5  Orientation to Place 5  Registration 3  Attention/ Calculation 5  Recall 3  Language- name 2 objects 2  Language- repeat 1  Language- follow 3 step command 3  Language- read & follow direction 1  Write a sentence 1  Copy design 1  Total score 30        09/14/2023    9:03 AM 09/02/2022    2:40 PM 08/12/2021    2:40 PM  6CIT Screen  What Year? 0 points 0 points 0 points  What month? 0 points 0 points 0 points  What time? 0 points 0 points 0 points  Count back from 20 0 points 0 points 0 points  Months in reverse 0 points 0 points 0 points  Repeat phrase 0 points 0 points 0 points  Total Score 0 points 0 points 0 points    Immunizations Immunization History  Administered Date(s) Administered   Fluad Quad(high Dose 65+) 06/30/2021, 08/05/2022   Fluad Trivalent(High Dose 65+) 08/06/2023   Influenza Whole 07/26/2007, 07/20/2008   Influenza,inj,Quad PF,6+ Mos  07/26/2015, 06/23/2016, 07/15/2017, 08/05/2018, 06/21/2019, 07/16/2020   Influenza-Unspecified 07/26/2015, 06/23/2016, 07/15/2017   Moderna Covid-19 Vaccine Bivalent Booster 9yrs & up 07/25/2021   Moderna SARS-COV2 Booster Vaccination 08/26/2020   Moderna Sars-Covid-2 Vaccination 12/09/2019, 01/09/2020   PNEUMOCOCCAL CONJUGATE-20 02/02/2023   Pneumococcal Polysaccharide-23 12/31/2015, 12/27/2020   Rsv, Bivalent, Protein Subunit Rsvpref,pf Verdis Frederickson) 01/28/2023   Td 11/03/2002   Tdap 03/22/2013, 01/28/2023   Zoster Recombinant(Shingrix) 01/05/2017, 07/01/2017   Zoster, Live 08/13/2015   Zoster, Unspecified 01/05/2017, 07/01/2017    TDAP status: Up to date  Flu Vaccine status: Up to date  Pneumococcal vaccine status: Up to date  Covid-19 vaccine status: Declined, Education has been provided regarding the importance of this vaccine but patient still declined. Advised may receive this vaccine at local pharmacy or Health Dept.or vaccine clinic. Aware to provide a copy of the vaccination record if obtained from local pharmacy or Health Dept. Verbalized acceptance and understanding.  Qualifies for Shingles Vaccine? Yes   Zostavax completed Yes   Shingrix Completed?: Yes  Screening Tests Health Maintenance  Topic Date Due   COVID-19 Vaccine (4 - 2024-25 season) 05/30/2023   MAMMOGRAM  03/02/2024   Medicare Annual Wellness (AWV)  09/13/2024   DEXA SCAN  03/02/2025   Colonoscopy  09/24/2025   DTaP/Tdap/Td (4 - Td or Tdap) 01/27/2033   Pneumonia Vaccine 33+ Years old  Completed   INFLUENZA VACCINE  Completed   Hepatitis C Screening  Completed   Zoster Vaccines- Shingrix  Completed   HPV VACCINES  Aged Out    Health Maintenance  Health Maintenance Due  Topic Date Due   COVID-19 Vaccine (4 - 2024-25 season) 05/30/2023    Colorectal cancer screening: Type of screening: Colonoscopy. Completed 09/25/15. Repeat every 10 years  Mammogram status: Completed 03/03/23. Repeat every  year  Bone Density status: Completed 03/03/23. Results reflect: Bone density results: NORMAL. Repeat every   years.     Additional Screening:  Hepatitis C Screening: does qualify; Completed 07/26/15  Vision Screening: Recommended annual ophthalmology exams for early detection of glaucoma and other disorders of the eye. Is the patient up to date with their annual eye exam?  Yes  Who is the provider or what is the name of the office in which the patient attends annual eye exams? [facility] If pt is not established with a provider, would they like to be referred to a provider to establish care? No .   Dental Screening: Recommended annual dental exams for proper oral hygiene   Community Resource Referral / Chronic Care Management:  CRR required this visit?  No   CCM required this visit?  No     Plan:     I have personally reviewed and noted the following in the patient's chart:   Medical and social history Use of alcohol, tobacco or illicit drugs  Current medications and supplements including opioid prescriptions. Patient is not currently  taking opioid prescriptions. Functional ability and status Nutritional status Physical activity Advanced directives List of other physicians Hospitalizations, surgeries, and ER visits in previous 12 months Vitals Screenings to include cognitive, depression, and falls Referrals and appointments  In addition, I have reviewed and discussed with patient certain preventive protocols, quality metrics, and best practice recommendations. A written personalized care plan for preventive services as well as general preventive health recommendations were provided to patient.     Tillie Rung, LPN   [id number]   After Visit Summary: (MyChart) Due to this being a telephonic visit, the after visit summary with patients personalized plan was offered to patient via MyChart   Nurse Notes: None

## 2023-09-14 NOTE — Patient Instructions (Addendum)
[name] , Thank you for taking time to come for your Medicare Wellness Visit. I appreciate your ongoing commitment to your health goals. Please review the following plan we discussed and let me know if I can assist you in the future.   Referrals/Orders/Follow-Ups/Clinician Recommendations:   This is a list of the screening recommended for you and due dates:  Health Maintenance  Topic Date Due   COVID-19 Vaccine (4 - 2024-25 season) 05/30/2023   Mammogram  03/02/2024   Medicare Annual Wellness Visit  09/13/2024   DEXA scan (bone density measurement)  03/02/2025   Colon Cancer Screening  09/24/2025   DTaP/Tdap/Td vaccine (4 - Td or Tdap) 01/27/2033   Pneumonia Vaccine  Completed   Flu Shot  Completed   Hepatitis C Screening  Completed   Zoster (Shingles) Vaccine  Completed   HPV Vaccine  Aged Out    Advanced directives: (Declined) Advance directive discussed with you today. Even though you declined this today, please call our office should you change your mind, and we can give you the proper paperwork for you to fill out.  Next Medicare Annual Wellness Visit scheduled for next year: Yes

## 2023-10-30 DIAGNOSIS — J1089 Influenza due to other identified influenza virus with other manifestations: Secondary | ICD-10-CM | POA: Diagnosis not present

## 2023-10-30 DIAGNOSIS — R051 Acute cough: Secondary | ICD-10-CM | POA: Diagnosis not present

## 2023-10-30 DIAGNOSIS — R0981 Nasal congestion: Secondary | ICD-10-CM | POA: Diagnosis not present

## 2023-11-03 ENCOUNTER — Encounter: Payer: Self-pay | Admitting: Family Medicine

## 2023-11-21 ENCOUNTER — Other Ambulatory Visit: Payer: Self-pay | Admitting: Family Medicine

## 2023-11-21 DIAGNOSIS — I10 Essential (primary) hypertension: Secondary | ICD-10-CM

## 2023-11-26 DIAGNOSIS — H40013 Open angle with borderline findings, low risk, bilateral: Secondary | ICD-10-CM | POA: Diagnosis not present

## 2023-12-01 ENCOUNTER — Other Ambulatory Visit: Payer: Self-pay | Admitting: Family Medicine

## 2023-12-01 DIAGNOSIS — I10 Essential (primary) hypertension: Secondary | ICD-10-CM

## 2023-12-02 ENCOUNTER — Other Ambulatory Visit: Payer: Self-pay | Admitting: *Deleted

## 2023-12-02 DIAGNOSIS — I773 Arterial fibromuscular dysplasia: Secondary | ICD-10-CM

## 2023-12-05 NOTE — Progress Notes (Unsigned)
 [facility]   ASSESSMENT / PLAN: 68 y.o. 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 She should continue ASA 81mg  PO QD for this indefinitely. Follow up with me for repeat carotid duplex in 6 months.   CHIEF COMPLAINT: FMD of carotid arteries  HISTORY OF PRESENT ILLNESS: Janet Knox is a 68 y.o. female referred to clinic for evaluation of fibromuscular dysplasia of bilateral carotid arteries.  The patient was evaluated by her primary care physician and a carotid artery duplex performed.  This revealed evidence of fibromuscular dysplasia.  A CT angiogram was then performed.  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  The patient is asymptomatic from a neurologic standpoint.  She specifically denies unilateral weakness, numbness, difficulty speaking, difficulty swallowing, facial droop.  Spent the majority of the visit reviewing the natural history of fibromuscular dysplasia and the rationale for surveillance and treatment.  12/01/22: patient returns to clinic. No neurologic complaints. We reviewed her carotid duplex.  12/07/23: pateint returns for surveillance. Doing well overall. She is watching her grandchild a few days a week which is tiring, but enjoyable. No neurologic complaints. We reviewed her duplex.   VASCULAR SURGICAL HISTORY: none  VASCULAR RISK FACTORS: Negative history of stroke / transient ischemic attack. Negative history of coronary artery disease.  Negative history of diabetes mellitus.  Negative history of smoking.  Positive history of hypertension.  Negative history of chronic kidney disease.  Negative history of chronic obstructive pulmonary disease.  FUNCTIONAL STATUS: ECOG performance status: (0) Fully active, able to carry on all predisease performance without restriction Ambulatory status:  Ambulatory within the community without limits  Past Medical History:  Diagnosis Date   Anemia    Hyperlipidemia    Hypertension    Skin cancer     Past Surgical History:  Procedure Laterality Date   ABDOMINAL HYSTERECTOMY     EYE SURGERY Left    Torn Retina   KNEE SURGERY      Family History  Problem Relation Age of Onset   Alzheimer's disease Mother    Hyperlipidemia Mother    Hypertension Mother    Coronary artery disease Paternal Grandfather    Stroke Paternal Grandfather    Hyperlipidemia Father    Hypertension Father    Atrial fibrillation Father     Social History   Socioeconomic History   Marital status: Married    Spouse name: Not on file   Number of children: Not on file   Years of education: Not on file   Highest education level: 12th grade  Occupational History   Occupation: [employer]  Tobacco Use   Smoking status: Never   Smokeless tobacco: Never  Vaping Use   Vaping status: Never Used  Substance and Sexual Activity   Alcohol use: Yes    Alcohol/week: 1.0 standard drink of alcohol    Types: 1 Glasses of wine per week   Drug use: No   Sexual activity: Yes    Partners: Male  Other Topics Concern   Not on file  Social History Narrative   Exercise-- 3 x a week for 1 hour   Social Drivers of Health   Financial Resource Strain: Low Risk  (09/14/2023)   Overall Financial Resource Strain (CARDIA)    Difficulty of Paying Living Expenses: Not hard at all  Food Insecurity: No Food Insecurity (09/14/2023)   Hunger Vital Sign  Worried About Programme researcher, broadcasting/film/video in the Last Year: Never true    Ran Out of Food in the Last Year: Never true  Transportation Needs: No Transportation Needs (09/14/2023)   PRAPARE - Administrator, Civil Service (Medical): No    Lack of Transportation (Non-Medical): No  Physical Activity: Inactive (09/14/2023)   Exercise Vital Sign    Days of Exercise per Week: 0 days    Minutes of Exercise per  Session: 0 min  Stress: No Stress Concern Present (09/14/2023)   [employer] of Occupational Health - Occupational Stress Questionnaire    Feeling of Stress : Not at all  Social Connections: Socially Integrated (09/14/2023)   Social Connection and Isolation Panel [NHANES]    Frequency of Communication with Friends and Family: More than three times a week    Frequency of Social Gatherings with Friends and Family: More than three times a week    Attends Religious Services: More than 4 times per year    Active Member of [employer] or Organizations: Yes    Attends Engineer, structural: More than 4 times per year    Marital Status: Married  Catering manager Violence: Not At Risk (09/14/2023)   Humiliation, Afraid, Rape, and Kick questionnaire    Fear of Current or Ex-Partner: No    Emotionally Abused: No    Physically Abused: No    Sexually Abused: No    Allergies  Allergen Reactions   Latex Rash    Current Outpatient Medications  Medication Sig Dispense Refill   aspirin 81 MG tablet Take 81 mg by mouth daily.     atorvastatin (LIPITOR) 40 MG tablet Take 1 tablet (40 mg total) by mouth daily. 90 tablet 3   Cholecalciferol (VITAMIN D3) 5000 UNITS TABS Take 1 tablet by mouth daily.      Coenzyme Q10 (COQ-10) 200 MG CAPS Take 1 capsule by mouth daily.     [employer] SUNSCREEN SPF 30 EX apply     estradiol (ESTRACE) 2 MG tablet Take 1 tablet (2 mg total) by mouth daily. 90 tablet 3   fenofibrate 54 MG tablet Take 1 tablet (54 mg total) by mouth daily. 90 tablet 3   Ferrous Sulfate 27 MG TABS Take 1 tablet by mouth daily.     metoprolol tartrate (LOPRESSOR) 50 MG tablet TAKE 1 TABLET TWICE DAILY 180 tablet 1   Multiple Vitamin (MULTIVITAMIN) tablet Take 1 tablet by mouth daily.     niacin 500 MG tablet Take 2 tablets (1,000 mg total) by mouth daily with breakfast. 180 tablet 3   Omega-3 Fatty Acids (FISH OIL) 1200 MG CAPS Take 1 capsule by mouth daily.      SUMAtriptan (IMITREX) 50 MG  tablet TAKE 1 TABLET AS NEEDED FOR MIGRAINE, MAY REPEAT IN 2 HOURS IF HEADACHE PERSISTS OR RECURS 30 tablet 0   valsartan-hydrochlorothiazide (DIOVAN-HCT) 80-12.5 MG tablet TAKE 1 TABLET EVERY DAY 90 tablet 1   vitamin E 400 UNIT capsule Take 400 Units by mouth daily.     No current facility-administered medications for this visit.    PHYSICAL EXAM There were no vitals filed for this visit.    Constitutional: well appearing. no distress. Appears well nourished.  Neurologic: CN intact. no focal findings. Normal gait and station. Psychiatric:  Mood and affect symmetric and appropriate. Eyes:  No icterus. No conjunctival pallor. Ears, nose, throat:  mucous membranes moist. Midline trachea.  Cardiac: regular rate and rhythm.  Respiratory:  unlabored. Abdominal:  not distended..  Extremity: no edema. no cyanosis. no pallor.   PERTINENT LABORATORY AND RADIOLOGIC DATA  Most recent CBC    Latest Ref Rng & Units 08/06/2023    8:49 AM 02/02/2023    9:38 AM 07/22/2022    7:55 AM  CBC  WBC 4.0 - 10.5 K/uL 4.4  4.8  4.2   Hemoglobin 12.0 - 15.0 g/dL 03.4  74.2  59.5   Hematocrit 36.0 - 46.0 % 37.3  36.1  35.7   Platelets 150.0 - 400.0 K/uL 279.0  261.0  259.0      Most recent CMP    Latest Ref Rng & Units 08/06/2023    8:49 AM 02/02/2023    9:38 AM 07/22/2022    7:55 AM  CMP  Glucose 70 - 99 mg/dL 97  89  89   BUN 6 - 23 mg/dL 19  14  18    Creatinine 0.40 - 1.20 mg/dL 6.38  7.56  4.33   Sodium 135 - 145 mEq/L 140  141  140   Potassium 3.5 - 5.1 mEq/L 4.6  4.4  4.6   Chloride 96 - 112 mEq/L 104  102  105   CO2 19 - 32 mEq/L 28  29  28    Calcium 8.4 - 10.5 mg/dL 9.5  9.1  9.2   Total Protein 6.0 - 8.3 g/dL 6.7  6.7  6.7   Total Bilirubin 0.2 - 1.2 mg/dL 0.5  0.5  0.6   Alkaline Phos 39 - 117 U/L 46  49  43   AST 0 - 37 U/L 21  25  23    ALT 0 - 35 U/L 15  17  16      Renal function CrCl cannot be calculated (Patient's most recent lab result is older than the maximum 21 days  allowed.).  No results found for: "HGBA1C"  LDL Cholesterol (Calc)  Date Value Ref Range Status  07/16/2020 93 mg/dL (calc) Final    Comment:    Reference range: <100 . Desirable range <100 mg/dL for primary prevention;   <70 mg/dL for patients with CHD or diabetic patients  with > or = 2 CHD risk factors. Marland Kitchen LDL-C is now calculated using the Martin-Hopkins  calculation, which is a validated novel method providing  better accuracy than the Friedewald equation in the  estimation of LDL-C.  Horald Pollen et al. Lenox Ahr. 2951;884(16): 2061-2068  (http://education.[facility].com/faq/FAQ164)    LDL Cholesterol  Date Value Ref Range Status  08/06/2023 108 (H) 0 - 99 mg/dL Final   Direct LDL  Date Value Ref Range Status  01/27/2010 117.5 mg/dL Final    Comment:    See lab report for associated comment(s)     Vascular Imaging: CT angiogram of head and neck 01/06/22. CT evidence of fibromuscular dysplasia in bilateral internal carotid arteries.  Her distal left internal carotid artery near the skull base has a nearly 1 cm fusiform aneurysm without associated mural thrombus.  Carotid Duplex 12/07/23: 60-79% stenosis reported bilaterally. This area corresponds to area of known stenosis on prior CT scan c/w FMD.   Janet Brunt. Lenell Antu, MD [facility] Phone Number: [phone] 12/05/2023 2:32 PM  Total time spent on preparing this encounter including chart review, data review, collecting history, examining the patient, coordinating care for this established patient, 30 minutes.   Portions of this report may have been transcribed using voice recognition software.  Every effort has been made to ensure accuracy; however, inadvertent  computerized transcription errors may still be present.

## 2023-12-07 ENCOUNTER — Encounter: Payer: Self-pay | Admitting: Vascular Surgery

## 2023-12-07 ENCOUNTER — Ambulatory Visit (HOSPITAL_COMMUNITY)
Admission: RE | Admit: 2023-12-07 | Discharge: 2023-12-07 | Disposition: A | Discharge status: Home / Self Care | Payer: Medicare HMO | Source: Ambulatory Visit | Attending: Vascular Surgery | Admitting: Vascular Surgery

## 2023-12-07 ENCOUNTER — Ambulatory Visit: Payer: Medicare HMO | Admitting: Vascular Surgery

## 2023-12-07 VITALS — BP 125/77 | HR 58 | Temp 98.0°F | Ht 65.0 in | Wt 149.0 lb

## 2023-12-07 DIAGNOSIS — I773 Arterial fibromuscular dysplasia: Secondary | ICD-10-CM

## 2023-12-10 ENCOUNTER — Other Ambulatory Visit: Payer: Self-pay | Admitting: *Deleted

## 2023-12-10 DIAGNOSIS — I773 Arterial fibromuscular dysplasia: Secondary | ICD-10-CM

## 2023-12-18 ENCOUNTER — Other Ambulatory Visit: Payer: Self-pay | Admitting: Family Medicine

## 2023-12-18 DIAGNOSIS — E785 Hyperlipidemia, unspecified: Secondary | ICD-10-CM

## 2023-12-18 DIAGNOSIS — Z0000 Encounter for general adult medical examination without abnormal findings: Secondary | ICD-10-CM

## 2023-12-18 DIAGNOSIS — Z780 Asymptomatic menopausal state: Secondary | ICD-10-CM

## 2023-12-23 ENCOUNTER — Telehealth: Payer: Self-pay

## 2023-12-23 NOTE — Telephone Encounter (Signed)
 Patient asked question in survey re: if there were any food/supplements that would help with the buildup of plaque in her carotid.  This nurse responded to her question.   'Hi Janet Knox! That is a great question about food/supplements.  We will always say a diet with plenty of fruits and vegetables is great.     Carotid artery stenosis is caused by a build up of plaque.  Plaque formation is caused by many factors but if you're looking specifically at diet, low saturated fats can help prevent further build up.  All animal products (milk, meats, butter, cheese, seafood, etc.) have saturated fat and some plants do as well (nuts, coconut and coconut oil, etc).   I hope this helps and if you need more assistance, I'm sure Dr. Lenell Antu would entertain a conversation and possibly a recommendation to a dietician.     Thanks! Marchelle Folks, RN"

## 2024-01-20 DIAGNOSIS — D489 Neoplasm of uncertain behavior, unspecified: Secondary | ICD-10-CM | POA: Diagnosis not present

## 2024-01-20 DIAGNOSIS — Z85828 Personal history of other malignant neoplasm of skin: Secondary | ICD-10-CM | POA: Diagnosis not present

## 2024-01-20 DIAGNOSIS — L814 Other melanin hyperpigmentation: Secondary | ICD-10-CM | POA: Diagnosis not present

## 2024-01-20 DIAGNOSIS — L821 Other seborrheic keratosis: Secondary | ICD-10-CM | POA: Diagnosis not present

## 2024-01-20 DIAGNOSIS — C44212 Basal cell carcinoma of skin of right ear and external auricular canal: Secondary | ICD-10-CM | POA: Diagnosis not present

## 2024-01-20 DIAGNOSIS — C44219 Basal cell carcinoma of skin of left ear and external auricular canal: Secondary | ICD-10-CM | POA: Diagnosis not present

## 2024-01-20 DIAGNOSIS — L578 Other skin changes due to chronic exposure to nonionizing radiation: Secondary | ICD-10-CM | POA: Diagnosis not present

## 2024-02-03 ENCOUNTER — Ambulatory Visit: Payer: Medicare HMO | Admitting: Family Medicine

## 2024-02-03 ENCOUNTER — Encounter: Payer: Self-pay | Admitting: Family Medicine

## 2024-02-03 VITALS — BP 104/80 | HR 67 | Temp 98.6°F | Resp 18 | Ht 65.0 in | Wt 145.4 lb

## 2024-02-03 DIAGNOSIS — I10 Essential (primary) hypertension: Secondary | ICD-10-CM | POA: Diagnosis not present

## 2024-02-03 DIAGNOSIS — E559 Vitamin D deficiency, unspecified: Secondary | ICD-10-CM

## 2024-02-03 DIAGNOSIS — E041 Nontoxic single thyroid nodule: Secondary | ICD-10-CM | POA: Diagnosis not present

## 2024-02-03 DIAGNOSIS — E785 Hyperlipidemia, unspecified: Secondary | ICD-10-CM

## 2024-02-03 DIAGNOSIS — Z0000 Encounter for general adult medical examination without abnormal findings: Secondary | ICD-10-CM

## 2024-02-03 LAB — COMPREHENSIVE METABOLIC PANEL WITH GFR
ALT: 16 U/L (ref 0–35)
AST: 24 U/L (ref 0–37)
Albumin: 4.4 g/dL (ref 3.5–5.2)
Alkaline Phosphatase: 53 U/L (ref 39–117)
BUN: 18 mg/dL (ref 6–23)
CO2: 26 meq/L (ref 19–32)
Calcium: 9.6 mg/dL (ref 8.4–10.5)
Chloride: 106 meq/L (ref 96–112)
Creatinine, Ser: 0.74 mg/dL (ref 0.40–1.20)
GFR: 83.06 mL/min (ref >60.00)
Glucose, Bld: 97 mg/dL (ref 70–99)
Potassium: 5.3 meq/L — ABNORMAL HIGH (ref 3.5–5.1)
Sodium: 142 meq/L (ref 135–145)
Total Bilirubin: 0.4 mg/dL (ref 0.2–1.2)
Total Protein: 6.8 g/dL (ref 6.0–8.3)

## 2024-02-03 LAB — CBC WITH DIFFERENTIAL/PLATELET
Basophils Absolute: 0 10*3/uL (ref 0.0–0.1)
Basophils Relative: 0.6 % (ref 0.0–3.0)
Eosinophils Absolute: 0.1 10*3/uL (ref 0.0–0.7)
Eosinophils Relative: 2.4 % (ref 0.0–5.0)
HCT: 35.9 % — ABNORMAL LOW (ref 36.0–46.0)
Hemoglobin: 12.2 g/dL (ref 12.0–15.0)
Lymphocytes Relative: 31.2 % (ref 12.0–46.0)
Lymphs Abs: 1.2 10*3/uL (ref 0.7–4.0)
MCHC: 33.9 g/dL (ref 30.0–36.0)
MCV: 95.6 fl (ref 78.0–100.0)
Monocytes Absolute: 0.4 10*3/uL (ref 0.1–1.0)
Monocytes Relative: 9.8 % (ref 3.0–12.0)
Neutro Abs: 2.2 10*3/uL (ref 1.4–7.7)
Neutrophils Relative %: 56 % (ref 43.0–77.0)
Platelets: 269 10*3/uL (ref 150.0–400.0)
RBC: 3.75 Mil/uL — ABNORMAL LOW (ref 3.87–5.11)
RDW: 13.4 % (ref 11.5–15.5)
WBC: 4 10*3/uL (ref 4.0–10.5)

## 2024-02-03 LAB — LIPID PANEL
Cholesterol: 187 mg/dL (ref 0–200)
HDL: 62.8 mg/dL (ref >39.00)
LDL Cholesterol: 108 mg/dL — ABNORMAL HIGH (ref 0–99)
NonHDL: 124.39
Total CHOL/HDL Ratio: 3
Triglycerides: 84 mg/dL (ref 0.0–149.0)
VLDL: 16.8 mg/dL (ref 0.0–40.0)

## 2024-02-03 LAB — TSH: TSH: 3.03 u[IU]/mL (ref 0.35–5.50)

## 2024-02-03 MED ORDER — METOPROLOL TARTRATE 50 MG PO TABS: 50.0000 mg | ORAL_TABLET | Freq: Two times a day (BID) | ORAL | 1 refills | Status: DC

## 2024-02-03 MED ORDER — VALSARTAN-HYDROCHLOROTHIAZIDE 80-12.5 MG PO TABS: 1.0000 | ORAL_TABLET | Freq: Every day | ORAL | 1 refills | Status: DC

## 2024-02-03 MED ORDER — ATORVASTATIN CALCIUM 40 MG PO TABS: 40.0000 mg | ORAL_TABLET | Freq: Every day | ORAL | 1 refills | Status: DC

## 2024-02-03 MED ORDER — FENOFIBRATE 54 MG PO TABS: 54.0000 mg | ORAL_TABLET | Freq: Every day | ORAL | 1 refills | Status: DC

## 2024-02-03 NOTE — Assessment & Plan Note (Signed)
 Tolerating statin, encouraged heart healthy diet, avoid trans fats, minimize simple carbs and saturated fats. Increase exercise as tolerated

## 2024-02-03 NOTE — Progress Notes (Signed)
 *+                                                                                                                                                                                                                                                                                                                                                                                                                                                                                                                                          Established Patient Office Visit  Subjective   Patient ID: Janet Knox, female    DOB: Feb 26, 1956  Age: 68 y.o. MRN: [id number]  Chief Complaint  Patient presents with   Hypertension   Hyperlipidemia   Follow-up    HPI Discussed the use of AI scribe software for clinical note transcription with the patient, who gave verbal consent to proceed.  History of Present Illness Janet Knox "[name]" is a 68 year  old female with chronic back pain who presents for evaluation of her back pain and medication management.  She experiences chronic back pain, described as a 'pinch of nerve' in the lower left back and hip area. The pain is exacerbated by activities such as carrying her nine-month-old granddaughter, whom she cares for two days a week. It is not consistently radiating down her leg. She uses Advil for pain relief, but did not take it on the morning of the visit, resulting in increased discomfort. She also uses heat therapy at night.  She is currently taking iron supplements due to anemia, which she believes contributes to her constipation. To manage this, she has started taking Colace daily, although it is only partially effective. She is considering trying Senokot as an alternative.  She has a history of basal cell carcinoma, with occurrences every year, and visits the dermatologist every six months. She has had Mohs surgery in the past, including a procedure that went through  her ear, requiring skin grafting from the back of her ear. She is vigilant about monitoring her skin for new lesions.  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 Her physical activity is limited due to her schedule.   Patient Active Problem List   Diagnosis Date Noted   Pharyngitis 04/27/2023   Chest pain 10/13/2022   Bilateral serous otitis media 06/11/2021   Multiple thyroid  nodules 02/04/2018   History of migraine 02/04/2018   Preventative health care 01/05/2017   SKIN CANCER, HX OF 01/27/2010   Hyperlipidemia LDL goal <70 10/27/2006   ANEMIA-NOS 10/27/2006   Essential hypertension 10/27/2006   INSOMNIA 10/27/2006   Past Medical History:  Diagnosis Date   Anemia    Hyperlipidemia    Hypertension    Skin cancer    Past Surgical History:  Procedure Laterality Date   ABDOMINAL HYSTERECTOMY     EYE SURGERY Left    Torn Retina   KNEE SURGERY     Social History   Tobacco Use   Smoking status: Never   Smokeless tobacco: Never  Vaping Use   Vaping status: Never Used  Substance Use Topics   Alcohol use: Yes    Alcohol/week: 1.0 standard drink of alcohol    Types: 1 Glasses of wine per week   Drug use: No   Social History   Socioeconomic History   Marital status: Married    Spouse name: Not on file   Number of children: Not on file   Years of education: Not on file   Highest education level: 12th grade  Occupational History   Occupation: [employer]  Tobacco Use   Smoking status: Never   Smokeless tobacco: Never  Vaping Use   Vaping status: Never Used  Substance and Sexual Activity   Alcohol use: Yes    Alcohol/week: 1.0 standard drink of alcohol    Types: 1 Glasses of wine per week   Drug use: No   Sexual activity: Yes    Partners: Male  Other Topics Concern   Not on file  Social History Narrative   Exercise-- 3 x a week for 1 hour    Social Drivers of Health   Financial Resource Strain: Low Risk  (01/27/2024)   Overall Financial Resource Strain (CARDIA)    Difficulty of Paying Living Expenses: Not hard at all  Food Insecurity: No Food Insecurity (01/27/2024)  Hunger Vital Sign    Worried About Running Out of Food in the Last Year: Never true    Ran Out of Food in the Last Year: Never true  Transportation Needs: No Transportation Needs (01/27/2024)   PRAPARE - Administrator, Civil Service (Medical): No    Lack of Transportation (Non-Medical): No  Physical Activity: Insufficiently Active (01/27/2024)   Exercise Vital Sign    Days of Exercise per Week: 4 days    Minutes of Exercise per Session: 30 min  Stress: No Stress Concern Present (01/27/2024)   [employer] of Occupational Health - Occupational Stress Questionnaire    Feeling of Stress : Not at all  Social Connections: Socially Integrated (01/27/2024)   Social Connection and Isolation Panel [NHANES]    Frequency of Communication with Friends and Family: More than three times a week    Frequency of Social Gatherings with Friends and Family: Once a week    Attends Religious Services: More than 4 times per year    Active Member of [employer] or Organizations: Yes    Attends Engineer, structural: More than 4 times per year    Marital Status: Married  Catering manager Violence: Not At Risk (09/14/2023)   Humiliation, Afraid, Rape, and Kick questionnaire    Fear of Current or Ex-Partner: No    Emotionally Abused: No    Physically Abused: No    Sexually Abused: No   Family Status  Relation Name Status   Mother  Deceased at age 26   PGF  Deceased   Father  Alive  No partnership data on file   Family History  Problem Relation Age of Onset   Alzheimer's disease Mother    Hyperlipidemia Mother    Hypertension Mother    Coronary artery disease Paternal Grandfather    Stroke Paternal Grandfather    Hyperlipidemia Father    Hypertension Father     Atrial fibrillation Father    Allergies  Allergen Reactions   Latex Rash      Review of Systems  Constitutional:  Negative for fever and malaise/fatigue.  HENT:  Negative for congestion.   Eyes:  Negative for blurred vision.  Respiratory:  Negative for shortness of breath.   Cardiovascular:  Negative for chest pain, palpitations and leg swelling.  Gastrointestinal:  Negative for abdominal pain, blood in stool and nausea.  Genitourinary:  Negative for dysuria and frequency.  Musculoskeletal:  Negative for falls.  Skin:  Negative for rash.  Neurological:  Negative for dizziness, loss of consciousness and headaches.  Endo/Heme/Allergies:  Negative for environmental allergies.  Psychiatric/Behavioral:  Negative for depression. The patient is not nervous/anxious.       Objective:     BP 104/80 (BP Location: Left Arm, Patient Position: Sitting, Cuff Size: Normal)   Pulse 67   Temp 98.6 F (37 C) (Oral)   Resp 18   Ht 5\' 5"  (1.651 m)   Wt 145 lb 6.4 oz (66 kg)   SpO2 96%   BMI 24.20 kg/m  BP Readings from Last 3 Encounters:  02/03/24 104/80  12/07/23 125/77  08/06/23 112/60   Wt Readings from Last 3 Encounters:  02/03/24 145 lb 6.4 oz (66 kg)  12/07/23 149 lb (67.6 kg)  09/14/23 148 lb (67.1 kg)   SpO2 Readings from Last 3 Encounters:  02/03/24 96%  12/07/23 100%  08/06/23 97%      Physical Exam Vitals and nursing note reviewed.  Constitutional:  General: She is not in acute distress.    Appearance: Normal appearance. She is well-developed.  HENT:     Head: Normocephalic and atraumatic.     Right Ear: Tympanic membrane, ear canal and external ear normal. There is no impacted cerumen.     Left Ear: Tympanic membrane, ear canal and external ear normal. There is no impacted cerumen.     Nose: Nose normal.     Mouth/Throat:     Mouth: Mucous membranes are moist.     Pharynx: Oropharynx is clear. No oropharyngeal exudate or posterior oropharyngeal  erythema.  Eyes:     General: No scleral icterus.       Right eye: No discharge.        Left eye: No discharge.     Conjunctiva/sclera: Conjunctivae normal.     Pupils: Pupils are equal, round, and reactive to light.  Neck:     Thyroid : No thyromegaly or thyroid  tenderness.     Vascular: No JVD.  Cardiovascular:     Rate and Rhythm: Normal rate and regular rhythm.     Heart sounds: Normal heart sounds. No murmur heard. Pulmonary:     Effort: Pulmonary effort is normal. No respiratory distress.     Breath sounds: Normal breath sounds.  Abdominal:     General: Bowel sounds are normal. There is no distension.     Palpations: Abdomen is soft. There is no mass.     Tenderness: There is no abdominal tenderness. There is no guarding or rebound.  Musculoskeletal:        General: Normal range of motion.     Cervical back: Normal range of motion and neck supple.     Right lower leg: No edema.     Left lower leg: No edema.  Lymphadenopathy:     Cervical: No cervical adenopathy.  Skin:    General: Skin is warm and dry.     Findings: No erythema or rash.  Neurological:     Mental Status: She is alert and oriented to person, place, and time.     Cranial Nerves: No cranial nerve deficit.     Deep Tendon Reflexes: Reflexes are normal and symmetric.  Psychiatric:        Mood and Affect: Mood normal.        Behavior: Behavior normal.        Thought Content: Thought content normal.        Judgment: Judgment normal.      No results found for any visits on 02/03/24.  Last CBC Lab Results  Component Value Date   WBC 4.4 08/06/2023   HGB 12.3 08/06/2023   HCT 37.3 08/06/2023   MCV 95.6 08/06/2023   MCH 31.8 08/26/2016   RDW 13.3 08/06/2023   PLT 279.0 08/06/2023   Last metabolic panel Lab Results  Component Value Date   GLUCOSE 97 08/06/2023   NA 140 08/06/2023   K 4.6 08/06/2023   CL 104 08/06/2023   CO2 28 08/06/2023   BUN 19 08/06/2023   CREATININE 0.73 08/06/2023   GFR  84.72 08/06/2023   CALCIUM  9.5 08/06/2023   PROT 6.7 08/06/2023   ALBUMIN 4.2 08/06/2023   BILITOT 0.5 08/06/2023   ALKPHOS 46 08/06/2023   AST 21 08/06/2023   ALT 15 08/06/2023   ANIONGAP 10 08/26/2016   Last lipids Lab Results  Component Value Date   CHOL 198 08/06/2023   HDL 63.20 08/06/2023   LDLCALC 108 (H) 08/06/2023  LDLDIRECT 117.5 01/27/2010   TRIG 130.0 08/06/2023   CHOLHDL 3 08/06/2023   Last hemoglobin A1c No results found for: "HGBA1C" Last thyroid  functions Lab Results  Component Value Date   TSH 3.63 08/06/2023   T4TOTAL 9.3 06/30/2021   Last vitamin D  Lab Results  Component Value Date   VD25OH 42.22 08/06/2023   Last vitamin B12 and Folate Lab Results  Component Value Date   VITAMINB12 642 01/27/2010   FOLATE 19.0 01/27/2010      The 10-year ASCVD risk score (Arnett DK, et al., 2019) is: 6.6%    Assessment & Plan:   Problem List Items Addressed This Visit       Unprioritized   Preventative health care   Relevant Medications   fenofibrate  54 MG tablet   Hyperlipidemia LDL goal <70 (Chronic)   Tolerating statin, encouraged heart healthy diet, avoid trans fats, minimize simple carbs and saturated fats. Increase exercise as tolerated       Relevant Medications   atorvastatin  (LIPITOR) 40 MG tablet   fenofibrate  54 MG tablet   metoprolol  tartrate (LOPRESSOR ) 50 MG tablet   valsartan -hydrochlorothiazide  (DIOVAN -HCT) 80-12.5 MG tablet   Essential hypertension - Primary (Chronic)   Well controlled, no changes to meds. Encouraged heart healthy diet such as the DASH diet and exercise as tolerated.        Relevant Medications   atorvastatin  (LIPITOR) 40 MG tablet   fenofibrate  54 MG tablet   metoprolol  tartrate (LOPRESSOR ) 50 MG tablet   valsartan -hydrochlorothiazide  (DIOVAN -HCT) 80-12.5 MG tablet   Other Relevant Orders   CBC with Differential/Platelet   Other Visit Diagnoses       Hyperlipidemia, unspecified hyperlipidemia type        Relevant Medications   atorvastatin  (LIPITOR) 40 MG tablet   fenofibrate  54 MG tablet   metoprolol  tartrate (LOPRESSOR ) 50 MG tablet   valsartan -hydrochlorothiazide  (DIOVAN -HCT) 80-12.5 MG tablet   Other Relevant Orders   Comprehensive metabolic panel with GFR   Lipid panel     Thyroid  nodule       Relevant Medications   metoprolol  tartrate (LOPRESSOR ) 50 MG tablet   Other Relevant Orders   TSH     Vitamin D  deficiency         Assessment and Plan Assessment & Plan Back pain   Chronic back pain since January, primarily in the lower left back and hip, worsens with activities like lifting her granddaughter. Currently managed with Advil, but she seeks additional strategies. Discussed benefits of core strengthening exercises, ice therapy, and chiropractic care. Advised against long-term NSAID use due to potential renal and hypertensive effects. Plan includes considering core strengthening exercises, trying ice therapy, and exploring chiropractic or massage therapy.  Constipation due to iron supplements   Constipation likely due to iron supplementation for anemia. Currently taking Colace with limited relief. Discussed alternative options such as Senokot or Dulcolax, which may be more effective. Plan to consider switching to Senokot or Dulcolax for better management.  Basal cell carcinoma   Basal cell carcinoma with regular dermatology follow-ups every six months. Recent concern about a spot in the ear, monitored by the dermatologist. She remains vigilant about sun protection and regular skin checks. Plan to continue regular dermatology follow-ups and monitor any new or changing skin lesions.  Wellness Visit   Routine wellness visit shows well-controlled blood pressure. Mammogram is due in June, while bone density and colonoscopy are up to date. Discussed skin cancer history and regular dermatology follow-ups. She remains vigilant about sun  protection and regular skin checks. Plan to schedule  a mammogram for June.    No follow-ups on file.    Ziaire Bieser R Lowne Chase, DO

## 2024-02-03 NOTE — Assessment & Plan Note (Signed)
 Well controlled, no changes to meds. Encouraged heart healthy diet such as the DASH diet and exercise as tolerated.

## 2024-02-03 NOTE — Assessment & Plan Note (Signed)
 Ghm utd Check labs  See AVS  Health Maintenance  Topic Date Due   COVID-19 Vaccine (4 - 2024-25 season) 05/30/2023   MAMMOGRAM  03/02/2024   INFLUENZA VACCINE  04/28/2024   Medicare Annual Wellness (AWV)  09/13/2024   DEXA SCAN  03/02/2025   Colonoscopy  09/24/2025   DTaP/Tdap/Td (4 - Td or Tdap) 01/27/2033   Pneumonia Vaccine 65+ Years old  Completed   Hepatitis C Screening  Completed   Zoster Vaccines- Shingrix   Completed   HPV VACCINES  Aged Out   Meningococcal B Vaccine  Aged Out

## 2024-02-08 ENCOUNTER — Ambulatory Visit: Payer: Self-pay | Admitting: Family Medicine

## 2024-02-16 ENCOUNTER — Encounter: Payer: Self-pay | Admitting: Cardiology

## 2024-03-09 DIAGNOSIS — R070 Pain in throat: Secondary | ICD-10-CM | POA: Diagnosis not present

## 2024-03-29 DIAGNOSIS — C44219 Basal cell carcinoma of skin of left ear and external auricular canal: Secondary | ICD-10-CM | POA: Diagnosis not present

## 2024-04-04 ENCOUNTER — Other Ambulatory Visit (HOSPITAL_BASED_OUTPATIENT_CLINIC_OR_DEPARTMENT_OTHER): Payer: Self-pay | Admitting: Family Medicine

## 2024-04-04 DIAGNOSIS — Z1231 Encounter for screening mammogram for malignant neoplasm of breast: Secondary | ICD-10-CM

## 2024-04-18 ENCOUNTER — Ambulatory Visit (HOSPITAL_BASED_OUTPATIENT_CLINIC_OR_DEPARTMENT_OTHER)
Admission: RE | Admit: 2024-04-18 | Discharge: 2024-04-18 | Disposition: A | Discharge status: Home / Self Care | Source: Ambulatory Visit | Attending: Family Medicine | Admitting: Family Medicine

## 2024-04-18 ENCOUNTER — Encounter (HOSPITAL_BASED_OUTPATIENT_CLINIC_OR_DEPARTMENT_OTHER): Payer: Self-pay

## 2024-04-18 DIAGNOSIS — Z1231 Encounter for screening mammogram for malignant neoplasm of breast: Secondary | ICD-10-CM | POA: Diagnosis not present

## 2024-05-10 ENCOUNTER — Other Ambulatory Visit: Payer: Self-pay | Admitting: Family Medicine

## 2024-05-10 DIAGNOSIS — Z780 Asymptomatic menopausal state: Secondary | ICD-10-CM

## 2024-05-10 DIAGNOSIS — Z0000 Encounter for general adult medical examination without abnormal findings: Secondary | ICD-10-CM

## 2024-06-05 NOTE — Progress Notes (Unsigned)
 [facility] [place]  ASSESSMENT / PLAN: 68 y.o. 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 She should continue ASA 81mg  PO QD for this indefinitely. Follow up with me for repeat carotid duplex in 6 months.   CHIEF COMPLAINT: FMD of carotid arteries  HISTORY OF PRESENT ILLNESS: Janet Knox is a 68 y.o. female referred to clinic for evaluation of fibromuscular dysplasia of bilateral carotid arteries.  The patient was evaluated by her primary care physician and a carotid artery duplex performed.  This revealed evidence of fibromuscular dysplasia.  A CT angiogram was then performed.  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  The patient is asymptomatic from a neurologic standpoint.  She specifically denies unilateral weakness, numbness, difficulty speaking, difficulty swallowing, facial droop.  Spent the majority of the visit reviewing the natural history of fibromuscular dysplasia and the rationale for surveillance and treatment.  12/01/22: patient returns to clinic. No neurologic complaints. We reviewed her carotid duplex.  12/07/23: pateint returns for surveillance. Doing well overall. She is watching her grandchild a few days a week which is tiring, but enjoyable. No neurologic complaints. We reviewed her duplex.   VASCULAR SURGICAL HISTORY: none  VASCULAR RISK FACTORS: Negative history of stroke / transient ischemic attack. Negative history of coronary artery disease.  Negative history of diabetes mellitus.  Negative history of smoking.  Positive history of hypertension.  Negative history of chronic kidney disease.  Negative history of chronic obstructive pulmonary disease.  FUNCTIONAL STATUS: ECOG performance status: (0) Fully active, able to carry on all predisease performance without restriction Ambulatory status:  Ambulatory within the community without limits  Past Medical History:  Diagnosis Date   Anemia    Hyperlipidemia    Hypertension    Skin cancer     Past Surgical History:  Procedure Laterality Date   ABDOMINAL HYSTERECTOMY     EYE SURGERY Left    Torn Retina   KNEE SURGERY      Family History  Problem Relation Age of Onset   Alzheimer's disease Mother    Hyperlipidemia Mother    Hypertension Mother    Coronary artery disease Paternal Grandfather    Stroke Paternal Grandfather    Hyperlipidemia Father    Hypertension Father    Atrial fibrillation Father     Social History   Socioeconomic History   Marital status: Married    Spouse name: Not on file   Number of children: Not on file   Years of education: Not on file   Highest education level: 12th grade  Occupational History   Occupation: [employer]  Tobacco Use   Smoking status: Never   Smokeless tobacco: Never  Vaping Use   Vaping status: Never Used  Substance and Sexual Activity   Alcohol use: Yes    Alcohol/week: 1.0 standard drink of alcohol    Types: 1 Glasses of wine per week   Drug use: No   Sexual activity: Yes    Partners: Male  Other Topics Concern   Not on file  Social History Narrative   Exercise-- 3 x a week for 1 hour   Social Drivers of Health   Financial Resource Strain: Low Risk  (01/27/2024)   Overall Financial Resource Strain (CARDIA)    Difficulty of Paying Living Expenses: Not hard at all  Food Insecurity: No Food Insecurity (01/27/2024)   Hunger Vital Sign  Worried About Programme researcher, broadcasting/film/video in the Last Year: Never true    Ran Out of Food in the Last Year: Never true  Transportation Needs: No Transportation Needs (01/27/2024)   PRAPARE - Administrator, Civil Service (Medical): No    Lack of Transportation (Non-Medical): No  Physical Activity: Insufficiently Active (01/27/2024)   Exercise Vital Sign    Days of Exercise per Week: 4 days    Minutes of Exercise per  Session: 30 min  Stress: No Stress Concern Present (01/27/2024)   [employer] of Occupational Health - Occupational Stress Questionnaire    Feeling of Stress : Not at all  Social Connections: Socially Integrated (01/27/2024)   Social Connection and Isolation Panel    Frequency of Communication with Friends and Family: More than three times a week    Frequency of Social Gatherings with Friends and Family: Once a week    Attends Religious Services: More than 4 times per year    Active Member of [employer] or Organizations: Yes    Attends Engineer, structural: More than 4 times per year    Marital Status: Married  Catering manager Violence: Not At Risk (09/14/2023)   Humiliation, Afraid, Rape, and Kick questionnaire    Fear of Current or Ex-Partner: No    Emotionally Abused: No    Physically Abused: No    Sexually Abused: No    Allergies  Allergen Reactions   Latex Rash    Current Outpatient Medications  Medication Sig Dispense Refill   aspirin  81 MG tablet Take 81 mg by mouth daily.     atorvastatin  (LIPITOR) 40 MG tablet Take 1 tablet (40 mg total) by mouth daily. 90 tablet 1   Cholecalciferol  (VITAMIN D3) 5000 UNITS TABS Take 1 tablet by mouth daily.      Coenzyme Q10 (COQ-10) 200 MG CAPS Take 1 capsule by mouth daily.     [employer]S SUNSCREEN SPF 30 EX apply     estradiol  (ESTRACE ) 2 MG tablet TAKE 1 TABLET EVERY DAY 90 tablet 3   fenofibrate  54 MG tablet Take 1 tablet (54 mg total) by mouth daily. 90 tablet 1   Ferrous Sulfate 27 MG TABS Take 1 tablet by mouth daily.     metoprolol  tartrate (LOPRESSOR ) 50 MG tablet Take 1 tablet (50 mg total) by mouth 2 (two) times daily. 180 tablet 1   Multiple Vitamin (MULTIVITAMIN) tablet Take 1 tablet by mouth daily.     niacin  500 MG tablet Take 2 tablets (1,000 mg total) by mouth daily with breakfast. 180 tablet 3   Omega-3 Fatty Acids (FISH OIL) 1200 MG CAPS Take 1 capsule by mouth daily.      SUMAtriptan  (IMITREX ) 50 MG tablet TAKE 1  TABLET AS NEEDED FOR MIGRAINE, MAY REPEAT IN 2 HOURS IF HEADACHE PERSISTS OR RECURS 30 tablet 0   valsartan -hydrochlorothiazide  (DIOVAN -HCT) 80-12.5 MG tablet Take 1 tablet by mouth daily. 90 tablet 1   vitamin E 400 UNIT capsule Take 400 Units by mouth daily.     No current facility-administered medications for this visit.    PHYSICAL EXAM There were no vitals filed for this visit.    Constitutional: well appearing. no distress. Appears well nourished.  Neurologic: CN intact. no focal findings. Normal gait and station. Psychiatric:  Mood and affect symmetric and appropriate. Eyes:  No icterus. No conjunctival pallor. Ears, nose, throat:  mucous membranes moist. Midline trachea.  Cardiac: regular rate and rhythm.  Respiratory:  unlabored.  Abdominal: not distended..  Extremity: no edema. no cyanosis. no pallor.   PERTINENT LABORATORY AND RADIOLOGIC DATA  Most recent CBC    Latest Ref Rng & Units 02/03/2024    9:00 AM 08/06/2023    8:49 AM 02/02/2023    9:38 AM  CBC  WBC 4.0 - 10.5 K/uL 4.0  4.4  4.8   Hemoglobin 12.0 - 15.0 g/dL 87.7  87.6  87.5   Hematocrit 36.0 - 46.0 % 35.9  37.3  36.1   Platelets 150.0 - 400.0 K/uL 269.0  279.0  261.0      Most recent CMP    Latest Ref Rng & Units 02/03/2024    9:00 AM 08/06/2023    8:49 AM 02/02/2023    9:38 AM  CMP  Glucose 70 - 99 mg/dL 97  97  89   BUN 6 - 23 mg/dL 18  19  14    Creatinine 0.40 - 1.20 mg/dL 9.25  9.26  9.36   Sodium 135 - 145 mEq/L 142  140  141   Potassium 3.5 - 5.1 mEq/L 5.3 No hemolysis seen  4.6  4.4   Chloride 96 - 112 mEq/L 106  104  102   CO2 19 - 32 mEq/L 26  28  29    Calcium  8.4 - 10.5 mg/dL 9.6  9.5  9.1   Total Protein 6.0 - 8.3 g/dL 6.8  6.7  6.7   Total Bilirubin 0.2 - 1.2 mg/dL 0.4  0.5  0.5   Alkaline Phos 39 - 117 U/L 53  46  49   AST 0 - 37 U/L 24  21  25    ALT 0 - 35 U/L 16  15  17      Renal function CrCl cannot be calculated (Patient's most recent lab result is older than the maximum 21 days  allowed.).  No results found for: HGBA1C  LDL Cholesterol (Calc)  Date Value Ref Range Status  07/16/2020 93 mg/dL (calc) Final    Comment:    Reference range: <100 . Desirable range <100 mg/dL for primary prevention;   <70 mg/dL for patients with CHD or diabetic patients  with > or = 2 CHD risk factors. SABRA LDL-C is now calculated using the Martin-Hopkins  calculation, which is a validated novel method providing  better accuracy than the Friedewald equation in the  estimation of LDL-C.  Gladis APPLETHWAITE et al. SANDREA. 7986;689(80): 2061-2068  (http://education.[facility].com/faq/FAQ164)    LDL Cholesterol  Date Value Ref Range Status  02/03/2024 108 (H) 0 - 99 mg/dL Final   Direct LDL  Date Value Ref Range Status  01/27/2010 117.5 mg/dL Final    Comment:    See lab report for associated comment(s)     Vascular Imaging: CT angiogram of head and neck 01/06/22. CT evidence of fibromuscular dysplasia in bilateral internal carotid arteries.  Her distal left internal carotid artery near the skull base has a nearly 1 cm fusiform aneurysm without associated mural thrombus.  Carotid Duplex 12/07/23: 60-79% stenosis reported bilaterally. This area corresponds to area of known stenosis on prior CT scan c/w FMD.   Debby SAILOR. Magda, MD [facility] Phone Number: [phone] 06/05/2024 8:01 PM  Total time spent on preparing this encounter including chart review, data review, collecting history, examining the patient, coordinating care for this established patient, 30 minutes.   Portions of this report may have been transcribed using voice recognition software.  Every effort has been made to  ensure accuracy; however, inadvertent computerized transcription errors may still be present.

## 2024-06-06 ENCOUNTER — Ambulatory Visit: Attending: Vascular Surgery | Admitting: Vascular Surgery

## 2024-06-06 ENCOUNTER — Encounter: Payer: Self-pay | Admitting: Vascular Surgery

## 2024-06-06 ENCOUNTER — Ambulatory Visit (HOSPITAL_COMMUNITY)
Admission: RE | Admit: 2024-06-06 | Discharge: 2024-06-06 | Disposition: A | Discharge status: Home / Self Care | Source: Ambulatory Visit | Attending: Vascular Surgery | Admitting: Vascular Surgery

## 2024-06-06 VITALS — BP 138/75 | HR 90 | Temp 97.9°F | Ht 65.0 in | Wt 145.1 lb

## 2024-06-06 DIAGNOSIS — I773 Arterial fibromuscular dysplasia: Secondary | ICD-10-CM | POA: Insufficient documentation

## 2024-07-02 ENCOUNTER — Other Ambulatory Visit: Payer: Self-pay | Admitting: Family Medicine

## 2024-07-02 DIAGNOSIS — I10 Essential (primary) hypertension: Secondary | ICD-10-CM

## 2024-07-20 DIAGNOSIS — H43811 Vitreous degeneration, right eye: Secondary | ICD-10-CM | POA: Diagnosis not present

## 2024-07-20 DIAGNOSIS — H40013 Open angle with borderline findings, low risk, bilateral: Secondary | ICD-10-CM | POA: Diagnosis not present

## 2024-07-20 DIAGNOSIS — Z961 Presence of intraocular lens: Secondary | ICD-10-CM | POA: Diagnosis not present

## 2024-07-20 DIAGNOSIS — H524 Presbyopia: Secondary | ICD-10-CM | POA: Diagnosis not present

## 2024-07-20 DIAGNOSIS — H35352 Cystoid macular degeneration, left eye: Secondary | ICD-10-CM | POA: Diagnosis not present

## 2024-07-20 DIAGNOSIS — H52223 Regular astigmatism, bilateral: Secondary | ICD-10-CM | POA: Diagnosis not present

## 2024-07-23 ENCOUNTER — Other Ambulatory Visit: Payer: Self-pay | Admitting: Family Medicine

## 2024-07-23 DIAGNOSIS — Z0000 Encounter for general adult medical examination without abnormal findings: Secondary | ICD-10-CM

## 2024-07-23 DIAGNOSIS — E785 Hyperlipidemia, unspecified: Secondary | ICD-10-CM

## 2024-07-25 DIAGNOSIS — H43811 Vitreous degeneration, right eye: Secondary | ICD-10-CM | POA: Diagnosis not present

## 2024-07-25 DIAGNOSIS — H31092 Other chorioretinal scars, left eye: Secondary | ICD-10-CM | POA: Diagnosis not present

## 2024-08-05 DIAGNOSIS — I10 Essential (primary) hypertension: Secondary | ICD-10-CM | POA: Diagnosis not present

## 2024-08-05 DIAGNOSIS — E785 Hyperlipidemia, unspecified: Secondary | ICD-10-CM | POA: Diagnosis not present

## 2024-08-05 DIAGNOSIS — Z7982 Long term (current) use of aspirin: Secondary | ICD-10-CM | POA: Diagnosis not present

## 2024-08-05 DIAGNOSIS — Z8612 Personal history of poliomyelitis: Secondary | ICD-10-CM | POA: Diagnosis not present

## 2024-08-05 DIAGNOSIS — I720 Aneurysm of carotid artery: Secondary | ICD-10-CM | POA: Diagnosis not present

## 2024-08-05 DIAGNOSIS — E049 Nontoxic goiter, unspecified: Secondary | ICD-10-CM | POA: Diagnosis not present

## 2024-08-05 DIAGNOSIS — Z85828 Personal history of other malignant neoplasm of skin: Secondary | ICD-10-CM | POA: Diagnosis not present

## 2024-08-10 DIAGNOSIS — L738 Other specified follicular disorders: Secondary | ICD-10-CM | POA: Diagnosis not present

## 2024-08-10 DIAGNOSIS — C44319 Basal cell carcinoma of skin of other parts of face: Secondary | ICD-10-CM | POA: Diagnosis not present

## 2024-08-10 DIAGNOSIS — L821 Other seborrheic keratosis: Secondary | ICD-10-CM | POA: Diagnosis not present

## 2024-08-10 DIAGNOSIS — L578 Other skin changes due to chronic exposure to nonionizing radiation: Secondary | ICD-10-CM | POA: Diagnosis not present

## 2024-08-10 DIAGNOSIS — L814 Other melanin hyperpigmentation: Secondary | ICD-10-CM | POA: Diagnosis not present

## 2024-08-10 DIAGNOSIS — D489 Neoplasm of uncertain behavior, unspecified: Secondary | ICD-10-CM | POA: Diagnosis not present

## 2024-08-10 DIAGNOSIS — D1801 Hemangioma of skin and subcutaneous tissue: Secondary | ICD-10-CM | POA: Diagnosis not present

## 2024-08-10 DIAGNOSIS — Z85828 Personal history of other malignant neoplasm of skin: Secondary | ICD-10-CM | POA: Diagnosis not present

## 2024-08-11 ENCOUNTER — Encounter: Payer: Self-pay | Admitting: Family Medicine

## 2024-08-11 ENCOUNTER — Ambulatory Visit (HOSPITAL_BASED_OUTPATIENT_CLINIC_OR_DEPARTMENT_OTHER)
Admission: RE | Admit: 2024-08-11 | Discharge: 2024-08-11 | Disposition: A | Discharge status: Home / Self Care | Source: Ambulatory Visit | Attending: Family Medicine | Admitting: Family Medicine

## 2024-08-11 ENCOUNTER — Ambulatory Visit: Admitting: Family Medicine

## 2024-08-11 VITALS — BP 120/80 | HR 57 | Temp 98.2°F | Resp 18 | Ht 65.0 in | Wt 142.8 lb

## 2024-08-11 DIAGNOSIS — I10 Essential (primary) hypertension: Secondary | ICD-10-CM | POA: Diagnosis not present

## 2024-08-11 DIAGNOSIS — E041 Nontoxic single thyroid nodule: Secondary | ICD-10-CM

## 2024-08-11 DIAGNOSIS — M4316 Spondylolisthesis, lumbar region: Secondary | ICD-10-CM | POA: Diagnosis not present

## 2024-08-11 DIAGNOSIS — E785 Hyperlipidemia, unspecified: Secondary | ICD-10-CM

## 2024-08-11 DIAGNOSIS — M25552 Pain in left hip: Secondary | ICD-10-CM

## 2024-08-11 DIAGNOSIS — Z23 Encounter for immunization: Secondary | ICD-10-CM | POA: Diagnosis not present

## 2024-08-11 DIAGNOSIS — R079 Chest pain, unspecified: Secondary | ICD-10-CM

## 2024-08-11 DIAGNOSIS — M48061 Spinal stenosis, lumbar region without neurogenic claudication: Secondary | ICD-10-CM | POA: Diagnosis not present

## 2024-08-11 DIAGNOSIS — M47816 Spondylosis without myelopathy or radiculopathy, lumbar region: Secondary | ICD-10-CM | POA: Diagnosis not present

## 2024-08-11 DIAGNOSIS — E559 Vitamin D deficiency, unspecified: Secondary | ICD-10-CM

## 2024-08-11 LAB — LIPID PANEL
Cholesterol: 167 mg/dL (ref 0–200)
HDL: 65.9 mg/dL (ref >39.00)
LDL Cholesterol: 85 mg/dL (ref 0–99)
NonHDL: 100.82
Total CHOL/HDL Ratio: 3
Triglycerides: 78 mg/dL (ref 0.0–149.0)
VLDL: 15.6 mg/dL (ref 0.0–40.0)

## 2024-08-11 LAB — VITAMIN D 25 HYDROXY (VIT D DEFICIENCY, FRACTURES): VITD: 50.73 ng/mL (ref 30.00–100.00)

## 2024-08-11 LAB — CBC WITH DIFFERENTIAL/PLATELET
Basophils Absolute: 0.1 K/uL (ref 0.0–0.1)
Basophils Relative: 1.3 % (ref 0.0–3.0)
Eosinophils Absolute: 0.1 K/uL (ref 0.0–0.7)
Eosinophils Relative: 2.8 % (ref 0.0–5.0)
HCT: 35.8 % — ABNORMAL LOW (ref 36.0–46.0)
Hemoglobin: 12.2 g/dL (ref 12.0–15.0)
Lymphocytes Relative: 31.3 % (ref 12.0–46.0)
Lymphs Abs: 1.3 K/uL (ref 0.7–4.0)
MCHC: 34 g/dL (ref 30.0–36.0)
MCV: 93.9 fl (ref 78.0–100.0)
Monocytes Absolute: 0.4 K/uL (ref 0.1–1.0)
Monocytes Relative: 10 % (ref 3.0–12.0)
Neutro Abs: 2.2 K/uL (ref 1.4–7.7)
Neutrophils Relative %: 54.6 % (ref 43.0–77.0)
Platelets: 263 K/uL (ref 150.0–400.0)
RBC: 3.82 Mil/uL — ABNORMAL LOW (ref 3.87–5.11)
RDW: 13.4 % (ref 11.5–15.5)
WBC: 4.1 K/uL (ref 4.0–10.5)

## 2024-08-11 LAB — COMPREHENSIVE METABOLIC PANEL WITH GFR
ALT: 16 U/L (ref 0–35)
AST: 22 U/L (ref 0–37)
Albumin: 4.3 g/dL (ref 3.5–5.2)
Alkaline Phosphatase: 51 U/L (ref 39–117)
BUN: 19 mg/dL (ref 6–23)
CO2: 29 meq/L (ref 19–32)
Calcium: 9.7 mg/dL (ref 8.4–10.5)
Chloride: 104 meq/L (ref 96–112)
Creatinine, Ser: 0.66 mg/dL (ref 0.40–1.20)
GFR: 89.73 mL/min (ref >60.00)
Glucose, Bld: 88 mg/dL (ref 70–99)
Potassium: 5.2 meq/L — ABNORMAL HIGH (ref 3.5–5.1)
Sodium: 140 meq/L (ref 135–145)
Total Bilirubin: 0.7 mg/dL (ref 0.2–1.2)
Total Protein: 6.7 g/dL (ref 6.0–8.3)

## 2024-08-11 LAB — TSH: TSH: 2.71 u[IU]/mL (ref 0.35–5.50)

## 2024-08-11 NOTE — Assessment & Plan Note (Signed)
 Encourage heart healthy diet such as MIND or DASH diet, increase exercise, avoid trans fats, simple carbohydrates and processed foods, consider a krill or fish or flaxseed oil cap daily.

## 2024-08-11 NOTE — Progress Notes (Signed)
 Subjective:    Patient ID: Janet Knox, female    DOB: 1955-10-09, 68 y.o.   MRN: [id number]  Chief Complaint  Patient presents with   Hypertension   Hyperlipidemia   Follow-up    HPI Patient is in today for f/u bp, chol .   Discussed the use of AI scribe software for clinical note transcription with the patient, who gave verbal consent to proceed.  History of Present Illness Janet Knox is a 68 year old female who presents with left-sided hip and leg pain.  She has been experiencing left-sided hip and leg pain for almost a year, radiating down to her ankle and described as sciatic in nature. The pain occurs after prolonged sitting or upon waking, but not during her work hours, where she works ten-hour days, three days a week. She takes Advil in the morning and at night, which alleviates the pain, and uses a heating pad for relief.  The pain began when she started caring for her granddaughter, often laying on the floor beside her and carrying her up and down stairs. She denies any back pain but notes soreness after prolonged activity. Stretching the affected side is painful.  She has a history of a hiatal hernia, associated with stress, causing pressure build-up but no current pain, managed with antacids. She also has a history of migraines, previously treated with Imitrex , now managed with Excedrin Migraine. Migraines typically occur at night and are associated with a slightly bulging disc in her neck, which she believes is aggravated by improper pillow positioning.  She had COVID-19 at the end of September to early October, with mild symptoms and no fever or weakness. She has not received a COVID vaccine recently.    Past Medical History:  Diagnosis Date   Anemia    Hyperlipidemia    Hypertension    Skin cancer     Past Surgical History:  Procedure Laterality Date   ABDOMINAL HYSTERECTOMY     EYE SURGERY Left    Torn Retina   KNEE SURGERY      Family  History  Problem Relation Age of Onset   Alzheimer's disease Mother    Hyperlipidemia Mother    Hypertension Mother    Hyperlipidemia Father    Hypertension Father    Atrial fibrillation Father    Coronary artery disease Paternal Grandfather    Stroke Paternal Grandfather     Social History   Socioeconomic History   Marital status: Married    Spouse name: Not on file   Number of children: Not on file   Years of education: Not on file   Highest education level: 12th grade  Occupational History   Occupation: [employer]  Tobacco Use   Smoking status: Never   Smokeless tobacco: Never  Vaping Use   Vaping status: Never Used  Substance and Sexual Activity   Alcohol use: Yes    Alcohol/week: 1.0 standard drink of alcohol    Types: 1 Glasses of wine per week   Drug use: No   Sexual activity: Yes    Partners: Male  Other Topics Concern   Not on file  Social History Narrative   Exercise-- 3 x a week for 1 hour   Social Drivers of Health   Financial Resource Strain: Low Risk  (08/10/2024)   Overall Financial Resource Strain (CARDIA)    Difficulty of Paying Living Expenses: Not hard at all  Food Insecurity: No Food Insecurity (08/10/2024)   Hunger  Vital Sign    Worried About Programme Researcher, Broadcasting/film/video in the Last Year: Never true    Ran Out of Food in the Last Year: Never true  Transportation Needs: No Transportation Needs (08/10/2024)   PRAPARE - Administrator, Civil Service (Medical): No    Lack of Transportation (Non-Medical): No  Physical Activity: Insufficiently Active (08/10/2024)   Exercise Vital Sign    Days of Exercise per Week: 2 days    Minutes of Exercise per Session: 30 min  Stress: Stress Concern Present (08/10/2024)   [employer] of Occupational Health - Occupational Stress Questionnaire    Feeling of Stress: Rather much  Social Connections: Socially Integrated (08/10/2024)   Social Connection and Isolation Panel    Frequency of  Communication with Friends and Family: More than three times a week    Frequency of Social Gatherings with Friends and Family: Once a week    Attends Religious Services: More than 4 times per year    Active Member of [employer] or Organizations: Yes    Attends Banker Meetings: 1 to 4 times per year    Marital Status: Married  Catering Manager Violence: Not At Risk (09/14/2023)   Humiliation, Afraid, Rape, and Kick questionnaire    Fear of Current or Ex-Partner: No    Emotionally Abused: No    Physically Abused: No    Sexually Abused: No    Outpatient Medications Prior to Visit  Medication Sig Dispense Refill   aspirin  81 MG tablet Take 81 mg by mouth daily.     atorvastatin  (LIPITOR) 40 MG tablet Take 1 tablet (40 mg total) by mouth daily. 90 tablet 0   Cholecalciferol  (VITAMIN D3) 5000 UNITS TABS Take 1 tablet by mouth daily.      Coenzyme Q10 (COQ-10) 200 MG CAPS Take 1 capsule by mouth daily.     CVS SUNSCREEN SPF 30 EX apply     estradiol  (ESTRACE ) 2 MG tablet TAKE 1 TABLET EVERY DAY 90 tablet 3   fenofibrate  54 MG tablet Take 1 tablet (54 mg total) by mouth daily. 90 tablet 0   Ferrous Sulfate 27 MG TABS Take 1 tablet by mouth daily.     metoprolol  tartrate (LOPRESSOR ) 50 MG tablet Take 1 tablet (50 mg total) by mouth 2 (two) times daily. 180 tablet 1   Multiple Vitamin (MULTIVITAMIN) tablet Take 1 tablet by mouth daily.     niacin  500 MG tablet Take 2 tablets (1,000 mg total) by mouth daily with breakfast. 180 tablet 3   Omega-3 Fatty Acids (FISH OIL) 1200 MG CAPS Take 1 capsule by mouth daily.      valsartan -hydrochlorothiazide  (DIOVAN -HCT) 80-12.5 MG tablet Take 1 tablet by mouth daily. 90 tablet 1   vitamin E 400 UNIT capsule Take 400 Units by mouth daily.     SUMAtriptan  (IMITREX ) 50 MG tablet TAKE 1 TABLET AS NEEDED FOR MIGRAINE, MAY REPEAT IN 2 HOURS IF HEADACHE PERSISTS OR RECURS (Patient not taking: Reported on 08/11/2024) 30 tablet 0   No facility-administered  medications prior to visit.    Allergies  Allergen Reactions   Latex Rash    Review of Systems  Constitutional:  Negative for fever and malaise/fatigue.  HENT:  Negative for congestion.   Eyes:  Negative for blurred vision.  Respiratory:  Negative for shortness of breath.   Cardiovascular:  Positive for chest pain. Negative for palpitations and leg swelling.  Gastrointestinal:  Negative for abdominal pain, blood in stool  and nausea.  Genitourinary:  Negative for dysuria and frequency.  Musculoskeletal:  Negative for falls.  Skin:  Negative for rash.  Neurological:  Negative for dizziness, loss of consciousness and headaches.  Endo/Heme/Allergies:  Negative for environmental allergies.  Psychiatric/Behavioral:  Negative for depression. The patient is not nervous/anxious.        Objective:    Physical Exam Vitals and nursing note reviewed.  Constitutional:      General: She is not in acute distress.    Appearance: Normal appearance. She is well-developed.  HENT:     Head: Normocephalic and atraumatic.  Eyes:     General: No scleral icterus.       Right eye: No discharge.        Left eye: No discharge.  Cardiovascular:     Rate and Rhythm: Normal rate and regular rhythm.     Heart sounds: No murmur heard. Pulmonary:     Effort: Pulmonary effort is normal. No respiratory distress.     Breath sounds: Normal breath sounds.  Musculoskeletal:        General: Normal range of motion.     Cervical back: Normal range of motion and neck supple.     Right lower leg: No edema.     Left lower leg: No edema.  Skin:    General: Skin is warm and dry.  Neurological:     Mental Status: She is alert and oriented to person, place, and time.  Psychiatric:        Mood and Affect: Mood normal.        Behavior: Behavior normal.        Thought Content: Thought content normal.        Judgment: Judgment normal.     BP 120/80 (BP Location: Left Arm, Patient Position: Sitting, Cuff Size:  Normal)   Pulse (!) 57   Temp 98.2 F (36.8 C) (Oral)   Resp 18   Ht 5' 5 (1.651 m)   Wt 142 lb 12.8 oz (64.8 kg)   SpO2 97%   BMI 23.76 kg/m  Wt Readings from Last 3 Encounters:  08/11/24 142 lb 12.8 oz (64.8 kg)  06/06/24 145 lb 1.6 oz (65.8 kg)  02/03/24 145 lb 6.4 oz (66 kg)   EKG--  sinus bradycardia   Diabetic Foot Exam - Simple   No data filed    Lab Results  Component Value Date   WBC 4.0 02/03/2024   HGB 12.2 02/03/2024   HCT 35.9 (L) 02/03/2024   PLT 269.0 02/03/2024   GLUCOSE 97 02/03/2024   CHOL 187 02/03/2024   TRIG 84.0 02/03/2024   HDL 62.80 02/03/2024   LDLDIRECT 117.5 01/27/2010   LDLCALC 108 (H) 02/03/2024   ALT 16 02/03/2024   AST 24 02/03/2024   NA 142 02/03/2024   K 5.3 No hemolysis seen (H) 02/03/2024   CL 106 02/03/2024   CREATININE 0.74 02/03/2024   BUN 18 02/03/2024   CO2 26 02/03/2024   TSH 3.03 02/03/2024    Lab Results  Component Value Date   TSH 3.03 02/03/2024   Lab Results  Component Value Date   WBC 4.0 02/03/2024   HGB 12.2 02/03/2024   HCT 35.9 (L) 02/03/2024   MCV 95.6 02/03/2024   PLT 269.0 02/03/2024   Lab Results  Component Value Date   NA 142 02/03/2024   K 5.3 No hemolysis seen (H) 02/03/2024   CO2 26 02/03/2024   GLUCOSE 97 02/03/2024   BUN 18  02/03/2024   CREATININE 0.74 02/03/2024   BILITOT 0.4 02/03/2024   ALKPHOS 53 02/03/2024   AST 24 02/03/2024   ALT 16 02/03/2024   PROT 6.8 02/03/2024   ALBUMIN 4.4 02/03/2024   CALCIUM  9.6 02/03/2024   ANIONGAP 10 08/26/2016   GFR 83.06 02/03/2024   Lab Results  Component Value Date   CHOL 187 02/03/2024   Lab Results  Component Value Date   HDL 62.80 02/03/2024   Lab Results  Component Value Date   LDLCALC 108 (H) 02/03/2024   Lab Results  Component Value Date   TRIG 84.0 02/03/2024   Lab Results  Component Value Date   CHOLHDL 3 02/03/2024   No results found for: HGBA1C     Assessment & Plan:  Hyperlipidemia, unspecified  hyperlipidemia type  Essential hypertension Assessment & Plan: Well controlled, no changes to meds. Encouraged heart healthy diet such as the DASH diet and exercise as tolerated.    Orders: -     CBC with Differential/Platelet -     Comprehensive metabolic panel with GFR -     Lipid panel -     TSH  Thyroid  nodule -     TSH  Vitamin D  deficiency -     VITAMIN D  25 Hydroxy (Vit-D Deficiency, Fractures)  Hyperlipidemia LDL goal <70 Assessment & Plan: Encourage heart healthy diet such as MIND or DASH diet, increase exercise, avoid trans fats, simple carbohydrates and processed foods, consider a krill or fish or flaxseed oil cap daily.    Orders: -     Comprehensive metabolic panel with GFR -     Lipid panel  Need for influenza vaccination -     Flu vaccine HIGH DOSE PF(Fluzone Trivalent)  Pain of left hip -     DG Lumbar Spine Complete; Future -     DG HIP UNILAT W OR W/O PELVIS 2-3 VIEWS LEFT; Future  Chest pain, unspecified type -     EKG 12-Lead -     Ambulatory referral to Cardiology  Sinus bradycardia ---  ekg Assessment and Plan Assessment & Plan Pain radiating from left hip down left leg (sciatica vs. hip pathology)   Chronic pain radiates from the left hip down the left leg, likely due to sciatica given the radiation pattern and history of back issues. Pain relief with Advil and heat suggests a musculoskeletal origin. Although hip pathology is considered, the radiation pattern points to a back-related issue. Ordered x-ray of the back and hip to assess for disc issues and hip pathology. Recommended using ice for nerve pain and discussed potential referral to a chiropractor if needed.  Chest pain and hiatal hernia (evaluation of cardiac vs. gastrointestinal etiology)   Intermittent chest pain may relate to hiatal hernia or cardiac issues, with stress and anxiety potentially exacerbating symptoms. Previous cardiology evaluation showed no significant issues with exercise  tolerance. Emphasized not attributing all chest pain to reflux without ruling out cardiac causes. Performed EKG to evaluate cardiac function and advised continuing antacids for hiatal hernia management.  General Health Maintenance   Routine health maintenance was discussed, including vaccinations and screenings. Recent COVID-19 infection noted with mild symptoms and no fever. Emphasized the importance of flu vaccination.   Marchelle Rinella R Lowne Chase, DO

## 2024-08-11 NOTE — Patient Instructions (Signed)

## 2024-08-11 NOTE — Assessment & Plan Note (Signed)
 Well controlled, no changes to meds. Encouraged heart healthy diet such as the DASH diet and exercise as tolerated.

## 2024-08-14 ENCOUNTER — Ambulatory Visit: Payer: Self-pay | Admitting: Family Medicine

## 2024-08-14 DIAGNOSIS — M25552 Pain in left hip: Secondary | ICD-10-CM

## 2024-08-18 MED ORDER — DULOXETINE HCL 30 MG PO CPEP
30.0000 mg | ORAL_CAPSULE | Freq: Every day | ORAL | 1 refills | Status: AC
Start: 2024-08-18 — End: ?

## 2024-09-19 ENCOUNTER — Ambulatory Visit: Payer: Medicare HMO

## 2024-09-19 VITALS — Ht 65.0 in | Wt 145.0 lb

## 2024-09-19 DIAGNOSIS — Z0000 Encounter for general adult medical examination without abnormal findings: Secondary | ICD-10-CM | POA: Diagnosis not present

## 2024-09-19 NOTE — Patient Instructions (Addendum)
 Janet Knox,  Thank you for taking the time for your Medicare Wellness Visit. I appreciate your continued commitment to your health goals. Please review the care plan we discussed, and feel free to reach out if I can assist you further.  Please note that Annual Wellness Visits do not include a physical exam. Some assessments may be limited, especially if the visit was conducted virtually. If needed, we may recommend an in-person follow-up with your provider.  Ongoing Care Seeing your primary care provider every 3 to 6 months helps us  monitor your health and provide consistent, personalized care.   Referrals If a referral was made during today's visit and you haven't received any updates within two weeks, please contact the referred provider directly to check on the status.  Recommended Screenings:  Health Maintenance  Topic Date Due   COVID-19 Vaccine (4 - 2025-26 season) 05/29/2024   Osteoporosis screening with Bone Density Scan  03/02/2025   Breast Cancer Screening  04/18/2025   Medicare Annual Wellness Visit  09/19/2025   Colon Cancer Screening  09/24/2025   DTaP/Tdap/Td vaccine (4 - Td or Tdap) 01/27/2033   Pneumococcal Vaccine for age over 41  Completed   Flu Shot  Completed   Hepatitis C Screening  Completed   Zoster (Shingles) Vaccine  Completed   Meningitis B Vaccine  Aged Out       09/19/2024    9:57 AM  Advanced Directives  Does Patient Have a Medical Advance Directive? Yes  Type of Estate Agent of [place];Living will  Does patient want to make changes to medical advance directive? No - Patient declined  Copy of Healthcare Power of Attorney in Chart? No - copy requested    Vision: Annual vision screenings are recommended for early detection of glaucoma, cataracts, and diabetic retinopathy. These exams can also reveal signs of chronic conditions such as diabetes and high blood pressure.  Dental: Annual dental screenings help detect early signs  of oral cancer, gum disease, and other conditions linked to overall health, including heart disease and diabetes.  Please see the attached documents for additional preventive care recommendations.

## 2024-09-19 NOTE — Progress Notes (Signed)
 "  Chief Complaint  Patient presents with   Medicare Wellness     Subjective:   Janet Knox is a 68 y.o. female who presents for a Medicare Annual Wellness Visit.  Visit info / Clinical Intake: Medicare Wellness Visit Type:: Subsequent Annual Wellness Visit Persons participating in visit and providing information:: patient Medicare Wellness Visit Mode:: Telephone If telephone:: video declined Since this visit was completed virtually, some vitals may be partially provided or unavailable. Missing vitals are due to the limitations of the virtual format.: Documented vitals are patient reported If Telephone or Video please confirm:: I connected with patient using audio/video enable telemedicine. I verified patient identity with two identifiers, discussed telehealth limitations, and patient agreed to proceed. Patient Location:: Home Provider Location:: Office Interpreter Needed?: No Pre-visit prep was completed: yes AWV questionnaire completed by patient prior to visit?: yes Date:: 09/12/24 Living arrangements:: lives with spouse/significant other Patient's Overall Health Status Rating: very good Typical amount of pain: some Does pain affect daily life?: no Are you currently prescribed opioids?: no  Dietary Habits and Nutritional Risks How many meals a day?: 2 Eats fruit and vegetables daily?: yes Most meals are obtained by: preparing own meals In the last 2 weeks, have you had any of the following?: none Diabetic:: no  Functional Status Activities of Daily Living (to include ambulation/medication): Independent Ambulation: Independent with device- listed below Home Assistive Devices/Equipment: Eyeglasses Medication Administration: Independent Home Management (perform basic housework or laundry): Independent Manage your own finances?: yes Primary transportation is: driving Concerns about vision?: no *vision screening is required for WTM* Concerns about hearing?: no  Fall  Screening Falls in the past year?: 0 Number of falls in past year: 0 Was there an injury with Fall?: 0 Fall Risk Category Calculator: 0 Patient Fall Risk Level: Low Fall Risk  Fall Risk Patient at Risk for Falls Due to: No Fall Risks Fall risk Follow up: Falls evaluation completed  Home and Transportation Safety: All rugs have non-skid backing?: yes All stairs or steps have railings?: yes Grab bars in the bathtub or shower?: (!) no Have non-skid surface in bathtub or shower?: (!) no Good home lighting?: yes Regular seat belt use?: yes Hospital stays in the last year:: no  Cognitive Assessment Difficulty concentrating, remembering, or making decisions? : no Will 6CIT or Mini Cog be Completed: yes What year is it?: 0 points What month is it?: 0 points Give patient an address phrase to remember (5 components): [address] [place]  About what time is it?: 0 points Count backwards from 20 to 1: 0 points Say the months of the year in reverse: 0 points Repeat the address phrase from earlier: 0 points 6 CIT Score: 0 points  Advance Directives (For Healthcare) Does Patient Have a Medical Advance Directive?: Yes Does patient want to make changes to medical advance directive?: No - Patient declined Type of Advance Directive: Healthcare Power of [place]; Living will Copy of Healthcare Power of Attorney in Chart?: No - copy requested Copy of Living Will in Chart?: No - copy requested  Reviewed/Updated  Reviewed/Updated: Reviewed All (Medical, Surgical, Family, Medications, Allergies, Care Teams, Patient Goals)    Allergies (verified) Latex   Current Medications (verified) Outpatient Encounter Medications as of 09/19/2024  Medication Sig   aspirin  81 MG tablet Take 81 mg by mouth daily.   atorvastatin  (LIPITOR) 40 MG tablet Take 1 tablet (40 mg total) by mouth daily.   Cholecalciferol  (VITAMIN D3) 5000 UNITS TABS Take 1 tablet  by mouth daily.    Coenzyme Q10 (COQ-10)  200 MG CAPS Take 1 capsule by mouth daily.   [employer] SUNSCREEN SPF 30 EX apply   DULoxetine  (CYMBALTA ) 30 MG capsule Take 1 capsule (30 mg total) by mouth daily.   estradiol  (ESTRACE ) 2 MG tablet TAKE 1 TABLET EVERY DAY   fenofibrate  54 MG tablet Take 1 tablet (54 mg total) by mouth daily.   Ferrous Sulfate 27 MG TABS Take 1 tablet by mouth daily.   metoprolol  tartrate (LOPRESSOR ) 50 MG tablet Take 1 tablet (50 mg total) by mouth 2 (two) times daily.   Multiple Vitamin (MULTIVITAMIN) tablet Take 1 tablet by mouth daily.   niacin  500 MG tablet Take 2 tablets (1,000 mg total) by mouth daily with breakfast.   Omega-3 Fatty Acids (FISH OIL) 1200 MG CAPS Take 1 capsule by mouth daily.    SUMAtriptan  (IMITREX ) 50 MG tablet TAKE 1 TABLET AS NEEDED FOR MIGRAINE, MAY REPEAT IN 2 HOURS IF HEADACHE PERSISTS OR RECURS (Patient not taking: Reported on 08/11/2024)   valsartan -hydrochlorothiazide  (DIOVAN -HCT) 80-12.5 MG tablet Take 1 tablet by mouth daily.   vitamin E 400 UNIT capsule Take 400 Units by mouth daily.   No facility-administered encounter medications on file as of 09/19/2024.    History: Past Medical History:  Diagnosis Date   Anemia    Hyperlipidemia    Hypertension    Skin cancer    Past Surgical History:  Procedure Laterality Date   ABDOMINAL HYSTERECTOMY     EYE SURGERY Left    Torn Retina   KNEE SURGERY     Family History  Problem Relation Age of Onset   Alzheimer's disease Mother    Hyperlipidemia Mother    Hypertension Mother    Hyperlipidemia Father    Hypertension Father    Atrial fibrillation Father    Coronary artery disease Paternal Grandfather    Stroke Paternal Grandfather    Social History   Occupational History   Occupation: [employer]  Tobacco Use   Smoking status: Never   Smokeless tobacco: Never  Vaping Use   Vaping status: Never Used  Substance and Sexual Activity   Alcohol use: Yes    Alcohol/week: 1.0 standard drink of alcohol    Types: 1  Glasses of wine per week   Drug use: No   Sexual activity: Yes    Partners: Male   Tobacco Counseling Counseling given: No  SDOH Screenings   Food Insecurity: No Food Insecurity (09/19/2024)  Housing: Unknown (09/19/2024)  Transportation Needs: No Transportation Needs (09/19/2024)  Utilities: Not At Risk (09/19/2024)  Alcohol Screen: Low Risk (01/27/2024)  Depression (PHQ2-9): Low Risk (09/19/2024)  Financial Resource Strain: Low Risk (08/10/2024)  Physical Activity: Inactive (09/19/2024)  Social Connections: Socially Integrated (09/19/2024)  Stress: No Stress Concern Present (09/19/2024)  Recent Concern: Stress - Stress Concern Present (08/10/2024)  Tobacco Use: Low Risk (09/19/2024)  Health Literacy: Adequate Health Literacy (09/19/2024)   See flowsheets for full screening details  Depression Screen PHQ 2 & 9 Depression Scale- Over the past 2 weeks, how often have you been bothered by any of the following problems? Little interest or pleasure in doing things: 0 Feeling down, depressed, or hopeless (PHQ Adolescent also includes...irritable): 0 PHQ-2 Total Score: 0     Goals Addressed               This Visit's Progress     Increase physical activity (pt-stated)        Lose weight  Objective:    Today's Vitals   09/19/24 0953  Weight: 145 lb (65.8 kg)  Height: 5' 5 (1.651 m)   Body mass index is 24.13 kg/m.  Hearing/Vision screen Hearing Screening - Comments:: Denies hearing difficulties   Vision Screening - Comments:: Wears rx glasses - up to date with routine eye exams with  Dr Trent Immunizations and Health Maintenance Health Maintenance  Topic Date Due   COVID-19 Vaccine (4 - 2025-26 season) 05/29/2024   Bone Density Scan  03/02/2025   Mammogram  04/18/2025   Medicare Annual Wellness (AWV)  09/19/2025   Colonoscopy  09/24/2025   DTaP/Tdap/Td (4 - Td or Tdap) 01/27/2033   Pneumococcal Vaccine: 50+ Years  Completed   Influenza  Vaccine  Completed   Hepatitis C Screening  Completed   Zoster Vaccines- Shingrix   Completed   Meningococcal B Vaccine  Aged Out        Assessment/Plan:  This is a routine wellness examination for Janet Knox.  Patient Care Team: Antonio Meth, Jamee SAUNDERS, DO as PCP - General Anner Alm ORN, MD as PCP - Cardiology (Cardiology) Rosalynn ORN Ingle, MD (Inactive) as Consulting Physician (Obstetrics and Gynecology) Jarold Mayo, MD as Consulting Physician (Ophthalmology) Cleotilde Carlin Alm, OD as Referring Physician (Optometry) Lavonia Lye, MD as Consulting Physician (Ophthalmology) Lorriane Lash, Doyal FERNS, FNP (Dermatology)  I have personally reviewed and noted the following in the patients chart:   Medical and social history Use of alcohol, tobacco or illicit drugs  Current medications and supplements including opioid prescriptions. Functional ability and status Nutritional status Physical activity Advanced directives List of other physicians Hospitalizations, surgeries, and ER visits in previous 12 months Vitals Screenings to include cognitive, depression, and falls Referrals and appointments  No orders of the defined types were placed in this encounter.  In addition, I have reviewed and discussed with patient certain preventive protocols, quality metrics, and best practice recommendations. A written personalized care plan for preventive services as well as general preventive health recommendations were provided to patient.   Rojelio ORN Blush, LPN   [id number]   Return in 53 weeks (on 09/25/2025).  After Visit Summary: (MyChart) Due to this being a telephonic visit, the after visit summary with patients personalized plan was offered to patient via MyChart   Nurse Notes: No voiced or noted concerns at this time "

## 2024-10-07 ENCOUNTER — Other Ambulatory Visit: Payer: Self-pay | Admitting: Family Medicine

## 2024-10-07 DIAGNOSIS — Z0000 Encounter for general adult medical examination without abnormal findings: Secondary | ICD-10-CM

## 2024-10-07 DIAGNOSIS — E785 Hyperlipidemia, unspecified: Secondary | ICD-10-CM

## 2025-02-09 ENCOUNTER — Encounter: Admitting: Family Medicine

## 2025-09-25 ENCOUNTER — Ambulatory Visit
